# Patient Record
Sex: Male | Born: 1935
Health system: Southern US, Community
[De-identification: ages and names within clinical notes are randomized; demographics above are authoritative.]

## PROBLEM LIST (undated history)

## (undated) DIAGNOSIS — J189 Pneumonia, unspecified organism: Secondary | ICD-10-CM

## (undated) DIAGNOSIS — Z87442 Personal history of urinary calculi: Secondary | ICD-10-CM

## (undated) DIAGNOSIS — E785 Hyperlipidemia, unspecified: Secondary | ICD-10-CM

## (undated) DIAGNOSIS — Z860100 Personal history of colon polyps, unspecified: Secondary | ICD-10-CM

## (undated) DIAGNOSIS — I341 Nonrheumatic mitral (valve) prolapse: Secondary | ICD-10-CM

## (undated) DIAGNOSIS — I340 Nonrheumatic mitral (valve) insufficiency: Secondary | ICD-10-CM

## (undated) DIAGNOSIS — E7800 Pure hypercholesterolemia, unspecified: Secondary | ICD-10-CM

## (undated) DIAGNOSIS — Z8719 Personal history of other diseases of the digestive system: Secondary | ICD-10-CM

## (undated) DIAGNOSIS — C801 Malignant (primary) neoplasm, unspecified: Secondary | ICD-10-CM

## (undated) DIAGNOSIS — Z951 Presence of aortocoronary bypass graft: Secondary | ICD-10-CM

## (undated) DIAGNOSIS — R0602 Shortness of breath: Secondary | ICD-10-CM

## (undated) DIAGNOSIS — Z8701 Personal history of pneumonia (recurrent): Secondary | ICD-10-CM

## (undated) DIAGNOSIS — R3915 Urgency of urination: Secondary | ICD-10-CM

## (undated) DIAGNOSIS — I2510 Atherosclerotic heart disease of native coronary artery without angina pectoris: Secondary | ICD-10-CM

## (undated) DIAGNOSIS — E041 Nontoxic single thyroid nodule: Secondary | ICD-10-CM

## (undated) DIAGNOSIS — Z889 Allergy status to unspecified drugs, medicaments and biological substances status: Secondary | ICD-10-CM

## (undated) DIAGNOSIS — H269 Unspecified cataract: Secondary | ICD-10-CM

## (undated) DIAGNOSIS — R011 Cardiac murmur, unspecified: Secondary | ICD-10-CM

## (undated) DIAGNOSIS — I493 Ventricular premature depolarization: Secondary | ICD-10-CM

## (undated) DIAGNOSIS — M1990 Unspecified osteoarthritis, unspecified site: Secondary | ICD-10-CM

## (undated) DIAGNOSIS — K5790 Diverticulosis of intestine, part unspecified, without perforation or abscess without bleeding: Secondary | ICD-10-CM

## (undated) DIAGNOSIS — S63006A Unspecified dislocation of unspecified wrist and hand, initial encounter: Secondary | ICD-10-CM

## (undated) DIAGNOSIS — K219 Gastro-esophageal reflux disease without esophagitis: Secondary | ICD-10-CM

## (undated) DIAGNOSIS — R350 Frequency of micturition: Secondary | ICD-10-CM

## (undated) DIAGNOSIS — I509 Heart failure, unspecified: Secondary | ICD-10-CM

## (undated) DIAGNOSIS — Z9889 Other specified postprocedural states: Secondary | ICD-10-CM

## (undated) DIAGNOSIS — H02839 Dermatochalasis of unspecified eye, unspecified eyelid: Secondary | ICD-10-CM

## (undated) DIAGNOSIS — Z961 Presence of intraocular lens: Secondary | ICD-10-CM

## (undated) HISTORY — DX: Ventricular premature depolarization: I49.3

## (undated) HISTORY — DX: Unspecified dislocation of unspecified wrist and hand, initial encounter: S63.006A

## (undated) HISTORY — DX: Malignant (primary) neoplasm, unspecified: C80.1

## (undated) HISTORY — DX: Presence of intraocular lens: Z96.1

## (undated) HISTORY — DX: Nonrheumatic mitral (valve) insufficiency: I34.0

## (undated) HISTORY — DX: Heart failure, unspecified: I50.9

## (undated) HISTORY — PX: CATARACT EXTRACTION: SUR2

## (undated) HISTORY — PX: ESOPHAGOGASTRODUODENOSCOPY: SHX1529

## (undated) HISTORY — PX: BUNIONECTOMY: SHX129

## (undated) HISTORY — DX: Hyperlipidemia, unspecified: E78.5

## (undated) HISTORY — DX: Nonrheumatic mitral (valve) prolapse: I34.1

## (undated) HISTORY — DX: Personal history of pneumonia (recurrent): Z87.01

## (undated) HISTORY — PX: OTHER SURGICAL HISTORY: SHX169

## (undated) HISTORY — PX: HERNIA REPAIR: SHX51

## (undated) HISTORY — PX: LITHOTRIPSY: SUR834

## (undated) HISTORY — DX: Atherosclerotic heart disease of native coronary artery without angina pectoris: I25.10

## (undated) HISTORY — DX: Pure hypercholesterolemia, unspecified: E78.00

## (undated) HISTORY — DX: Cardiac murmur, unspecified: R01.1

## (undated) HISTORY — PX: CYSTOSCOPY: SUR368

## (undated) HISTORY — DX: Dermatochalasis of unspecified eye, unspecified eyelid: H02.839

## (undated) HISTORY — PX: COLONOSCOPY: SHX174

## (undated) HISTORY — DX: Unspecified cataract: H26.9

---

## 1982-01-18 HISTORY — PX: PARTIAL COLECTOMY: SHX5273

## 1982-01-18 HISTORY — PX: OTHER SURGICAL HISTORY: SHX169

## 2011-08-10 DIAGNOSIS — I2510 Atherosclerotic heart disease of native coronary artery without angina pectoris: Secondary | ICD-10-CM | POA: Insufficient documentation

## 2011-08-10 HISTORY — PX: CARDIAC CATHETERIZATION: SHX172

## 2011-08-10 HISTORY — DX: Atherosclerotic heart disease of native coronary artery without angina pectoris: I25.10

## 2011-08-12 ENCOUNTER — Encounter: Payer: Medicare Other | Admitting: Thoracic Surgery (Cardiothoracic Vascular Surgery)

## 2011-08-12 ENCOUNTER — Other Ambulatory Visit: Payer: Self-pay

## 2011-08-12 ENCOUNTER — Institutional Professional Consult (permissible substitution) (INDEPENDENT_AMBULATORY_CARE_PROVIDER_SITE_OTHER): Payer: Medicare Other | Admitting: Thoracic Surgery (Cardiothoracic Vascular Surgery)

## 2011-08-12 VITALS — BP 120/70 | HR 79 | Resp 18 | Ht 68.0 in | Wt 166.0 lb

## 2011-08-12 DIAGNOSIS — I059 Rheumatic mitral valve disease, unspecified: Secondary | ICD-10-CM

## 2011-08-12 DIAGNOSIS — E7800 Pure hypercholesterolemia, unspecified: Secondary | ICD-10-CM | POA: Insufficient documentation

## 2011-08-12 DIAGNOSIS — I340 Nonrheumatic mitral (valve) insufficiency: Secondary | ICD-10-CM | POA: Insufficient documentation

## 2011-08-12 DIAGNOSIS — I2510 Atherosclerotic heart disease of native coronary artery without angina pectoris: Secondary | ICD-10-CM

## 2011-08-12 DIAGNOSIS — Z961 Presence of intraocular lens: Secondary | ICD-10-CM | POA: Insufficient documentation

## 2011-08-12 DIAGNOSIS — R011 Cardiac murmur, unspecified: Secondary | ICD-10-CM | POA: Insufficient documentation

## 2011-08-12 DIAGNOSIS — I493 Ventricular premature depolarization: Secondary | ICD-10-CM | POA: Insufficient documentation

## 2011-08-12 DIAGNOSIS — H02839 Dermatochalasis of unspecified eye, unspecified eyelid: Secondary | ICD-10-CM | POA: Insufficient documentation

## 2011-08-12 DIAGNOSIS — N200 Calculus of kidney: Secondary | ICD-10-CM | POA: Insufficient documentation

## 2011-08-12 DIAGNOSIS — H269 Unspecified cataract: Secondary | ICD-10-CM | POA: Insufficient documentation

## 2011-08-12 DIAGNOSIS — I509 Heart failure, unspecified: Secondary | ICD-10-CM | POA: Insufficient documentation

## 2011-08-12 MED ORDER — AMIODARONE HCL 200 MG PO TABS: 200.0000 mg | ORAL_TABLET | Freq: Two times a day (BID) | ORAL | Status: DC

## 2011-08-12 NOTE — Patient Instructions (Signed)
Begin amiodarone 200 mg by mouth twice daily

## 2011-08-12 NOTE — Progress Notes (Signed)
[address]            [place] [address]          [phone]     CARDIOTHORACIC SURGERY CONSULTATION REPORT  Referring Provider is Theodoro Grist, MD PCP is Arletta Bale, MD  Chief Complaint  Patient presents with  . Mitral Regurgitation    Referral from Dr. 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 The patient has been relatively physically active until recently.  Several weeks ago the patient developed relatively sudden onset of exertional shortness of breath and orthopnea associated with dry nonproductive cough. These symptoms became quite severe, prompting him to go for evaluation at [facility]. Chest x-ray was performed demonstrating bilateral pleural effusions and opacity consistent with congestive heart failure versus pneumonia. The patient was treated for presumed pneumonia but seen in followup by his primary care physician where he was noted to have a loud murmur on physical exam. He was referred to Dr. Rhona Leavens who performed transthoracic echocardiogram on 08/05/2011. By report this demonstrated severe prolapse of the posterior leaflet of the mitral valve with severe mitral regurgitation and severe left atrial enlargement. There is normal left ventricular systolic function. The patient was started on medical therapy for congestive heart failure, and his symptoms have improved somewhat. However, the patient  Continues to experience exertional shortness of breath and orthopnea.  The patient subsequently underwent both transesophageal echocardiogram and left and right heart catheterization on 08/10/2011. These confirmed the presence of mitral valve prolapse with flail segment of the posterior leaflet of the mitral valve and severe mitral regurgitation. The patient  was found to have "moderate" two-vessel coronary artery disease. The patient has now been referred for possible surgical intervention.  Past Medical History  Diagnosis Date  . CHF (congestive heart failure)   . MR (mitral regurgitation)   . MVP (mitral valve prolapse)   . Cataract   . Dermatochalasis   . Dyslipidemia   . Hypercholesterolemia   . Systolic murmur   . Nephrolithiasis     Left Kidney  . Hx: recurrent pneumonia   . PVC (premature ventricular contraction)   . Pseudophakia   . Cancer     colon cancer 1984    Past Surgical History  Procedure Date  . Bunionectomy   . Cataract extraction     Left  . Cystoscopy     with manipulation of Ureteral Calculus  . Lithotripsy     Whole body extracorporeal shock wave  . Lithotripsy     Left ESL PUA GPE 01/03/2006,04/03/2006  . Partial colecotomy 1984    Family History  Problem Relation Age of Onset  . Heart disease Father   . Cancer Mother   . Glaucoma Sister   . Prostate cancer Other     Fraternal HX     Social History History  Substance Use Topics  . Smoking status: Former Smoker    Types: Cigarettes  . Smokeless tobacco: Not on file  . Alcohol Use: Not on file    Current Outpatient Prescriptions  Medication Sig Dispense Refill  . carvedilol (COREG) 3.125 MG tablet Take 3.125 mg by mouth 2 (two) times daily with a meal.      .  furosemide (LASIX) 40 MG tablet Take 40 mg by mouth daily.      . Multiple Vitamin (MULTIVITAMIN) capsule Take 1 capsule by mouth daily.      . niacin 500 MG tablet Take 500 mg by mouth 2 (two) times daily with a meal.      . potassium chloride SA (K-DUR,KLOR-CON) 20 MEQ tablet Take 20 mEq by mouth 2 (two) times daily.        Allergies  Allergen Reactions  . Ivp Dye (Iodinated Diagnostic Agents) Rash     Review of Systems:   General:  good appetite, less energy, no recent weight gain/loss  HEENT:  no blurry vision, no recent vision changes, no epistaxis, no hearing loss, no  loose teeth or painful teeth, no dentures, last saw dentist 6 months ago  Respiratory:  + shortness of breath, he had a dry cough initially which has improved, no wheezing, no hemoptysis, no pain with inspiration or cough  Cardiac:  no chest pain with/without exertion, SOB with mild exertion, has very mild resting SOB, no PND, + orthopnea, no LE edema, some palpitations, some arrhythmia, no dizzy spells, no syncope  Vascular:  no pain suggestive of claudication  GI:   no difficulty swallowing, mild heartburn/reflux, no hematochezia, no hematemesis, no melena, no constipation, no diarrhea   GU:   has dysuria, no urgency, no frequency  Musculoskeletal: mild arthritis in his hands, no myalgias, mobility is good, no difficulty walking  Neuro:   no symptoms suggestive of TIA's, no seizures, no headaches, no peripheral neuropathy, no instability of gait, no memory/cognitive dysfunction  Endocrine:  no diabetes, no thyroid dysfunction  Hematologic:  no easy bruising, no anemia, no abnormal bleeding  Psych:   no anxiety, no depression     Physical Exam:   BP 120/70  Pulse 79  Resp 18  Ht 5\' 8"  (1.727 m)  Wt 166 lb (75.297 kg)  BMI 25.24 kg/m2  SpO2 93%  General:  Patient is  well-appearing  HEENT:  Unremarkable   Neck:   no JVD, no bruits, no adenopathy   Chest:   clear to auscultation, symmetrical breath sounds, no wheezes, no rhonchi  CV:   RRR, very loud Systolic murmur  Abdomen:  soft, non-tender, no masses   Extremities:  warm, well-perfused, pulses normal, no LE edema  Rectal/GU  Deferred  Neuro:   Grossly non-focal and symmetrical throughout  Skin:   Clean and dry, no rashes, no breakdown   Diagnostic Tests:  Transesophageal Echocardiogram  Transesophageal echocardiogram performed 08/10/2011 is reviewed. This demonstrates flail segment of the posterior leaflet of the mitral valve with severe (4+) mitral regurgitation. Findings are consistent with fibroelastic deficiency type  degenerative disease.  The jet of regurgitation courses anteriorly around the left atrium. Left atrium is severely dilated. There is normal left ventricular systolic function with mild left ventricular chamber enlargement. There is trace aortic insufficiency. There is mild tricuspid regurgitation. No other significant abnormalities are noted.  Cardiac Catheterization  Left and right heart catheterization performed 08/10/2011 is reviewed. There is two-vessel coronary artery disease with somewhat ulcerated 70% stenosis of the mid right coronary artery and 60-70% stenosis of the proximal right coronary artery. There is right dominant course circulation. There is fairly discrete 70% stenosis of the mid left anterior descending coronary artery arising at the takeoff of a fairly large diagonal branch. There is no significant disease in the left circumflex territory. Pulmonary artery pressures measured 56/24 with mean pulmonary catheter wedge pressure  29 and V waves to 52 mm mercury. Central venous pressure was 8.  Resting cardiac output ranged between 4.6 and 5.3 L per minute corresponding to a cardiac index between 2.5 and 2.8.  Impression:  Patient has severe symptomatic mitral regurgitation with mitral valve prolapse involving flail posterior leaflet of the mitral valve. Left ventricular systolic function is preserved. There is moderate pulmonary hypertention. Based upon review of the patient's transesophageal echocardiogram, I feel there is a very high likelihood that his valve should be repairable.  The patient also has significant two-vessel coronary artery disease involving left anterior descending coronary artery and the right coronary artery. Although the coronary artery disease is not critical, I believe the severity of stenosis is significant and probably would best be treated using concomitant coronary artery bypass grafting at the time of mitral valve repair.   Plan:  The rationale for elective  mitral valve repair surgery has been explained, including a comparison between surgery and continued medical therapy with close follow-up.  The likelihood of successful and durable valve repair has been discussed with particular reference to the findings of their recent echocardiogram.  Based upon these findings and previous experience, I have quoted them a greater than 95 percent likelihood of successful valve repair.  In the unlikely event that their valve cannot be successfully repaired, we discussed the possibility of replacing the mitral valve using a mechanical prosthesis with the attendant need for long-term anticoagulation versus the alternative of replacing it using a bioprosthetic tissue valve with its potential for late structural valve deterioration and failure, depending upon the patient's longevity.  The patient specifically requests that if the mitral valve must be replaced that it be done using a bioprosthetic tissue valve.  Alternative surgical approaches have been discussed, including a comparison between conventional sternotomy and minimally-invasive techniques.  The relative risks and benefits of each have been reviewed as they pertain to the patient's specific circumstances, and all of their questions have been addressed.  We discussed alternative approaches related to the patient's coronary artery disease as well including observation versus percutaneous coronary intervention prior to or following mitral valve repair versus concomitant coronary artery bypass grafting. After considerable discussion the patient specifically requests that he undergo coronary artery bypass grafting at the time of mitral valve repair. I feel this is likely the most conservative and appropriate plan under the circumstances. All of his questions been addressed. We tentatively plan for surgery on Thursday, August 1. I've given the patient prescription for amiodarone to begin today to decrease his risk of preoperative  atrial arrhythmias.     Salvatore Decent. Cornelius Moras, MD 08/12/2011 1:24 PM

## 2011-08-13 ENCOUNTER — Encounter (HOSPITAL_COMMUNITY): Payer: Self-pay | Admitting: Pharmacy Technician

## 2011-08-13 NOTE — H&P (Addendum)
CARDIOTHORACIC SURGERY HISTORY AND PHYSICAL EXAM  Referring Provider is Theodoro Grist, MD PCP is Arletta Bale, MD    Chief Complaint   Patient presents with   .  Mitral Regurgitation       Referral from Dr. 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 The patient has been relatively physically active until recently.  Several weeks ago the patient developed relatively sudden onset of exertional shortness of breath and orthopnea associated with dry nonproductive cough. These symptoms became quite severe, prompting him to go for evaluation at [facility]. Chest x-ray was performed demonstrating bilateral pleural effusions and opacity consistent with congestive heart failure versus pneumonia. The patient was treated for presumed pneumonia but seen in followup by his primary care physician where he was noted to have a loud murmur on physical exam. He was referred to Dr. Rhona Leavens who performed transthoracic echocardiogram on 08/05/2011. By report this demonstrated severe prolapse of the posterior leaflet of the mitral valve with severe mitral regurgitation and severe left atrial enlargement. There is normal left ventricular systolic function. The patient was started on medical therapy for congestive heart failure, and his symptoms have improved somewhat. However, the patient  Continues to experience exertional shortness of breath and orthopnea.  The patient subsequently underwent both transesophageal echocardiogram and left and right heart catheterization on 08/10/2011. These confirmed the presence of mitral valve prolapse with flail segment of the posterior leaflet of the mitral valve and severe mitral regurgitation. The patient was found to have "moderate" two-vessel coronary artery disease. The patient has  now been referred for possible surgical intervention.     Past Medical History   Diagnosis  Date   .  CHF (congestive heart failure)     .  MR (mitral regurgitation)     .  MVP (mitral valve prolapse)     .  Cataract     .  Dermatochalasis     .  Dyslipidemia     .  Hypercholesterolemia     .  Systolic murmur     .  Nephrolithiasis         Left Kidney   .  Hx: recurrent pneumonia     .  PVC (premature ventricular contraction)     .  Pseudophakia     .  Cancer         colon cancer 1984       Past Surgical History   Procedure  Date   .  Bunionectomy     .  Cataract extraction         Left   .  Cystoscopy         with manipulation of Ureteral Calculus   .  Lithotripsy         Whole body extracorporeal shock wave   .  Lithotripsy         Left ESL PUA GPE 01/03/2006,04/03/2006   .  Partial colecotomy  1984       Family History   Problem  Relation  Age of Onset   .  Heart disease  Father     .  Cancer  Mother     .  Glaucoma  Sister     .  Prostate cancer  Other         Fraternal HX      Social History History   Substance Use Topics   .  Smoking status:  Former Smoker       Types:  Cigarettes   .  Smokeless tobacco:  Not on file   .  Alcohol Use:  Not on file       Current Outpatient Prescriptions   Medication  Sig  Dispense  Refill   .  carvedilol (COREG) 3.125 MG tablet  Take 3.125 mg by mouth 2 (two) times daily with a meal.         .  furosemide (LASIX) 40 MG tablet  Take 40 mg by mouth daily.         .  Multiple Vitamin (MULTIVITAMIN) capsule  Take 1 capsule by mouth daily.         .  niacin 500 MG tablet  Take 500 mg by mouth 2 (two) times daily with a meal.         .  potassium chloride SA (K-DUR,KLOR-CON) 20 MEQ tablet  Take 20 mEq by mouth 2 (two) times daily.             Allergies   Allergen  Reactions   .  Ivp Dye (Iodinated Diagnostic Agents)  Rash      Review of Systems:              General:                      good appetite, less  energy, no recent weight gain/loss             HEENT:                       no blurry vision, no recent vision changes, no epistaxis, no hearing loss, no loose teeth or painful teeth, no dentures, last saw dentist 6 months ago             Respiratory:                + shortness of breath, he had a dry cough initially which has improved, no wheezing, no hemoptysis, no pain with inspiration or cough             Cardiac:                      no chest pain with/without exertion, SOB with mild exertion, has very mild resting SOB, no PND, + orthopnea, no LE edema, some palpitations, some arrhythmia, no dizzy spells, no syncope             Vascular:                     no pain suggestive of claudication             GI:                                no difficulty swallowing, mild heartburn/reflux, no hematochezia, no hematemesis, no melena, no constipation, no diarrhea               GU:                              has dysuria, no  urgency, no frequency             Musculoskeletal:         mild arthritis in his hands, no myalgias, mobility is good, no difficulty walking             Neuro:                         no symptoms suggestive of TIA's, no seizures, no headaches, no peripheral neuropathy, no instability of gait, no memory/cognitive dysfunction             Endocrine:                   no diabetes, no thyroid dysfunction             Hematologic:               no easy bruising, no anemia, no abnormal bleeding             Psych:                         no anxiety, no depression                           Physical Exam:              BP 120/70  Pulse 79  Resp 18  Ht 5\' 8"  (1.727 m)  Wt 166 lb (75.297 kg)  BMI 25.24 kg/m2  SpO2 93%             General:                      Patient is  well-appearing             HEENT:                       Unremarkable               Neck:                           no JVD, no bruits, no adenopathy               Chest:                         clear to auscultation,  symmetrical breath sounds, no wheezes, no rhonchi             CV:                              RRR, very loud Systolic murmur             Abdomen:                    soft, non-tender, no masses               Extremities:                 warm, well-perfused, pulses normal, no LE edema             Rectal/GU                   Deferred  Neuro:                         Grossly non-focal and symmetrical throughout             Skin:                            Clean and dry, no rashes, no breakdown   Diagnostic Tests:  Transesophageal Echocardiogram  Transesophageal echocardiogram performed 08/10/2011 is reviewed. This demonstrates flail segment of the posterior leaflet of the mitral valve with severe (4+) mitral regurgitation. Findings are consistent with fibroelastic deficiency type degenerative disease.  The jet of regurgitation courses anteriorly around the left atrium. Left atrium is severely dilated. There is normal left ventricular systolic function with mild left ventricular chamber enlargement. There is trace aortic insufficiency. There is mild tricuspid regurgitation. No other significant abnormalities are noted.  Cardiac Catheterization  Left and right heart catheterization performed 08/10/2011 is reviewed. There is two-vessel coronary artery disease with somewhat ulcerated 70% stenosis of the mid right coronary artery and 60-70% stenosis of the proximal right coronary artery. There is right dominant course circulation. There is fairly discrete 70% stenosis of the mid left anterior descending coronary artery arising at the takeoff of a fairly large diagonal branch. There is no significant disease in the left circumflex territory. Pulmonary artery pressures measured 56/24 with mean pulmonary catheter wedge pressure 29 and V waves to 52 mm mercury. Central venous pressure was 8.  Resting cardiac output ranged between 4.6 and 5.3 L per minute corresponding to a cardiac index between 2.5  and 2.8.  Impression:  Patient has severe symptomatic mitral regurgitation with mitral valve prolapse involving flail posterior leaflet of the mitral valve. Left ventricular systolic function is preserved. There is moderate pulmonary hypertention. Based upon review of the patient's transesophageal echocardiogram, I feel there is a very high likelihood that his valve should be repairable.  The patient also has significant two-vessel coronary artery disease involving left anterior descending coronary artery and the right coronary artery. Although the coronary artery disease is not critical, I believe the severity of stenosis is significant and probably would best be treated using concomitant coronary artery bypass grafting at the time of mitral valve repair.   Plan:  The rationale for elective mitral valve repair surgery has been explained, including a comparison between surgery and continued medical therapy with close follow-up.  The likelihood of successful and durable valve repair has been discussed with particular reference to the findings of their recent echocardiogram.  Based upon these findings and previous experience, I have quoted them a greater than 95 percent likelihood of successful valve repair.  In the unlikely event that their valve cannot be successfully repaired, we discussed the possibility of replacing the mitral valve using a mechanical prosthesis with the attendant need for long-term anticoagulation versus the alternative of replacing it using a bioprosthetic tissue valve with its potential for late structural valve deterioration and failure, depending upon the patient's longevity.  The patient specifically requests that if the mitral valve must be replaced that it be done using a bioprosthetic tissue valve.  Alternative surgical approaches have been discussed, including a comparison between conventional sternotomy and minimally-invasive techniques.  The relative risks and benefits of  each have been reviewed as they pertain to the patient's specific circumstances, and all of their questions have been addressed.  We discussed alternative approaches related to the  patient's coronary artery disease as well including observation versus percutaneous coronary intervention prior to or following mitral valve repair versus concomitant coronary artery bypass grafting. After considerable discussion the patient specifically requests that he undergo coronary artery bypass grafting at the time of mitral valve repair. I feel this is likely the most conservative and appropriate plan under the circumstances. All of his questions been addressed. We tentatively plan for surgery on Thursday, August 1. I've given the patient prescription for amiodarone to decrease his risk of preoperative atrial arrhythmias.     Salvatore Decent. Cornelius Moras, MD 08/12/2011 1:24 PM     Preoperative chest x-ray:  *RADIOLOGY REPORT*   Clinical Data: Preop made for valve replacement.  Mitral valve repair.   CHEST - 2 VIEW   Comparison: None.   Findings: Midline trachea.  Normal heart size.  There is minimal right-sided pleural fluid or thickening.  Mild adjacent volume loss at the right lung base.   No pneumothorax.  Left lung is clear.  Apparent increased density at the right cardiophrenic angle on the frontal may relate to volume loss in the right lung base.   IMPRESSION: Trace right pleural fluid or thickening with adjacent volume loss in the right hemithorax. Increased density at the medial right lung base on the frontal could be secondary.  However, an underlying mass cannot be excluded.  Recommend further characterization with contrast enhanced chest CT.  If there are remote prior radiographs available for comparison, these could alternatively be reviewed.   These results will be called to the ordering clinician or representative by the Radiologist Assistant, and communication documented in the PACS  Dashboard.   Original Report Authenticated By: Consuello Bossier, M.D.    This prompted follow up CT scan:  *RADIOLOGY REPORT*   Clinical Data: Abnormal chest radiograph   CT CHEST WITH CONTRAST   Technique:  Multidetector CT imaging of the chest was performed following the standard protocol during bolus administration of intravenous contrast.   Contrast: 75mL OMNIPAQUE IOHEXOL 300 MG/ML  SOLN   Comparison: Chest radiographs dated 08/17/2011   Findings: Moderate right and trace left pleural effusions. Associated mild right lower lobe atelectasis.  No suspicious pulmonary nodules.  No pneumothorax.   3.1 cm left thyroid nodule.   The heart is normal in size.  No pericardial effusion.  Coronary atherosclerosis.  Atherosclerotic calcifications of the aortic arch.  SVC appears patent.   No suspicious mediastinal, hilar, or axillary lymphadenopathy.   Visualized upper abdomen is notable for geographic increased perfusion in the anterior liver (series 3/image 60), likely reflecting a transient perfusion anomaly.  Small hiatal hernia.   Mild degenerative changes of the visualized thoracolumbar spine.   IMPRESSION: Moderate right pleural effusion with associated mild right lower lobe atelectasis, corresponding to the suspected radiographic abnormality.   Trace left pleural effusion.   3.1 cm left thyroid nodule.  Correlate with nonemergent thyroid ultrasound (if not previously performed).   Original Report Authenticated By: Charline Bills, M.D.

## 2011-08-16 NOTE — Pre-Procedure Instructions (Signed)
20 [name]  08/16/2011   Your procedure is scheduled on:  Thurs, Aug 1 @ 7:30 AM  Report to [facility] at 5:30 AM.  Call this number if you have problems the morning of surgery: [phone]   Remember:   Do not eat food:After Midnight.    Take these medicines the morning of surgery with A SIP OF WATER: Amiodarone(Pacerone) and Coreg(Carvedilol)   Do not wear jewelry  Do not wear lotions, powders, or colognes  Do not shave 48 hours prior to surgery.   Do not bring valuables to the hospital.  Contacts, dentures or bridgework may not be worn into surgery.  Leave suitcase in the car. After surgery it may be brought to your room.  For patients admitted to the hospital, checkout time is 11:00 AM the day of discharge.   Patients discharged the day of surgery will not be allowed to drive home.    Special Instructions: CHG Shower Use Special Wash: 1/2 bottle night before surgery and 1/2 bottle morning of surgery.   Please read over the following fact sheets that you were given: Pain Booklet, Coughing and Deep Breathing, Blood Transfusion Information, Open Heart Packet, MRSA Information and Surgical Site Infection Prevention

## 2011-08-17 ENCOUNTER — Telehealth: Payer: Self-pay

## 2011-08-17 ENCOUNTER — Encounter (HOSPITAL_COMMUNITY)
Admission: RE | Admit: 2011-08-17 | Discharge: 2011-08-17 | Disposition: A | Discharge status: Home / Self Care | Payer: Medicare Other | Source: Ambulatory Visit | Attending: Thoracic Surgery (Cardiothoracic Vascular Surgery) | Admitting: Thoracic Surgery (Cardiothoracic Vascular Surgery)

## 2011-08-17 ENCOUNTER — Ambulatory Visit (HOSPITAL_COMMUNITY)
Admission: RE | Admit: 2011-08-17 | Discharge: 2011-08-17 | Disposition: A | Discharge status: Home / Self Care | Payer: Medicare Other | Source: Ambulatory Visit | Attending: Thoracic Surgery (Cardiothoracic Vascular Surgery) | Admitting: Thoracic Surgery (Cardiothoracic Vascular Surgery)

## 2011-08-17 ENCOUNTER — Inpatient Hospital Stay (HOSPITAL_COMMUNITY)
Admission: RE | Admit: 2011-08-17 | Discharge: 2011-08-17 | Disposition: A | Discharge status: Home / Self Care | Payer: Medicare Other | Source: Ambulatory Visit | Attending: Thoracic Surgery (Cardiothoracic Vascular Surgery) | Admitting: Thoracic Surgery (Cardiothoracic Vascular Surgery)

## 2011-08-17 ENCOUNTER — Other Ambulatory Visit: Payer: Self-pay

## 2011-08-17 ENCOUNTER — Encounter (HOSPITAL_COMMUNITY): Payer: Self-pay

## 2011-08-17 VITALS — BP 123/83 | HR 73 | Temp 98.3°F | Resp 20 | Ht 68.0 in | Wt 160.1 lb

## 2011-08-17 DIAGNOSIS — R0602 Shortness of breath: Secondary | ICD-10-CM | POA: Insufficient documentation

## 2011-08-17 DIAGNOSIS — Z01810 Encounter for preprocedural cardiovascular examination: Secondary | ICD-10-CM

## 2011-08-17 DIAGNOSIS — I059 Rheumatic mitral valve disease, unspecified: Secondary | ICD-10-CM

## 2011-08-17 DIAGNOSIS — I2510 Atherosclerotic heart disease of native coronary artery without angina pectoris: Secondary | ICD-10-CM

## 2011-08-17 DIAGNOSIS — T50995A Adverse effect of other drugs, medicaments and biological substances, initial encounter: Secondary | ICD-10-CM

## 2011-08-17 DIAGNOSIS — R9389 Abnormal findings on diagnostic imaging of other specified body structures: Secondary | ICD-10-CM

## 2011-08-17 DIAGNOSIS — Z01818 Encounter for other preprocedural examination: Secondary | ICD-10-CM | POA: Insufficient documentation

## 2011-08-17 DIAGNOSIS — Z01812 Encounter for preprocedural laboratory examination: Secondary | ICD-10-CM | POA: Insufficient documentation

## 2011-08-17 HISTORY — DX: Allergy status to unspecified drugs, medicaments and biological substances: Z88.9

## 2011-08-17 HISTORY — DX: Frequency of micturition: R35.0

## 2011-08-17 HISTORY — DX: Shortness of breath: R06.02

## 2011-08-17 HISTORY — DX: Personal history of colonic polyps: Z86.010

## 2011-08-17 HISTORY — DX: Diverticulosis of intestine, part unspecified, without perforation or abscess without bleeding: K57.90

## 2011-08-17 HISTORY — DX: Personal history of colon polyps, unspecified: Z86.0100

## 2011-08-17 HISTORY — DX: Urgency of urination: R39.15

## 2011-08-17 HISTORY — DX: Personal history of other diseases of the digestive system: Z87.19

## 2011-08-17 HISTORY — DX: Unspecified osteoarthritis, unspecified site: M19.90

## 2011-08-17 HISTORY — DX: Pneumonia, unspecified organism: J18.9

## 2011-08-17 HISTORY — DX: Personal history of urinary calculi: Z87.442

## 2011-08-17 LAB — COMPREHENSIVE METABOLIC PANEL
Albumin: 3.7 g/dL (ref 3.5–5.2)
Alkaline Phosphatase: 110 U/L (ref 39–117)
BUN: 22 mg/dL (ref 6–23)
CO2: 25 mEq/L (ref 19–32)
Chloride: 100 mEq/L (ref 96–112)
GFR calc non Af Amer: 67 mL/min — ABNORMAL LOW (ref >90)
Potassium: 3.8 mEq/L (ref 3.5–5.1)
Total Bilirubin: 0.6 mg/dL (ref 0.3–1.2)

## 2011-08-17 LAB — BLOOD GAS, ARTERIAL
Acid-Base Excess: 3.1 mmol/L — ABNORMAL HIGH (ref 0.0–2.0)
Bicarbonate: 27.1 mEq/L — ABNORMAL HIGH (ref 20.0–24.0)
TCO2: 28.4 mmol/L (ref 0–100)
pCO2 arterial: 41.3 mmHg (ref 35.0–45.0)
pH, Arterial: 7.433 (ref 7.350–7.450)
pO2, Arterial: 83.3 mmHg (ref 80.0–100.0)

## 2011-08-17 LAB — CBC
HCT: 42.4 % (ref 39.0–52.0)
Hemoglobin: 14 g/dL (ref 13.0–17.0)
MCV: 90.4 fL (ref 78.0–100.0)
RBC: 4.69 MIL/uL (ref 4.22–5.81)
RDW: 13.8 % (ref 11.5–15.5)
WBC: 10.3 10*3/uL (ref 4.0–10.5)

## 2011-08-17 LAB — PULMONARY FUNCTION TEST

## 2011-08-17 LAB — HEMOGLOBIN A1C
Hgb A1c MFr Bld: 6.1 % — ABNORMAL HIGH (ref <5.7)
Mean Plasma Glucose: 128 mg/dL — ABNORMAL HIGH (ref <117)

## 2011-08-17 LAB — PROTIME-INR
INR: 1.03 (ref 0.00–1.49)
Prothrombin Time: 13.7 seconds (ref 11.6–15.2)

## 2011-08-17 LAB — URINALYSIS, ROUTINE W REFLEX MICROSCOPIC
Bilirubin Urine: NEGATIVE
Glucose, UA: NEGATIVE mg/dL
Hgb urine dipstick: NEGATIVE
Ketones, ur: NEGATIVE mg/dL
Protein, ur: NEGATIVE mg/dL

## 2011-08-17 LAB — TYPE AND SCREEN: ABO/RH(D): B NEG

## 2011-08-17 MED ORDER — ALBUTEROL SULFATE (5 MG/ML) 0.5% IN NEBU
2.5000 mg | INHALATION_SOLUTION | Freq: Once | RESPIRATORY_TRACT | Status: AC
  Administered 2011-08-17: 2.5 mg via RESPIRATORY_TRACT

## 2011-08-17 MED ORDER — CHLORHEXIDINE GLUCONATE 4 % EX LIQD: 30.0000 mL | CUTANEOUS | Status: DC

## 2011-08-17 MED ORDER — PREDNISONE 50 MG PO TABS: ORAL_TABLET | ORAL | Status: DC

## 2011-08-17 NOTE — Telephone Encounter (Signed)
Pt aware of Chest CT appt time and RX called to pharm.

## 2011-08-17 NOTE — Progress Notes (Signed)
Confirmed with pt PFT is @ 1000 Dopplers @ 1100

## 2011-08-17 NOTE — Consult Note (Signed)
Anesthesia chart review: Patient is a 78 year old male scheduled for mitral valve repair and coronary artery bypass grafting by Dr. Cornelius Moras on 08/19/2011. He has mitral valve prolapse with well segment of the posterior leaflet of the mitral valve and severe mitral regurgitation. Cardiac cath showed moderate two-vessel CAD.  Additional history includes CHF, former smoker, colon cancer, and dyslipidemia.  PCP is Dr. Arletta Bale.  Cardiologist is Dr. Theodoro Grist in [facility].  According to Dr. 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  CXR on 08/17/11 showed: Trace right pleural fluid or thickening with adjacent volume loss in the right hemithorax. Increased density at the medial right lung base on the frontal could be secondary. However, an underlying mass cannot be excluded. Recommend further characterization with contrast enhanced chest CT. If there are remote prior radiographs available for comparison, these could alternatively be reviewed.  Dr. Cornelius Moras has arranged for patient to undergo a CT of the chest with contrast on 08/18/11 at 1600.  EKG on 08/17/11 showed SR with sinus arrhythmia, first degree AVB, low voltage QRS, incomplete left BBB.    Cardiac cath from [facility]) on 08/10/11 showed severe MR, normal LV systolic function, EF 55%, moderate RCA and LAD stenosis, mild to moderate pulmonary HTN.  By notes, he had a TEE on 08/10/11 that showed "flail segment of the posterior leaflet of the mitral valve with severe (4+) mitral regurgitation. Findings are consistent with fibroelastic deficiency type degenerative disease. The jet of regurgitation courses anteriorly around the left atrium. Left atrium is severely dilated. There is normal left ventricular systolic function with mild left ventricular chamber enlargement. There is trace aortic insufficiency. There is mild tricuspid regurgitation. No other significant abnormalities are noted."  This  report has been requested from [facility].  2D echo on 08/05/11 showed normal LV systolic function, EF 65%, severe LA enlargement, severe prolapse of the posterior MV leaflet, severe anteriorly directed MR, mild AR, mild TR with moderate pulmonary HTN.  Labs noted.    Definitve plan per Dr. Cornelius Moras once he reviews chest CT.  Anticipate he should be able to proceed from an Anesthesia standpoint.    Shonna Chock, PA-C 08/17/11 1715

## 2011-08-17 NOTE — Progress Notes (Signed)
Pre-op Cardiac Surgery  Carotid Findings:  Bilaterally no significant ICA stenosis with antegrade vertebral flow.   Upper Extremity Right Left  Brachial Pressures 129 125  Radial Waveforms Tri  Tri   Ulnar Waveforms Tri  Tri   Palmar Arch (Allen's Test) Normal with radial compression, obliterates with ulnar compression Normal with ulnar compression, obliterates with radial compression      Findings:  Bilateral palpable pedal pulses.      Farrel Demark RDMS 08/17/2011

## 2011-08-17 NOTE — Progress Notes (Signed)
Dr.Chiu is cardiologist with [facility] and last visit was a week ago and report requested  Echo done a week ago and report requested from [facility]  Stress test done >19yrs ago Heart cath report in epic   Medical MD Dr.Tom Coolidge Breeze

## 2011-08-18 ENCOUNTER — Ambulatory Visit
Admission: RE | Admit: 2011-08-18 | Discharge: 2011-08-18 | Disposition: A | Discharge status: Home / Self Care | Payer: Medicare Other | Source: Ambulatory Visit | Attending: Thoracic Surgery (Cardiothoracic Vascular Surgery) | Admitting: Thoracic Surgery (Cardiothoracic Vascular Surgery)

## 2011-08-18 ENCOUNTER — Telehealth: Payer: Self-pay | Admitting: Thoracic Surgery (Cardiothoracic Vascular Surgery)

## 2011-08-18 DIAGNOSIS — R9389 Abnormal findings on diagnostic imaging of other specified body structures: Secondary | ICD-10-CM

## 2011-08-18 DIAGNOSIS — E041 Nontoxic single thyroid nodule: Secondary | ICD-10-CM

## 2011-08-18 HISTORY — DX: Nontoxic single thyroid nodule: E04.1

## 2011-08-18 MED ORDER — MAGNESIUM SULFATE 50 % IJ SOLN
40.0000 meq | INTRAMUSCULAR | Status: DC
  Filled 2011-08-18: qty 10

## 2011-08-18 MED ORDER — DEXTROSE 5 % IV SOLN
1.5000 g | INTRAVENOUS | Status: AC
  Administered 2011-08-19: 750 g via INTRAVENOUS
  Administered 2011-08-19: 1.5 g via INTRAVENOUS
  Filled 2011-08-18 (×2): qty 1.5

## 2011-08-18 MED ORDER — DEXTROSE 5 % IV SOLN
750.0000 mg | INTRAVENOUS | Status: DC
  Filled 2011-08-18: qty 750

## 2011-08-18 MED ORDER — METOPROLOL TARTRATE 12.5 MG HALF TABLET: 12.5000 mg | ORAL_TABLET | Freq: Once | ORAL | Status: DC

## 2011-08-18 MED ORDER — NITROGLYCERIN IN D5W 200-5 MCG/ML-% IV SOLN
2.0000 ug/min | INTRAVENOUS | Status: AC
  Administered 2011-08-19: 16.67 ug/min via INTRAVENOUS
  Filled 2011-08-18: qty 250

## 2011-08-18 MED ORDER — SODIUM CHLORIDE 0.9 % IV SOLN
INTRAVENOUS | Status: AC
  Administered 2011-08-19: 2.3 [IU]/h via INTRAVENOUS
  Filled 2011-08-18: qty 1

## 2011-08-18 MED ORDER — TRANEXAMIC ACID (OHS) PUMP PRIME SOLUTION
2.0000 mg/kg | INTRAVENOUS | Status: DC
  Filled 2011-08-18: qty 1.45

## 2011-08-18 MED ORDER — VANCOMYCIN HCL 1000 MG IV SOLR
1250.0000 mg | INTRAVENOUS | Status: AC
  Administered 2011-08-19: 1250 mg via INTRAVENOUS
  Filled 2011-08-18: qty 1250

## 2011-08-18 MED ORDER — TRANEXAMIC ACID (OHS) BOLUS VIA INFUSION
15.0000 mg/kg | INTRAVENOUS | Status: AC
Start: 2011-08-19 — End: 2011-08-19
  Administered 2011-08-19: 1089 mg via INTRAVENOUS
  Filled 2011-08-18: qty 1089

## 2011-08-18 MED ORDER — DOPAMINE-DEXTROSE 3.2-5 MG/ML-% IV SOLN
2.0000 ug/kg/min | INTRAVENOUS | Status: AC
  Administered 2011-08-19: 3 ug/kg/min via INTRAVENOUS
  Filled 2011-08-18: qty 250

## 2011-08-18 MED ORDER — PHENYLEPHRINE HCL 10 MG/ML IJ SOLN
30.0000 ug/min | INTRAVENOUS | Status: AC
  Administered 2011-08-19: 10 ug/min via INTRAVENOUS
  Filled 2011-08-18: qty 2

## 2011-08-18 MED ORDER — DEXMEDETOMIDINE HCL IN NACL 400 MCG/100ML IV SOLN
0.1000 ug/kg/h | INTRAVENOUS | Status: AC
  Administered 2011-08-19: .2 ug/kg/h via INTRAVENOUS
  Filled 2011-08-18: qty 100

## 2011-08-18 MED ORDER — TRANEXAMIC ACID 100 MG/ML IV SOLN
1.5000 mg/kg/h | INTRAVENOUS | Status: AC
  Administered 2011-08-19: 1.5 mg/kg/h via INTRAVENOUS
  Filled 2011-08-18: qty 25

## 2011-08-18 MED ORDER — SODIUM BICARBONATE 8.4 % IV SOLN
INTRAVENOUS | Status: AC
  Administered 2011-08-19: 10:00:00
  Filled 2011-08-18 (×2): qty 2.5

## 2011-08-18 MED ORDER — POTASSIUM CHLORIDE 2 MEQ/ML IV SOLN
80.0000 meq | INTRAVENOUS | Status: DC
  Filled 2011-08-18: qty 40

## 2011-08-18 MED ORDER — EPINEPHRINE HCL 1 MG/ML IJ SOLN
0.5000 ug/min | INTRAVENOUS | Status: DC
  Filled 2011-08-18: qty 4

## 2011-08-18 MED ORDER — IOHEXOL 300 MG/ML  SOLN
75.0000 mL | Freq: Once | INTRAMUSCULAR | Status: AC | PRN
  Administered 2011-08-18: 75 mL via INTRAVENOUS

## 2011-08-18 NOTE — Telephone Encounter (Signed)
Telephoned patient to give him results of chest CT scan performed earlier today.  Left message on answering machine

## 2011-08-19 ENCOUNTER — Encounter (HOSPITAL_COMMUNITY): Payer: Self-pay | Admitting: Vascular Surgery

## 2011-08-19 ENCOUNTER — Inpatient Hospital Stay (HOSPITAL_COMMUNITY): Payer: Medicare Other

## 2011-08-19 ENCOUNTER — Inpatient Hospital Stay (HOSPITAL_COMMUNITY)
Admission: RE | Admit: 2011-08-19 | Discharge: 2011-08-30 | DRG: 220 | Disposition: A | Discharge status: Home / Self Care | Payer: Medicare Other | Source: Ambulatory Visit | Attending: Thoracic Surgery (Cardiothoracic Vascular Surgery) | Admitting: Thoracic Surgery (Cardiothoracic Vascular Surgery)

## 2011-08-19 ENCOUNTER — Encounter (HOSPITAL_COMMUNITY): Payer: Self-pay | Admitting: *Deleted

## 2011-08-19 ENCOUNTER — Ambulatory Visit (HOSPITAL_COMMUNITY): Payer: Medicare Other | Admitting: Vascular Surgery

## 2011-08-19 ENCOUNTER — Encounter (HOSPITAL_COMMUNITY): Payer: Self-pay | Admitting: Thoracic Surgery (Cardiothoracic Vascular Surgery)

## 2011-08-19 ENCOUNTER — Encounter (HOSPITAL_COMMUNITY)
Admission: RE | Disposition: A | Discharge status: Home / Self Care | Payer: Self-pay | Source: Ambulatory Visit | Attending: Thoracic Surgery (Cardiothoracic Vascular Surgery)

## 2011-08-19 DIAGNOSIS — Z87891 Personal history of nicotine dependence: Secondary | ICD-10-CM

## 2011-08-19 DIAGNOSIS — E119 Type 2 diabetes mellitus without complications: Secondary | ICD-10-CM | POA: Diagnosis present

## 2011-08-19 DIAGNOSIS — L270 Generalized skin eruption due to drugs and medicaments taken internally: Secondary | ICD-10-CM | POA: Diagnosis not present

## 2011-08-19 DIAGNOSIS — I472 Ventricular tachycardia, unspecified: Secondary | ICD-10-CM | POA: Diagnosis not present

## 2011-08-19 DIAGNOSIS — Z85038 Personal history of other malignant neoplasm of large intestine: Secondary | ICD-10-CM

## 2011-08-19 DIAGNOSIS — I340 Nonrheumatic mitral (valve) insufficiency: Secondary | ICD-10-CM | POA: Diagnosis present

## 2011-08-19 DIAGNOSIS — Z951 Presence of aortocoronary bypass graft: Secondary | ICD-10-CM

## 2011-08-19 DIAGNOSIS — D62 Acute posthemorrhagic anemia: Secondary | ICD-10-CM | POA: Diagnosis not present

## 2011-08-19 DIAGNOSIS — I2510 Atherosclerotic heart disease of native coronary artery without angina pectoris: Secondary | ICD-10-CM | POA: Diagnosis present

## 2011-08-19 DIAGNOSIS — Z7982 Long term (current) use of aspirin: Secondary | ICD-10-CM

## 2011-08-19 DIAGNOSIS — I4891 Unspecified atrial fibrillation: Secondary | ICD-10-CM | POA: Diagnosis not present

## 2011-08-19 DIAGNOSIS — I509 Heart failure, unspecified: Secondary | ICD-10-CM | POA: Diagnosis present

## 2011-08-19 DIAGNOSIS — I059 Rheumatic mitral valve disease, unspecified: Principal | ICD-10-CM | POA: Diagnosis present

## 2011-08-19 DIAGNOSIS — Z9889 Other specified postprocedural states: Secondary | ICD-10-CM

## 2011-08-19 DIAGNOSIS — E785 Hyperlipidemia, unspecified: Secondary | ICD-10-CM | POA: Diagnosis present

## 2011-08-19 DIAGNOSIS — I4729 Other ventricular tachycardia: Secondary | ICD-10-CM | POA: Diagnosis not present

## 2011-08-19 DIAGNOSIS — T466X5A Adverse effect of antihyperlipidemic and antiarteriosclerotic drugs, initial encounter: Secondary | ICD-10-CM | POA: Diagnosis not present

## 2011-08-19 DIAGNOSIS — Z79899 Other long term (current) drug therapy: Secondary | ICD-10-CM

## 2011-08-19 DIAGNOSIS — I519 Heart disease, unspecified: Secondary | ICD-10-CM | POA: Diagnosis not present

## 2011-08-19 DIAGNOSIS — Z7901 Long term (current) use of anticoagulants: Secondary | ICD-10-CM

## 2011-08-19 DIAGNOSIS — Y832 Surgical operation with anastomosis, bypass or graft as the cause of abnormal reaction of the patient, or of later complication, without mention of misadventure at the time of the procedure: Secondary | ICD-10-CM | POA: Diagnosis not present

## 2011-08-19 DIAGNOSIS — Y9210 Unspecified residential institution as the place of occurrence of the external cause: Secondary | ICD-10-CM | POA: Diagnosis not present

## 2011-08-19 DIAGNOSIS — Z8249 Family history of ischemic heart disease and other diseases of the circulatory system: Secondary | ICD-10-CM

## 2011-08-19 DIAGNOSIS — E7800 Pure hypercholesterolemia, unspecified: Secondary | ICD-10-CM | POA: Diagnosis present

## 2011-08-19 HISTORY — DX: Nontoxic single thyroid nodule: E04.1

## 2011-08-19 HISTORY — DX: Other specified postprocedural states: Z98.890

## 2011-08-19 HISTORY — PX: CORONARY ARTERY BYPASS GRAFT: SHX141

## 2011-08-19 HISTORY — DX: Presence of aortocoronary bypass graft: Z95.1

## 2011-08-19 HISTORY — PX: MITRAL VALVE REPAIR: SHX2039

## 2011-08-19 LAB — POCT I-STAT 3, ART BLOOD GAS (G3+)
Acid-base deficit: 1 mmol/L (ref 0.0–2.0)
Patient temperature: 36.7
Patient temperature: 37.1
TCO2: 24 mmol/L (ref 0–100)
pCO2 arterial: 40.1 mmHg (ref 35.0–45.0)
pCO2 arterial: 32.2 mmHg — ABNORMAL LOW (ref 35.0–45.0)
pCO2 arterial: 34.9 mmHg — ABNORMAL LOW (ref 35.0–45.0)
pCO2 arterial: 45.2 mmHg — ABNORMAL HIGH (ref 35.0–45.0)
pH, Arterial: 7.409 (ref 7.350–7.450)
pH, Arterial: 7.369 (ref 7.350–7.450)
pH, Arterial: 7.312 — ABNORMAL LOW (ref 7.350–7.450)
pO2, Arterial: 247 mmHg — ABNORMAL HIGH (ref 80.0–100.0)

## 2011-08-19 LAB — POCT I-STAT 4, (NA,K, GLUC, HGB,HCT)
Glucose, Bld: 135 mg/dL — ABNORMAL HIGH (ref 70–99)
Glucose, Bld: 111 mg/dL — ABNORMAL HIGH (ref 70–99)
Glucose, Bld: 110 mg/dL — ABNORMAL HIGH (ref 70–99)
HCT: 35 % — ABNORMAL LOW (ref 39.0–52.0)
HCT: 25 % — ABNORMAL LOW (ref 39.0–52.0)
HCT: 18 % — ABNORMAL LOW (ref 39.0–52.0)
Hemoglobin: 11.9 g/dL — ABNORMAL LOW (ref 13.0–17.0)
Hemoglobin: 8.5 g/dL — ABNORMAL LOW (ref 13.0–17.0)
Hemoglobin: 6.1 g/dL — CL (ref 13.0–17.0)
Hemoglobin: 8.2 g/dL — ABNORMAL LOW (ref 13.0–17.0)
Potassium: 4.1 mEq/L (ref 3.5–5.1)
Potassium: 5.1 mEq/L (ref 3.5–5.1)
Potassium: 4.2 mEq/L (ref 3.5–5.1)
Sodium: 140 mEq/L (ref 135–145)
Sodium: 139 mEq/L (ref 135–145)
Sodium: 139 mEq/L (ref 135–145)
Sodium: 141 mEq/L (ref 135–145)
Sodium: 142 mEq/L (ref 135–145)

## 2011-08-19 LAB — HEMOGLOBIN AND HEMATOCRIT, BLOOD
HCT: 19 % — ABNORMAL LOW (ref 39.0–52.0)
Hemoglobin: 6.4 g/dL — CL (ref 13.0–17.0)

## 2011-08-19 LAB — CBC
HCT: 31.2 % — ABNORMAL LOW (ref 39.0–52.0)
Hemoglobin: 10.6 g/dL — ABNORMAL LOW (ref 13.0–17.0)
RDW: 14 % (ref 11.5–15.5)
WBC: 25.5 10*3/uL — ABNORMAL HIGH (ref 4.0–10.5)

## 2011-08-19 LAB — GLUCOSE, CAPILLARY
Glucose-Capillary: 114 mg/dL — ABNORMAL HIGH (ref 70–99)
Glucose-Capillary: 113 mg/dL — ABNORMAL HIGH (ref 70–99)
Glucose-Capillary: 95 mg/dL (ref 70–99)

## 2011-08-19 LAB — PROTIME-INR
INR: 1.65 — ABNORMAL HIGH (ref 0.00–1.49)
Prothrombin Time: 19.8 seconds — ABNORMAL HIGH (ref 11.6–15.2)

## 2011-08-19 LAB — APTT: aPTT: 46 seconds — ABNORMAL HIGH (ref 24–37)

## 2011-08-19 LAB — POCT I-STAT GLUCOSE
Glucose, Bld: 146 mg/dL — ABNORMAL HIGH (ref 70–99)
Operator id: 3406

## 2011-08-19 SURGERY — REPAIR, MITRAL VALVE
Anesthesia: General | Site: Chest | Wound class: Clean

## 2011-08-19 MED ORDER — PHENYLEPHRINE HCL 10 MG/ML IJ SOLN
0.0000 ug/min | INTRAVENOUS | Status: DC
  Filled 2011-08-19 (×2): qty 2

## 2011-08-19 MED ORDER — HEMOSTATIC AGENTS (NO CHARGE) OPTIME
TOPICAL | Status: DC | PRN
  Administered 2011-08-19: 3 via TOPICAL

## 2011-08-19 MED ORDER — ALBUMIN HUMAN 5 % IV SOLN
INTRAVENOUS | Status: DC | PRN
  Administered 2011-08-19 (×2): via INTRAVENOUS

## 2011-08-19 MED ORDER — ASPIRIN 81 MG PO CHEW: 324.0000 mg | CHEWABLE_TABLET | Freq: Every day | ORAL | Status: DC

## 2011-08-19 MED ORDER — LACTATED RINGERS IV SOLN
INTRAVENOUS | Status: DC | PRN
  Administered 2011-08-19 (×2): via INTRAVENOUS

## 2011-08-19 MED ORDER — ACETAMINOPHEN 160 MG/5ML PO SOLN: 975.0000 mg | Freq: Four times a day (QID) | ORAL | Status: DC

## 2011-08-19 MED ORDER — SODIUM CHLORIDE 0.45 % IV SOLN
INTRAVENOUS | Status: DC
  Administered 2011-08-19: 20 mL via INTRAVENOUS

## 2011-08-19 MED ORDER — SODIUM CHLORIDE 0.9 % IV SOLN: 250.0000 mL | INTRAVENOUS | Status: DC

## 2011-08-19 MED ORDER — FENTANYL CITRATE 0.05 MG/ML IJ SOLN
INTRAMUSCULAR | Status: DC | PRN
  Administered 2011-08-19: 250 ug via INTRAVENOUS
  Administered 2011-08-19: 150 ug via INTRAVENOUS
  Administered 2011-08-19: 50 ug via INTRAVENOUS
  Administered 2011-08-19 (×2): 250 ug via INTRAVENOUS
  Administered 2011-08-19: 100 ug via INTRAVENOUS
  Administered 2011-08-19: 200 ug via INTRAVENOUS

## 2011-08-19 MED ORDER — HEPARIN SODIUM (PORCINE) 1000 UNIT/ML IJ SOLN
INTRAMUSCULAR | Status: AC
  Filled 2011-08-19: qty 1

## 2011-08-19 MED ORDER — MAGNESIUM SULFATE 40 MG/ML IJ SOLN
4.0000 g | Freq: Once | INTRAMUSCULAR | Status: AC
  Administered 2011-08-19: 4 g via INTRAVENOUS
  Filled 2011-08-19: qty 100

## 2011-08-19 MED ORDER — LACTATED RINGERS IV SOLN: INTRAVENOUS | Status: DC

## 2011-08-19 MED ORDER — SODIUM CHLORIDE 0.9 % IV SOLN: INTRAVENOUS | Status: DC

## 2011-08-19 MED ORDER — PROTAMINE SULFATE 10 MG/ML IV SOLN
INTRAVENOUS | Status: DC | PRN
  Administered 2011-08-19: 50 mg via INTRAVENOUS
  Administered 2011-08-19: 40 mg via INTRAVENOUS
  Administered 2011-08-19: 100 mg via INTRAVENOUS
  Administered 2011-08-19: 50 mg via INTRAVENOUS
  Administered 2011-08-19: 10 mg via INTRAVENOUS

## 2011-08-19 MED ORDER — OXYCODONE HCL 5 MG PO TABS
5.0000 mg | ORAL_TABLET | ORAL | Status: DC | PRN
  Administered 2011-08-20: 5 mg via ORAL
  Administered 2011-08-20: 10 mg via ORAL
  Filled 2011-08-19: qty 2
  Filled 2011-08-19: qty 1

## 2011-08-19 MED ORDER — DEXTROSE 5 % IV SOLN
1.5000 g | Freq: Two times a day (BID) | INTRAVENOUS | Status: DC
  Administered 2011-08-19 – 2011-08-20 (×2): 1.5 g via INTRAVENOUS
  Filled 2011-08-19 (×5): qty 1.5

## 2011-08-19 MED ORDER — DEXMEDETOMIDINE HCL IN NACL 200 MCG/50ML IV SOLN
0.1000 ug/kg/h | INTRAVENOUS | Status: DC
  Filled 2011-08-19 (×2): qty 50

## 2011-08-19 MED ORDER — MORPHINE SULFATE 2 MG/ML IJ SOLN
2.0000 mg | INTRAMUSCULAR | Status: DC | PRN
  Administered 2011-08-20: 2 mg via INTRAVENOUS
  Filled 2011-08-19 (×2): qty 1

## 2011-08-19 MED ORDER — ONDANSETRON HCL 4 MG/2ML IJ SOLN: 4.0000 mg | Freq: Four times a day (QID) | INTRAMUSCULAR | Status: DC | PRN

## 2011-08-19 MED ORDER — ALBUMIN HUMAN 5 % IV SOLN
250.0000 mL | INTRAVENOUS | Status: AC | PRN
Start: 2011-08-19 — End: 2011-08-20
  Administered 2011-08-19 (×3): 250 mL via INTRAVENOUS
  Filled 2011-08-19: qty 250

## 2011-08-19 MED ORDER — ACETAMINOPHEN 160 MG/5ML PO SOLN: 650.0000 mg | ORAL | Status: AC

## 2011-08-19 MED ORDER — ACETAMINOPHEN 500 MG PO TABS
1000.0000 mg | ORAL_TABLET | Freq: Four times a day (QID) | ORAL | Status: AC
  Administered 2011-08-20 – 2011-08-24 (×16): 1000 mg via ORAL
  Filled 2011-08-19 (×19): qty 2

## 2011-08-19 MED ORDER — DOCUSATE SODIUM 100 MG PO CAPS
200.0000 mg | ORAL_CAPSULE | Freq: Every day | ORAL | Status: DC
  Administered 2011-08-20 – 2011-08-29 (×7): 200 mg via ORAL
  Filled 2011-08-19 (×11): qty 2

## 2011-08-19 MED ORDER — SODIUM CHLORIDE 0.9 % IR SOLN
Status: DC | PRN
  Administered 2011-08-19: 6000 mL

## 2011-08-19 MED ORDER — ASPIRIN EC 325 MG PO TBEC
325.0000 mg | DELAYED_RELEASE_TABLET | Freq: Every day | ORAL | Status: DC
  Administered 2011-08-20: 325 mg via ORAL
  Filled 2011-08-19: qty 1

## 2011-08-19 MED ORDER — SODIUM CHLORIDE 0.9 % IJ SOLN: 3.0000 mL | INTRAMUSCULAR | Status: DC | PRN

## 2011-08-19 MED ORDER — ACETAMINOPHEN 650 MG RE SUPP
650.0000 mg | RECTAL | Status: AC
  Administered 2011-08-19: 650 mg via RECTAL

## 2011-08-19 MED ORDER — HEPARIN SODIUM (PORCINE) 1000 UNIT/ML IJ SOLN
INTRAMUSCULAR | Status: AC
Start: 2011-08-19 — End: 2011-08-19
  Filled 2011-08-19: qty 2

## 2011-08-19 MED ORDER — PROPOFOL 10 MG/ML IV EMUL
INTRAVENOUS | Status: DC | PRN
  Administered 2011-08-19: 70 mg via INTRAVENOUS

## 2011-08-19 MED ORDER — SODIUM CHLORIDE 0.9 % IV SOLN
INTRAVENOUS | Status: DC
  Administered 2011-08-19: 2.2 [IU]/h via INTRAVENOUS
  Filled 2011-08-19: qty 1

## 2011-08-19 MED ORDER — SODIUM CHLORIDE 0.9 % IJ SOLN
3.0000 mL | Freq: Two times a day (BID) | INTRAMUSCULAR | Status: DC
  Administered 2011-08-20: 3 mL via INTRAVENOUS

## 2011-08-19 MED ORDER — METOPROLOL TARTRATE 25 MG/10 ML ORAL SUSPENSION
12.5000 mg | Freq: Two times a day (BID) | ORAL | Status: DC
  Filled 2011-08-19 (×3): qty 5

## 2011-08-19 MED ORDER — BISACODYL 5 MG PO TBEC
10.0000 mg | DELAYED_RELEASE_TABLET | Freq: Every day | ORAL | Status: DC
  Administered 2011-08-20 – 2011-08-29 (×6): 10 mg via ORAL
  Filled 2011-08-19 (×7): qty 2

## 2011-08-19 MED ORDER — VANCOMYCIN HCL IN DEXTROSE 1-5 GM/200ML-% IV SOLN
1000.0000 mg | Freq: Once | INTRAVENOUS | Status: AC
  Administered 2011-08-19: 1000 mg via INTRAVENOUS
  Filled 2011-08-19: qty 200

## 2011-08-19 MED ORDER — MIDAZOLAM HCL 5 MG/5ML IJ SOLN
INTRAMUSCULAR | Status: DC | PRN
  Administered 2011-08-19 (×3): 2 mg via INTRAVENOUS
  Administered 2011-08-19: 4 mg via INTRAVENOUS

## 2011-08-19 MED ORDER — MIDAZOLAM HCL 2 MG/2ML IJ SOLN: 2.0000 mg | INTRAMUSCULAR | Status: DC | PRN

## 2011-08-19 MED ORDER — NITROGLYCERIN IN D5W 200-5 MCG/ML-% IV SOLN: 0.0000 ug/min | INTRAVENOUS | Status: DC

## 2011-08-19 MED ORDER — PANTOPRAZOLE SODIUM 40 MG PO TBEC
40.0000 mg | DELAYED_RELEASE_TABLET | Freq: Every day | ORAL | Status: DC
  Administered 2011-08-21 – 2011-08-30 (×10): 40 mg via ORAL
  Filled 2011-08-19 (×9): qty 1

## 2011-08-19 MED ORDER — HEPARIN SODIUM (PORCINE) 1000 UNIT/ML IJ SOLN
INTRAMUSCULAR | Status: DC | PRN
  Administered 2011-08-19: 31000 [IU] via INTRAVENOUS
  Administered 2011-08-19: 2000 [IU] via INTRAVENOUS

## 2011-08-19 MED ORDER — CALCIUM CHLORIDE 10 % IV SOLN
1.0000 g | Freq: Once | INTRAVENOUS | Status: AC | PRN
  Filled 2011-08-19: qty 10

## 2011-08-19 MED ORDER — BISACODYL 10 MG RE SUPP: 10.0000 mg | Freq: Every day | RECTAL | Status: DC

## 2011-08-19 MED ORDER — ROCURONIUM BROMIDE 100 MG/10ML IV SOLN
INTRAVENOUS | Status: DC | PRN
  Administered 2011-08-19: 100 mg via INTRAVENOUS

## 2011-08-19 MED ORDER — METOPROLOL TARTRATE 12.5 MG HALF TABLET
12.5000 mg | ORAL_TABLET | Freq: Two times a day (BID) | ORAL | Status: DC
  Filled 2011-08-19 (×3): qty 1

## 2011-08-19 MED ORDER — INSULIN REGULAR BOLUS VIA INFUSION
0.0000 [IU] | Freq: Three times a day (TID) | INTRAVENOUS | Status: DC
  Filled 2011-08-19: qty 10

## 2011-08-19 MED ORDER — DOPAMINE-DEXTROSE 3.2-5 MG/ML-% IV SOLN
0.0000 ug/kg/min | INTRAVENOUS | Status: DC
  Filled 2011-08-19: qty 250

## 2011-08-19 MED ORDER — METOPROLOL TARTRATE 1 MG/ML IV SOLN
2.5000 mg | INTRAVENOUS | Status: DC | PRN
Start: 2011-08-19 — End: 2011-08-20

## 2011-08-19 MED ORDER — VECURONIUM BROMIDE 10 MG IV SOLR
INTRAVENOUS | Status: DC | PRN
  Administered 2011-08-19 (×3): 5 mg via INTRAVENOUS

## 2011-08-19 MED ORDER — POTASSIUM CHLORIDE 10 MEQ/50ML IV SOLN: 10.0000 meq | INTRAVENOUS | Status: AC

## 2011-08-19 MED ORDER — DEXTROSE 5 % IV SOLN
1.5000 g | Freq: Two times a day (BID) | INTRAVENOUS | Status: DC
  Filled 2011-08-19: qty 1.5

## 2011-08-19 MED ORDER — FAMOTIDINE IN NACL 20-0.9 MG/50ML-% IV SOLN
20.0000 mg | Freq: Two times a day (BID) | INTRAVENOUS | Status: DC
  Administered 2011-08-19: 20 mg via INTRAVENOUS

## 2011-08-19 MED ORDER — MORPHINE SULFATE 2 MG/ML IJ SOLN
1.0000 mg | INTRAMUSCULAR | Status: AC | PRN
  Administered 2011-08-19 (×2): 1 mg via INTRAVENOUS
  Filled 2011-08-19: qty 1

## 2011-08-19 SURGICAL SUPPLY — 152 items
ADAPTER CARDIO PERF ANTE/RETRO (ADAPTER) ×2 IMPLANT
APPLICATOR COTTON TIP 6IN STRL (MISCELLANEOUS) IMPLANT
APPLIER CLIP 9.375 MED OPEN (MISCELLANEOUS)
APPLIER CLIP 9.375 SM OPEN (CLIP) ×2
ATTRACTOMAT 16X20 MAGNETIC DRP (DRAPES) ×2 IMPLANT
BAG DECANTER FOR FLEXI CONT (MISCELLANEOUS) ×2 IMPLANT
BANDAGE ELASTIC 4 VELCRO ST LF (GAUZE/BANDAGES/DRESSINGS) ×2 IMPLANT
BANDAGE ELASTIC 6 VELCRO ST LF (GAUZE/BANDAGES/DRESSINGS) ×2 IMPLANT
BANDAGE GAUZE ELAST BULKY 4 IN (GAUZE/BANDAGES/DRESSINGS) ×2 IMPLANT
BASKET HEART (ORDER IN 25'S) (MISCELLANEOUS) ×1
BASKET HEART (ORDER IN 25S) (MISCELLANEOUS) ×1 IMPLANT
BENZOIN TINCTURE PRP APPL 2/3 (GAUZE/BANDAGES/DRESSINGS) ×2 IMPLANT
BLADE STERNUM SYSTEM 6 (BLADE) ×4 IMPLANT
BLADE SURG 11 STRL SS (BLADE) ×4 IMPLANT
BLADE SURG ROTATE 9660 (MISCELLANEOUS) IMPLANT
CANISTER SUCTION 2500CC (MISCELLANEOUS) ×4 IMPLANT
CANN PRFSN 3/8X14X24FR PCFC (MISCELLANEOUS)
CANN PRFSN 3/8XCNCT ST RT ANG (MISCELLANEOUS)
CANNULA FEM VENOUS REMOTE 22FR (CANNULA) ×4 IMPLANT
CANNULA GUNDRY RCSP 15FR (MISCELLANEOUS) ×4 IMPLANT
CANNULA PRFSN 3/8X14X24FR PCFC (MISCELLANEOUS) IMPLANT
CANNULA PRFSN 3/8XCNCT RT ANG (MISCELLANEOUS) IMPLANT
CANNULA SOFTFLOW AORTIC 7M21FR (CANNULA) ×2 IMPLANT
CANNULA VEN MTL TIP RT (MISCELLANEOUS)
CANNULA VENNOUS METAL TIP 20FR (CANNULA) ×2 IMPLANT
CATH CPB KIT OWEN (MISCELLANEOUS) ×2 IMPLANT
CATH FOLEY 2WAY SLVR  5CC 14FR (CATHETERS)
CATH FOLEY 2WAY SLVR 5CC 14FR (CATHETERS) IMPLANT
CATH THORACIC 28FR (CATHETERS) ×2 IMPLANT
CATH THORACIC 28FR RT ANG (CATHETERS) IMPLANT
CATH THORACIC 36FR (CATHETERS) ×2 IMPLANT
CATH THORACIC 36FR RT ANG (CATHETERS) IMPLANT
CLIP APPLIE 9.375 MED OPEN (MISCELLANEOUS) IMPLANT
CLIP APPLIE 9.375 SM OPEN (CLIP) ×1 IMPLANT
CLIP FOGARTY SPRING 6M (CLIP) IMPLANT
CLIP TI MEDIUM 24 (CLIP) IMPLANT
CLIP TI WIDE RED SMALL 24 (CLIP) IMPLANT
CLOTH BEACON ORANGE TIMEOUT ST (SAFETY) ×2 IMPLANT
CONN 1/2X1/2X1/2  BEN (MISCELLANEOUS) ×1
CONN 1/2X1/2X1/2 BEN (MISCELLANEOUS) ×1 IMPLANT
CONN 3/8X1/2 ST GISH (MISCELLANEOUS) ×4 IMPLANT
CONN Y 3/8X3/8X3/8  BEN (MISCELLANEOUS)
CONN Y 3/8X3/8X3/8 BEN (MISCELLANEOUS) IMPLANT
COVER SURGICAL LIGHT HANDLE (MISCELLANEOUS) ×2 IMPLANT
CRADLE DONUT ADULT HEAD (MISCELLANEOUS) ×2 IMPLANT
DRAIN CHANNEL 32F RND 10.7 FF (WOUND CARE) ×4 IMPLANT
DRAPE CARDIOVASCULAR INCISE (DRAPES) ×1
DRAPE SLUSH/WARMER DISC (DRAPES) ×2 IMPLANT
DRAPE SRG 135X102X78XABS (DRAPES) ×1 IMPLANT
DRSG COVADERM 4X14 (GAUZE/BANDAGES/DRESSINGS) IMPLANT
DRSG MEPILEX BORDER 4X4 (GAUZE/BANDAGES/DRESSINGS) ×2 IMPLANT
ELECT REM PT RETURN 9FT ADLT (ELECTROSURGICAL) ×6
ELECTRODE REM PT RTRN 9FT ADLT (ELECTROSURGICAL) ×3 IMPLANT
FLUID NSS /IRRIG 3000 ML XXX (IV SOLUTION) ×4 IMPLANT
GLOVE BIO SURGEON STRL SZ 6 (GLOVE) ×4 IMPLANT
GLOVE BIO SURGEON STRL SZ 6.5 (GLOVE) ×16 IMPLANT
GLOVE BIO SURGEON STRL SZ7 (GLOVE) IMPLANT
GLOVE BIO SURGEON STRL SZ7.5 (GLOVE) ×8 IMPLANT
GLOVE BIOGEL PI IND STRL 6 (GLOVE) IMPLANT
GLOVE BIOGEL PI IND STRL 6.5 (GLOVE) ×3 IMPLANT
GLOVE BIOGEL PI IND STRL 7.0 (GLOVE) ×6 IMPLANT
GLOVE BIOGEL PI INDICATOR 6 (GLOVE)
GLOVE BIOGEL PI INDICATOR 6.5 (GLOVE) ×3
GLOVE BIOGEL PI INDICATOR 7.0 (GLOVE) ×6
GLOVE EUDERMIC 7 POWDERFREE (GLOVE) IMPLANT
GLOVE ORTHO TXT STRL SZ7.5 (GLOVE) ×6 IMPLANT
GOWN STRL NON-REIN LRG LVL3 (GOWN DISPOSABLE) ×18 IMPLANT
HEMOSTAT POWDER SURGIFOAM 1G (HEMOSTASIS) ×6 IMPLANT
INSERT FOGARTY 61MM (MISCELLANEOUS) IMPLANT
INSERT FOGARTY XLG (MISCELLANEOUS) ×2 IMPLANT
KIT BASIN OR (CUSTOM PROCEDURE TRAY) ×2 IMPLANT
KIT DILATOR VASC 18G NDL (KITS) ×2 IMPLANT
KIT DRAINAGE VACCUM ASSIST (KITS) ×2 IMPLANT
KIT PAIN CUSTOM (MISCELLANEOUS) IMPLANT
KIT ROOM TURNOVER OR (KITS) ×2 IMPLANT
KIT SUCTION CATH 14FR (SUCTIONS) ×12 IMPLANT
KIT VASOVIEW W/TROCAR VH 2000 (KITS) ×2 IMPLANT
LEAD PACING MYOCARDI (MISCELLANEOUS) ×2 IMPLANT
LINE VENT (MISCELLANEOUS) ×2 IMPLANT
MARKER GRAFT CORONARY BYPASS (MISCELLANEOUS) ×6 IMPLANT
NDL SUT 1 .5 CRC FRENCH EYE (NEEDLE) ×1 IMPLANT
NEEDLE FRENCH EYE (NEEDLE) ×1
NS IRRIG 1000ML POUR BTL (IV SOLUTION) ×24 IMPLANT
PACK OPEN HEART (CUSTOM PROCEDURE TRAY) ×2 IMPLANT
PAD ARMBOARD 7.5X6 YLW CONV (MISCELLANEOUS) ×4 IMPLANT
PAD SHARPS MAGNETIC DISPOSAL (MISCELLANEOUS) ×2 IMPLANT
PENCIL BUTTON HOLSTER BLD 10FT (ELECTRODE) ×2 IMPLANT
PUNCH AORTIC ROTATE 4.0MM (MISCELLANEOUS) ×2 IMPLANT
PUNCH AORTIC ROTATE 4.5MM 8IN (MISCELLANEOUS) IMPLANT
PUNCH AORTIC ROTATE 5MM 8IN (MISCELLANEOUS) IMPLANT
RING MITRAL MEMO 3D 38MM SMD38 (Prosthesis & Implant Heart) ×2 IMPLANT
SET CARDIOPLEGIA MPS 5001102 (MISCELLANEOUS) ×2 IMPLANT
SET IRRIG TUBING LAPAROSCOPIC (IRRIGATION / IRRIGATOR) ×2 IMPLANT
SOLUTION ANTI FOG 6CC (MISCELLANEOUS) IMPLANT
SPONGE GAUZE 4X4 12PLY (GAUZE/BANDAGES/DRESSINGS) ×4 IMPLANT
SPONGE LAP 18X18 X RAY DECT (DISPOSABLE) IMPLANT
SPONGE LAP 4X18 X RAY DECT (DISPOSABLE) IMPLANT
SUCKER INTRACARDIAC WEIGHTED (SUCKER) ×2 IMPLANT
SUT BONE WAX W31G (SUTURE) ×2 IMPLANT
SUT ETHIBOND (SUTURE) ×2 IMPLANT
SUT ETHIBOND 2 0 SH (SUTURE) ×4 IMPLANT
SUT ETHIBOND 2 0 SH 36X2 (SUTURE) ×2 IMPLANT
SUT ETHIBOND 2 0 V4 (SUTURE) IMPLANT
SUT ETHIBOND 2 0V4 GREEN (SUTURE) IMPLANT
SUT ETHIBOND 2-0 RB-1 WHT (SUTURE) ×2 IMPLANT
SUT ETHIBOND 4 0 TF (SUTURE) IMPLANT
SUT ETHIBOND 5 0 C 1 30 (SUTURE) ×2 IMPLANT
SUT ETHIBOND NAB MH 2-0 36IN (SUTURE) ×4 IMPLANT
SUT ETHIBOND X763 2 0 SH 1 (SUTURE) ×2 IMPLANT
SUT GORETEX CV-5THC-13 36IN (SUTURE) ×20 IMPLANT
SUT GORETEX CV4 TH-18 (SUTURE) ×6 IMPLANT
SUT MNCRL AB 3-0 PS2 18 (SUTURE) ×4 IMPLANT
SUT MNCRL AB 4-0 PS2 18 (SUTURE) ×6 IMPLANT
SUT PDS AB 1 CTX 36 (SUTURE) ×4 IMPLANT
SUT PROLENE 2 0 SH DA (SUTURE) IMPLANT
SUT PROLENE 3 0 SH 1 (SUTURE) ×2 IMPLANT
SUT PROLENE 3 0 SH DA (SUTURE) ×6 IMPLANT
SUT PROLENE 3 0 SH1 36 (SUTURE) IMPLANT
SUT PROLENE 4 0 RB 1 (SUTURE) ×6
SUT PROLENE 4 0 SH DA (SUTURE) ×4 IMPLANT
SUT PROLENE 4-0 RB1 .5 CRCL 36 (SUTURE) ×6 IMPLANT
SUT PROLENE 5 0 C 1 36 (SUTURE) IMPLANT
SUT PROLENE 6 0 C 1 30 (SUTURE) ×12 IMPLANT
SUT PROLENE 7.0 RB 3 (SUTURE) ×6 IMPLANT
SUT PROLENE 8 0 BV175 6 (SUTURE) IMPLANT
SUT PROLENE BLUE 7 0 (SUTURE) ×2 IMPLANT
SUT PROLENE POLY MONO (SUTURE) IMPLANT
SUT SILK  1 MH (SUTURE) ×6
SUT SILK 1 MH (SUTURE) ×6 IMPLANT
SUT STEEL 6MS V (SUTURE) IMPLANT
SUT STEEL STERNAL CCS#1 18IN (SUTURE) IMPLANT
SUT STEEL SZ 6 DBL 3X14 BALL (SUTURE) ×6 IMPLANT
SUT VIC AB 1 CTX 36 (SUTURE)
SUT VIC AB 1 CTX36XBRD ANBCTR (SUTURE) IMPLANT
SUT VIC AB 2-0 CT1 27 (SUTURE) ×1
SUT VIC AB 2-0 CT1 TAPERPNT 27 (SUTURE) ×1 IMPLANT
SUT VIC AB 2-0 CTX 27 (SUTURE) IMPLANT
SUT VIC AB 2-0 UR6 27 (SUTURE) ×2 IMPLANT
SUT VIC AB 3-0 SH 27 (SUTURE)
SUT VIC AB 3-0 SH 27X BRD (SUTURE) IMPLANT
SUT VIC AB 3-0 X1 27 (SUTURE) IMPLANT
SUT VICRYL 4-0 PS2 18IN ABS (SUTURE) IMPLANT
SUTURE E-PAK OPEN HEART (SUTURE) ×2 IMPLANT
SYSTEM SAHARA CHEST DRAIN ATS (WOUND CARE) ×6 IMPLANT
TAPE CLOTH SURG 4X10 WHT LF (GAUZE/BANDAGES/DRESSINGS) ×2 IMPLANT
TOWEL OR 17X24 6PK STRL BLUE (TOWEL DISPOSABLE) ×4 IMPLANT
TOWEL OR 17X26 10 PK STRL BLUE (TOWEL DISPOSABLE) ×6 IMPLANT
TRAY FOLEY IC TEMP SENS 14FR (CATHETERS) ×2 IMPLANT
TUBE SUCT INTRACARD DLP 20F (MISCELLANEOUS) ×2 IMPLANT
TUBING INSUFFLATION 10FT LAP (TUBING) ×4 IMPLANT
UNDERPAD 30X30 INCONTINENT (UNDERPADS AND DIAPERS) ×2 IMPLANT
WATER STERILE IRR 1000ML POUR (IV SOLUTION) ×4 IMPLANT

## 2011-08-19 NOTE — Addendum Note (Signed)
Addendum  created 08/19/11 1923 by Kipp Brood, MD   Modules edited:Notes Section

## 2011-08-19 NOTE — Anesthesia Preprocedure Evaluation (Addendum)
Anesthesia Evaluation  Patient identified by MRN, date of birth, ID band Patient awake    Reviewed: Allergy & Precautions, H&P , NPO status , Patient's Chart, lab work & pertinent test results, reviewed documented beta blocker date and time   Airway Mallampati: II      Dental  (+) Teeth Intact   Pulmonary shortness of breath,  breath sounds clear to auscultation        Cardiovascular + CAD and +CHF + Valvular Problems/Murmurs MR and MVP Rhythm:Regular Rate:Normal + Systolic murmurs    Neuro/Psych    GI/Hepatic hiatal hernia,   Endo/Other    Renal/GU      Musculoskeletal   Abdominal   Peds  Hematology   Anesthesia Other Findings   Reproductive/Obstetrics                         Anesthesia Physical Anesthesia Plan  ASA: III  Anesthesia Plan: General   Post-op Pain Management:    Induction: Intravenous  Airway Management Planned: Oral ETT  Additional Equipment: Arterial line, CVP, PA Cath, Ultrasound Guidance Line Placement, 3D TEE and TEE  Intra-op Plan:   Post-operative Plan: Post-operative intubation/ventilation  Informed Consent: I have reviewed the patients History and Physical, chart, labs and discussed the procedure including the risks, benefits and alternatives for the proposed anesthesia with the patient or authorized representative who has indicated his/her understanding and acceptance.   Dental advisory given  Plan Discussed with: Anesthesiologist and Surgeon  Anesthesia Plan Comments:        Anesthesia Quick Evaluation

## 2011-08-19 NOTE — Op Note (Addendum)
CARDIOTHORACIC SURGERY OPERATIVE NOTE  Date of Procedure:  08/19/2011  Preoperative Diagnosis:   Severe Mitral Regurgitation  2-vessel Coronary Artery Disease  Postoperative Diagnosis: Same  Procedure:    Mitral Valve Repair  Complex valvuloplasty including triangular resection of flail posterior leaflet  Gore-tex neocord placement x6  Sorin Memo 3D ring annuloplasty (size 38mm, catalog #[id number], serial [id number])   Coronary Artery Bypass Grafting x 2   Left Internal Mammary Artery to Distal Left Anterior Descending Coronary Artery  Saphenous Vein Graft to Distal Right Coronary Artery  Endoscopic Vein Harvest from Left Thigh    Surgeon: Salvatore Decent. 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 The patient has been relatively physically active until recently.  Several weeks ago the patient developed relatively sudden onset of exertional shortness of breath and orthopnea associated with dry nonproductive cough. These symptoms became quite severe, prompting him to go for evaluation at [facility]. Chest x-ray was performed demonstrating bilateral pleural effusions and opacity consistent with congestive heart failure versus pneumonia. The patient was treated for presumed pneumonia but seen in followup by his  primary care physician where he was noted to have a loud murmur on physical exam. He was referred to Dr. Rhona Leavens who performed transthoracic echocardiogram on 08/05/2011. By report this demonstrated severe prolapse of the posterior leaflet of the mitral valve with severe mitral regurgitation and severe left atrial enlargement. There is normal left ventricular systolic function. The patient was started on medical therapy for congestive heart failure, and his symptoms have improved somewhat. However, the patient  Continues to experience exertional shortness of breath and orthopnea.  The patient subsequently underwent both transesophageal echocardiogram and left and right heart catheterization on 08/10/2011. These confirmed the presence of mitral valve prolapse with flail segment of the posterior leaflet of the mitral valve and severe mitral regurgitation. The patient was found to have moderate two-vessel coronary artery disease. The patient was referred for possible surgical intervention.  The patient has been seen in consultation and counseled at length regarding the indications, risks and potential benefits of surgery.  All questions have been answered, and the patient provides full informed consent for the operation as described.     DETAILS OF THE OPERATIVE PROCEDURE  The patient is brought to the operating room on the above mentioned date and central monitoring was established by the anesthesia team including placement of Swan-Ganz catheter and radial arterial line. The patient is placed in the supine position on the operating table.  Intravenous antibiotics are administered. General endotracheal anesthesia is induced uneventfully. A Foley catheter is placed.  Baseline transesophageal echocardiogram was performed.   There was severe (4+) mitral regurgitation with obvious flail segment of the middle scallop of the posterior leaflet of the mitral valve. There was flow reversal in the pulmonary veins. The  jet  of regurgitation courses anteriorly around the left atrium. There was mild left ventricular chamber enlargement with normal left ventricular systolic function. There was mild aortic insufficiency. There was trace tricuspid regurgitation. No other abnormalities were noted.  The patient's chest, abdomen, both groins, and both lower extremities are prepared and draped in a sterile manner. A time out procedure is performed.  A median sternotomy incision was performed and the left internal mammary artery is dissected from the chest wall and prepared for bypass grafting. The left internal mammary artery is notably good quality conduit. Simultaneously, saphenous vein is obtained from the patient's left thigh using endoscopic vein harvest technique. Initially an incision was made in the patient's right thigh but the saphenous vein on the right side was felt to be too small and was not removed.The saphenous vein is notably fair quality conduit. After removal of the saphenous vein, the small surgical incisions in the lower extremity are closed with absorbable suture. Following systemic heparinization, the left internal mammary artery was transected distally noted to have excellent flow.  The pericardium is opened. The ascending aorta is normal in appearance. The right common femoral vein is cannulated using the Seldinger technique and a guidewire advanced into the right atrium using TEE guidance.  The patient is heparinized systemically and the right common femoral vein cannulated using a 22 Fr long femoral venous cannula.  The ascending aorta is cannulated for cardiopulmonary bypass.  Adequate heparinization is verified.   Attempts at placing a retrograde cardioplegia cannula through the right atrium into the coronary sinus were unsuccessful.   The entire pre-bypass portion of the operation was notable for stable hemodynamics.  Cardiopulmonary bypass was begun and the surface of the heart is inspected.  A second  venous cannula is placed directly into the superior vena cava.  Distal target vessels are selected for coronary artery bypass grafting.  A cardioplegia cannula is placed in the ascending aorta.  A temperature probe was placed in the interventricular septum.  The patient is cooled to 28C systemic temperature.  The aortic cross clamp is applied and cold blood cardioplegia is delivered initially in an antegrade fashion through the aortic root.   Iced saline slush is applied for topical hypothermia.  The initial cardioplegic arrest is rapid with early diastolic arrest.  Repeat doses of cardioplegia are administered intermittently throughout the entire cross clamp portion of the operation through the aortic root and through subsequently placed vein graft in order to maintain completely flat electrocardiogram and septal myocardial temperature below 15C.  Myocardial protection was felt to be excellent.  The following distal coronary artery bypass grafts were performed:   The distal right coronary artery was grafted using a reversed saphenous vein graft in an end-to-side fashion.  At the site of distal anastomosis the target vessel was good quality and measured approximately 2.0 mm in diameter.  The distal left anterior coronary artery was grafted with the left internal mammary artery in an end-to-side fashion.  At the site of distal anastomosis the target vessel was good quality and measured approximately 2.0 mm in diameter.  A left atriotomy incision was performed through the interatrial groove and extended partially across the back wall of the left atrium after opening the oblique sinus inferiorly.  The mitral valve is exposed using a self-retaining retractor.  The mitral valve was inspected and notable for notable for multisegment prolapse of the posterior leaflet of the mitral valve with fibroelastic deficiency and forme fruste variant of Barlow's disease.  There was a flail segment of the middle scallop  (P2) of the posterior leaflet. There was severe prolapse without rupture or flail it P1. There is modest prolapse at P3.  Interrupted 2-0 Ethibond horizontal mattress sutures were placed circumferentially around the entire mitral annulus.  These sutures will ultimately be utilized for ring annuloplasty, and at this juncture they are utilized to suspend the valve symmetrically.    The flail segment of the posterior leaflet was repaired using a triangular resection. The total surface area of the resected portion consisted of less than 25% of the surface area of P2. The intervening vertical defect in the posterior leaflet was closed using everting simple CV 5 Gore-Tex sutures.  A total of 3 pairs of artificial Gore-Tex neocords were placed including one pair through the head of the anterior papillary muscle and 2 pairs through the head of the posterior papillary muscle. Pledgeted CV 4 Gore-Tex suture was placed in horizontal mattress fashion.  These cords were all gathered temp early and not placed through the posterior leaflet until after ring annuloplasty was completed.  The valve was sized to accept a 38 mm annuloplasty ring based upon the overall surface area of the anterior leaflet as well as the distance between the anterior and posterior commissures and the vertical height of the anterior leaflet.    A Sorin Memo 3D annuloplasty ring (size 38mm, Catalog [id number], serial [id number]) was implanted uneventfully.    After ring annuloplasty was completed the ventricle was filled with saline and inspected carefully. Each of the pairs of Gore-Tex cords were then woven into the posterior leaflet beginning at the free margin where each pair was placed from the ventricular to the atrial surface and then woven in a diamond-shaped fashion towards the posterior annulus. The pair of neocords from the anterior papillary muscle were placed in the P1 portion of the posterior leaflet.  One of the pairs from the posterior  papillary muscle were placed through P2 just to the right of midline towards the posterior commissure, and the third pair were placed into P3.  The ventricle was filled with saline while each pair of neoords was tied to adjust them to appropriate length.  A commissuroplasty was performed at the anterior commissure using 2 everting 4-0 Prolene sutures. Similarly, the cleft between P2 and P3 was closed using a pair of everting 4-0 Prolene sutures.  After completion of the valve repair the valve was tested with saline and appeared to be perfectly competant.  There was a broad, symmetrical line of coaptation between the anterior and posterior leaflets and no residual mitral regurgitation.  Rewarming was begun.  The atriotomy was closed using a 2-layer closure of running 3-0 Prolene suture after placing a sump drain across the mitral valve to serve as a left ventricular vent.  The proximal vein graft anastomosis was placed directly to the ascending aorta prior to removal of the aortic cross clamp.  The septal myocardial temperature rose rapidly after reperfusion of the left internal mammary artery graft.  The aortic cross clamp was removed after a total cross clamp time of 173 minutes.  All proximal and distal coronary anastomoses were inspected for hemostasis and appropriate graft orientation.  Epicardial pacing wires are fixed to the right ventricular outflow tract and to the right atrial appendage. The patient is rewarmed to 37C temperature. The aortic and left ventricular vents are removed.  The patient is weaned and disconnected from cardiopulmonary bypass.  The patient's rhythm at separation from bypass  was AV paced.  The patient was weaned from cardioplegic bypass without any inotropic support. Total cardiopulmonary bypass time for the operation was 202 minutes.  Followup transesophageal echocardiogram performed after separation from bypass revealed a well-seated mitral annuloplasty ring and a mitral  valve that was functioning normally and without any residual mitral regurgitation.  Left ventricular function was unchanged from preoperatively.  The aortic and superior vena cava cannula were removed uneventfully. Protamine was administered to reverse the anticoagulation. The femoral venous cannula was removed and manual pressure held on the groin for 30 minutes.  The mediastinum and pleural space were inspected for hemostasis and irrigated with saline solution. The mediastinum and both pleural spaces were drained using 4 chest tubes placed through separate stab incisions inferiorly.  The soft tissues anterior to the aorta were reapproximated loosely. The sternum is closed with double strength sternal wire. The soft tissues anterior to the sternum were closed in multiple layers and the skin is closed with a running subcuticular skin closure.  The patient tolerated the procedure well and is transported to the surgical intensive care in stable condition. There are no intraoperative complications. All sponge instrument and needle counts are verified correct at completion of the operation.   The post-bypass portion of the operation was notable for stable rhythm and hemodynamics.   No blood products were administered during the operation.    Salvatore Decent. Cornelius Moras MD 08/19/2011 3:15 PM

## 2011-08-19 NOTE — Transfer of Care (Signed)
Immediate Anesthesia Transfer of Care Note  Patient: [name] [name]  Procedure(s) Performed: Procedure(s) (LRB): MITRAL VALVE REPAIR (MVR) (N/A) CORONARY ARTERY BYPASS GRAFTING (CABG) (N/A)  Patient Location: SICU  Anesthesia Type: General  Level of Consciousness: Patient remains intubated per anesthesia plan  Airway & Oxygen Therapy: Patient remains intubated per anesthesia plan and Patient placed on Ventilator (see vital sign flow sheet for setting)  Post-op Assessment: Report given to PACU RN  Post vital signs: Reviewed and stable  Complications: No apparent anesthesia complications

## 2011-08-19 NOTE — Brief Op Note (Signed)
08/19/2011  12:51 PM  PATIENT:  Chauncey Cruel  78 y.o. 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

## 2011-08-19 NOTE — Progress Notes (Signed)
  Echocardiogram Echocardiogram Transesophageal (3D OR) has been performed.  Jorje Guild 08/19/2011, 9:13 AM

## 2011-08-19 NOTE — Procedures (Signed)
Extubation Procedure Note  Patient Details:   Name: [name] [name] DOB: 1929/09/05 MRN: [id number]   Airway Documentation:  Airway 8 mm (Active)  Secured at (cm) 22 cm 08/19/2011  6:20 PM  Measured From Lips 08/19/2011  6:20 PM  Secured By Pink Tape 08/19/2011  6:20 PM  Site Condition Dry 08/19/2011  2:58 PM    Evaluation  O2 sats: 96% on 4 lpm nasal cannula Complications: none Patient did tolerate procedure well. Bilateral Breath Sounds: Clear;Diminished   Yes PT can speak. Pt was extubated to 4 lpm nasal cannula with no apparent complications. Pt has o2 sats 96%, no stridor noted, and in no respiratory distress. Pt nif -23 and VC 700.  ABG    Component Value Date/Time   PHART 7.312* 08/19/2011 1923   PCO2ART 45.2* 08/19/2011 1923   PO2ART 124.0* 08/19/2011 1923   HCO3 22.8 08/19/2011 1923   TCO2 24 08/19/2011 1923   ACIDBASEDEF 3.0* 08/19/2011 1923   O2SAT 98.0 08/19/2011 1923    HUDSON, Cassadee Vanzandt N 08/19/2011, 7:50 PM

## 2011-08-19 NOTE — Preoperative (Signed)
Beta Blockers   Reason not to administer Beta Blockers:Not Applicable 

## 2011-08-19 NOTE — Interval H&P Note (Signed)
History and Physical Interval Note:  08/19/2011 7:37 AM  [name] [name]  has presented today for surgery, with the diagnosis of mitral regurg.  The various methods of treatment have been discussed with the patient and family. After consideration of risks, benefits and other options for treatment, the patient has consented to  Procedure(s) (LRB): MITRAL VALVE REPAIR (MVR) (N/A) CORONARY ARTERY BYPASS GRAFTING (CABG) (N/A) as a surgical intervention .  The patient's history has been reviewed, patient examined, no change in status, stable for surgery.  I have reviewed the patient's chart and labs.  Questions were answered to the patient's satisfaction.     Filomeno Cromley H

## 2011-08-19 NOTE — CV Procedure (Signed)
Intra-operative Transesophageal Echo Report:  [name] is a 78 year old male with recently diagnosed severe mitral regurgitation and moderate two-vessel coronary artery disease who is scheduled to undergo mitral valve repair and coronary artery bypass grafting by Dr. Cornelius Moras. Operative transesophageal echocardiography was requested to evaluate the mitral valve and assist with the repair of the valve and to serve as a monitor for intraoperative volume status.  The patient was brought to the operating room at [facility] and general anesthesia was induced without difficulty. Following endotracheal intubation and gastric suctioning, the transesophageal echocardiography probe was inserted into the esophagus without difficulty.  Impression: Pre-bypass findings:  1. Mitral valve: There was severe mitral regurgitation. This was due to a flail segment and ruptured chordae involving the P2 and P1 segments of the posterior leaflet of the  mitral valve. This resulted in a  jet of mitral regurgitation which was anteriorly directed along the superior surface of the anterior leaflet. The vena contracta measured 0.73 centimeters and there was systolic flow reversal in the left upper pulmonary vein. The location of origin of mitral regurgitation appeared confined to the P1 and P2 are area.  2. Aortic valve: Aortic valve was trileaflet and the leaflets were not thickened or calcified. The leaflets opened normally and there was 1+ aortic insufficiency noted.  3. Left Ventricle: The left ventricle was markedly dilated and there was vigorous contractility in all segments interrogated. Left ventricular end-diastolic diameter was 6.78 cm and left ventricular end-systolic diameter was 4.12 cm in the mid papillary level in the transgastric short axis view. The ventricular wall thickness measured 1.05-1.08 cm of the anterior and posterior walls. The left ventricular ejection fraction was estimated at 65%. There  were no regional wall motion abnormalities noted.  4. Right ventricle: The right ventricle was of normal size and there appeared to be normal contractility of the right ventricular free wall.   5. Tricuspid valve: The tricuspid valve appeared structurally normal and there was trace tricuspid insufficiency.  6. Inter-atrial septum: The inter-atrial septum was intact without evidence of patent foramen ovale or atrial septal defect by color Doppler or bubble study.  7. Left atrium: The left atrial cavity was enlarged and there was no thrombus noted in the left atrium or left atrial appendage.  8. Descending aorta: The ascending aorta showed some mild thickening of the walls of the aorta but no obvious atheromatous plaque noted. There was a well-defined aortic root and sinotubular ridge with no aneurysmal dilatation or effacement of the sinuses of Valsalva.  9. Descending aorta: There was 1-2+ atheromatous disease in the descending aorta and the descending aorta measured 2.78 cm in diameter.  Post-bypass findings:  1. Mitral valve:  There was an annuloplasty ring in the mitral position. The posterior leaflet was fixed and the anterior leaflet opened freely. There was no residual mitral insufficiency noted. Continuous-wave Doppler interrogation of the mitral inflow revealed a mean transmitral gradient of 2 mm of mercury. Mitral valve area by the pressure half-time method was 2.78 cm.  2. Aortic valve: The aortic valve open normally and there was 1+ aortic insufficiency which appeared unchanged from the pre-bypass study  3 Left ventricle: The left ventricular cavity appeared enlarged there was some dyssynchrony of left ventricular contraction due to ventricular pacing. Ejection fraction was estimated at 50-55%.  4. Right ventricle: The right ventricular size with was normal and there was normal contractility of the right ventricular free wall.  5. Tricuspid valve: The tricuspid apparatus was  intact and there was trace tricuspid insufficiency.  Kipp Brood, M.D.

## 2011-08-19 NOTE — Progress Notes (Signed)
[facility] BRIEF SICU PROGRESS NOTE  Day of Surgery  S/P Procedure(s) (LRB): MITRAL VALVE REPAIR (MVR) (N/A) CORONARY ARTERY BYPASS GRAFTING (CABG) (N/A)   Extubated uneventfully AV paced BP stable CI > 2 PA 43/20 O2 sats 94-96% Chest tube output low UOP > 150 mL/hr Labs okay  Plan: Continue routine early postop  OWEN,CLARENCE H 08/19/2011 8:40 PM

## 2011-08-19 NOTE — Anesthesia Postprocedure Evaluation (Signed)
  Anesthesia Post-op Note  Patient: [name] [name]  Procedure(s) Performed: Procedure(s) (LRB): MITRAL VALVE REPAIR (MVR) (N/A) CORONARY ARTERY BYPASS GRAFTING (CABG) (N/A)  Patient Location: SICU  Anesthesia Type: General  Level of Consciousness: sedated, unresponsive and Patient remains intubated per anesthesia plan  Airway and Oxygen Therapy: Patient remains intubated per anesthesia plan and Patient placed on Ventilator (see vital sign flow sheet for setting)  Post-op Pain: none  Post-op Assessment: Post-op Vital signs reviewed and Patient's Cardiovascular Status Stable  Post-op Vital Signs: stable  Complications: No apparent anesthesia complications

## 2011-08-20 ENCOUNTER — Encounter (HOSPITAL_COMMUNITY): Payer: Self-pay | Admitting: Thoracic Surgery (Cardiothoracic Vascular Surgery)

## 2011-08-20 ENCOUNTER — Inpatient Hospital Stay (HOSPITAL_COMMUNITY): Payer: Medicare Other

## 2011-08-20 LAB — CBC
HCT: 29 % — ABNORMAL LOW (ref 39.0–52.0)
Hemoglobin: 10 g/dL — ABNORMAL LOW (ref 13.0–17.0)
Hemoglobin: 9.4 g/dL — ABNORMAL LOW (ref 13.0–17.0)
MCH: 29.7 pg (ref 26.0–34.0)
MCH: 29.8 pg (ref 26.0–34.0)
MCHC: 32.7 g/dL (ref 30.0–36.0)
MCV: 89.8 fL (ref 78.0–100.0)
Platelets: 139 10*3/uL — ABNORMAL LOW (ref 150–400)
Platelets: 134 10*3/uL — ABNORMAL LOW (ref 150–400)
RBC: 3.23 MIL/uL — ABNORMAL LOW (ref 4.22–5.81)
RBC: 3.39 MIL/uL — ABNORMAL LOW (ref 4.22–5.81)
RBC: 3.15 MIL/uL — ABNORMAL LOW (ref 4.22–5.81)
WBC: 14.6 10*3/uL — ABNORMAL HIGH (ref 4.0–10.5)

## 2011-08-20 LAB — GLUCOSE, CAPILLARY
Glucose-Capillary: 102 mg/dL — ABNORMAL HIGH (ref 70–99)
Glucose-Capillary: 113 mg/dL — ABNORMAL HIGH (ref 70–99)
Glucose-Capillary: 120 mg/dL — ABNORMAL HIGH (ref 70–99)
Glucose-Capillary: 151 mg/dL — ABNORMAL HIGH (ref 70–99)
Glucose-Capillary: 155 mg/dL — ABNORMAL HIGH (ref 70–99)

## 2011-08-20 LAB — POCT I-STAT, CHEM 8
BUN: 20 mg/dL (ref 6–23)
Calcium, Ion: 1.16 mmol/L (ref 1.13–1.30)
HCT: 28 % — ABNORMAL LOW (ref 39.0–52.0)
Hemoglobin: 9.5 g/dL — ABNORMAL LOW (ref 13.0–17.0)
Hemoglobin: 9.9 g/dL — ABNORMAL LOW (ref 13.0–17.0)
Potassium: 4.6 mEq/L (ref 3.5–5.1)
Sodium: 143 mEq/L (ref 135–145)
TCO2: 21 mmol/L (ref 0–100)
TCO2: 24 mmol/L (ref 0–100)

## 2011-08-20 LAB — BASIC METABOLIC PANEL
GFR calc non Af Amer: 77 mL/min — ABNORMAL LOW (ref >90)
Glucose, Bld: 134 mg/dL — ABNORMAL HIGH (ref 70–99)
Potassium: 4.7 mEq/L (ref 3.5–5.1)
Sodium: 141 mEq/L (ref 135–145)

## 2011-08-20 LAB — MAGNESIUM
Magnesium: 3.4 mg/dL — ABNORMAL HIGH (ref 1.5–2.5)
Magnesium: 3 mg/dL — ABNORMAL HIGH (ref 1.5–2.5)

## 2011-08-20 LAB — CREATININE, SERUM
Creatinine, Ser: 1.2 mg/dL (ref 0.50–1.35)
GFR calc Af Amer: 65 mL/min — ABNORMAL LOW (ref >90)
GFR calc non Af Amer: 56 mL/min — ABNORMAL LOW (ref >90)

## 2011-08-20 MED ORDER — NIACIN 500 MG PO TABS
1000.0000 mg | ORAL_TABLET | Freq: Every day | ORAL | Status: DC
  Administered 2011-08-20 – 2011-08-27 (×8): 1000 mg via ORAL
  Filled 2011-08-20 (×9): qty 2

## 2011-08-20 MED ORDER — MORPHINE SULFATE 2 MG/ML IJ SOLN: 2.0000 mg | INTRAMUSCULAR | Status: DC | PRN

## 2011-08-20 MED ORDER — FUROSEMIDE 40 MG PO TABS
40.0000 mg | ORAL_TABLET | Freq: Every day | ORAL | Status: DC
  Administered 2011-08-21 – 2011-08-30 (×10): 40 mg via ORAL
  Filled 2011-08-20 (×10): qty 1

## 2011-08-20 MED ORDER — INSULIN ASPART 100 UNIT/ML ~~LOC~~ SOLN: 0.0000 [IU] | SUBCUTANEOUS | Status: DC

## 2011-08-20 MED ORDER — CARVEDILOL 3.125 MG PO TABS
3.1250 mg | ORAL_TABLET | Freq: Two times a day (BID) | ORAL | Status: DC
  Administered 2011-08-20 – 2011-08-30 (×20): 3.125 mg via ORAL
  Filled 2011-08-20 (×23): qty 1

## 2011-08-20 MED ORDER — AMIODARONE HCL 200 MG PO TABS
200.0000 mg | ORAL_TABLET | Freq: Two times a day (BID) | ORAL | Status: DC
  Administered 2011-08-20 – 2011-08-30 (×21): 200 mg via ORAL
  Filled 2011-08-20 (×24): qty 1

## 2011-08-20 MED ORDER — MIDAZOLAM HCL 2 MG/2ML IJ SOLN
2.0000 mg | Freq: Once | INTRAMUSCULAR | Status: AC
  Administered 2011-08-20: 2 mg via INTRAVENOUS
  Filled 2011-08-20: qty 2

## 2011-08-20 MED ORDER — WARFARIN SODIUM 2.5 MG PO TABS
2.5000 mg | ORAL_TABLET | Freq: Every day | ORAL | Status: DC
  Administered 2011-08-20 – 2011-08-22 (×3): 2.5 mg via ORAL
  Filled 2011-08-20 (×4): qty 1

## 2011-08-20 MED ORDER — ALBUMIN HUMAN 5 % IV SOLN
12.5000 g | Freq: Once | INTRAVENOUS | Status: AC
  Administered 2011-08-20: 12.5 g via INTRAVENOUS
  Filled 2011-08-20: qty 250

## 2011-08-20 MED ORDER — ASPIRIN EC 81 MG PO TBEC
81.0000 mg | DELAYED_RELEASE_TABLET | Freq: Every day | ORAL | Status: DC
  Administered 2011-08-21 – 2011-08-30 (×10): 81 mg via ORAL
  Filled 2011-08-20 (×11): qty 1

## 2011-08-20 MED ORDER — TRAMADOL HCL 50 MG PO TABS: 50.0000 mg | ORAL_TABLET | ORAL | Status: DC | PRN

## 2011-08-20 MED ORDER — INSULIN ASPART 100 UNIT/ML ~~LOC~~ SOLN
0.0000 [IU] | SUBCUTANEOUS | Status: AC
  Administered 2011-08-20: 2 [IU] via SUBCUTANEOUS

## 2011-08-20 MED ORDER — SODIUM CHLORIDE 0.9 % IJ SOLN
3.0000 mL | Freq: Two times a day (BID) | INTRAMUSCULAR | Status: DC
  Administered 2011-08-20 – 2011-08-21 (×2): 3 mL via INTRAVENOUS

## 2011-08-20 MED ORDER — INSULIN ASPART 100 UNIT/ML ~~LOC~~ SOLN
0.0000 [IU] | SUBCUTANEOUS | Status: DC
  Administered 2011-08-20: 2 [IU] via SUBCUTANEOUS
  Administered 2011-08-20: 4 [IU] via SUBCUTANEOUS

## 2011-08-20 MED ORDER — INSULIN ASPART 100 UNIT/ML ~~LOC~~ SOLN
0.0000 [IU] | Freq: Three times a day (TID) | SUBCUTANEOUS | Status: DC
  Administered 2011-08-20 – 2011-08-21 (×3): 2 [IU] via SUBCUTANEOUS

## 2011-08-20 MED ORDER — WARFARIN - PHYSICIAN DOSING INPATIENT
Freq: Every day | Status: DC
  Administered 2011-08-21 – 2011-08-23 (×2)

## 2011-08-20 MED ORDER — POTASSIUM CHLORIDE CRYS ER 20 MEQ PO TBCR
20.0000 meq | EXTENDED_RELEASE_TABLET | Freq: Every day | ORAL | Status: DC
  Administered 2011-08-21 – 2011-08-30 (×10): 20 meq via ORAL
  Filled 2011-08-20 (×10): qty 1

## 2011-08-20 MED ORDER — FUROSEMIDE 10 MG/ML IJ SOLN
20.0000 mg | Freq: Four times a day (QID) | INTRAMUSCULAR | Status: DC
  Administered 2011-08-20 (×2): 20 mg via INTRAVENOUS
  Filled 2011-08-20 (×3): qty 2

## 2011-08-20 MED ORDER — MOVING RIGHT ALONG BOOK
Freq: Once | Status: AC
  Administered 2011-08-20: 20:00:00
  Filled 2011-08-20 (×2): qty 1

## 2011-08-20 MED ORDER — SODIUM CHLORIDE 0.9 % IJ SOLN: 3.0000 mL | INTRAMUSCULAR | Status: DC | PRN

## 2011-08-20 MED ORDER — SODIUM CHLORIDE 0.9 % IV SOLN: 250.0000 mL | INTRAVENOUS | Status: DC | PRN

## 2011-08-20 MED FILL — Sodium Bicarbonate IV Soln 8.4%: INTRAVENOUS | Qty: 50 | Status: AC

## 2011-08-20 MED FILL — Magnesium Sulfate Inj 50%: INTRAMUSCULAR | Qty: 10 | Status: AC

## 2011-08-20 MED FILL — Electrolyte-R (PH 7.4) Solution: INTRAVENOUS | Qty: 5000 | Status: AC

## 2011-08-20 MED FILL — Sodium Chloride IV Soln 0.9%: INTRAVENOUS | Qty: 1000 | Status: AC

## 2011-08-20 MED FILL — Heparin Sodium (Porcine) Inj 1000 Unit/ML: INTRAMUSCULAR | Qty: 30 | Status: AC

## 2011-08-20 MED FILL — Heparin Sodium (Porcine) Inj 1000 Unit/ML: INTRAMUSCULAR | Qty: 10 | Status: AC

## 2011-08-20 MED FILL — Potassium Chloride Inj 2 mEq/ML: INTRAVENOUS | Qty: 40 | Status: AC

## 2011-08-20 MED FILL — Lidocaine HCl IV Inj 20 MG/ML: INTRAVENOUS | Qty: 5 | Status: AC

## 2011-08-20 MED FILL — Mannitol IV Soln 20%: INTRAVENOUS | Qty: 500 | Status: AC

## 2011-08-20 MED FILL — Sodium Chloride Irrigation Soln 0.9%: Qty: 3000 | Status: AC

## 2011-08-20 NOTE — Addendum Note (Signed)
Addendum  created 08/20/11 1345 by Kipp Brood, MD   Modules edited:Notes Section

## 2011-08-20 NOTE — Progress Notes (Signed)
Anesthesiology Follow-up:  [name] is awake and alert, sitting in a chair, eating lunch. C/O mild chest soreness.  VS: T-36.5 BP-111/58 HR- 80 (atrially paced) RR 13 O2 Sat 97% on 2L  Glucose 170 H/H 10/30.6 Plts- 134,000 Na-141 K-4.7 BUN/Cr. 16/0.96  Extubated 5 hours post-op  Doing well, one day S/P MV repair with CABG X2.  No apparent complications.  [name] Brood, MD

## 2011-08-20 NOTE — Care Management Note (Unsigned)
    Page 1 of 1   08/20/2011     4:05:02 PM   CARE MANAGEMENT NOTE 08/20/2011  Patient:  [name] [name], [name] [name]   Account Number:  [id number]  Date Initiated:  08/20/2011  Documentation initiated by:  Euretha Najarro  Subjective/Objective Assessment:   PT S/P CABG X 2 AND MVR ON 08/19/11.  PTA, PT INDEPENDENT, LIVES WITH SPOUSE.     Action/Plan:   MET WITH PT TO DISCUSS DC PLANS.  WIFE TO PROVIDE CARE AT DISCHARGE.  WILL FOLLOW FOR HOME NEEDS AS PT PROGRESSES.   Anticipated DC Date:  08/24/2011   Anticipated DC Plan:  HOME W HOME HEALTH SERVICES      DC Planning Services  CM consult      Choice offered to / List presented to:             Status of service:  In process, will continue to follow Medicare Important Message given?   (If response is "NO", the following Medicare IM given date fields will be blank) Date Medicare IM given:   Date Additional Medicare IM given:    Discharge Disposition:    Per UR Regulation:    If discussed at Long Length of Stay Meetings, dates discussed:    Comments:

## 2011-08-20 NOTE — Progress Notes (Signed)
   CARDIOTHORACIC SURGERY PROGRESS NOTE   R1 Day Post-Op Procedure(s) (LRB): MITRAL VALVE REPAIR (MVR) (N/A) CORONARY ARTERY BYPASS GRAFTING (CABG) (N/A)  Subjective: Doing very well.  Mild soreness in chest and shoulders.  No SOB  Objective: Vital signs: BP Readings from Last 1 Encounters:  08/20/11 97/64   Pulse Readings from Last 1 Encounters:  08/20/11 80   Resp Readings from Last 1 Encounters:  08/20/11 11   Temp Readings from Last 1 Encounters:  08/20/11 99 F (37.2 C)     Hemodynamics: PAP: (28-54)/(13-27) 37/15 mmHg CO:  [3.5 L/min-5.3 L/min] 5.3 L/min CI:  [1.9 L/min/m2-2.8 L/min/m2] 2.8 L/min/m2  Physical Exam:  Rhythm:   sinus  Breath sounds: Clear w/ few rhonchi  Heart sounds:  RRR w/out murmur  Incisions:  Dressings dry  Abdomen:  soft  Extremities:  warm   Intake/Output from previous day: 08/01 0701 - 08/02 0700 In: 4499.2 [I.V.:2899.2; IV Piggyback:1600] Out: 6285 [Urine:3795; Blood:1770; Chest Tube:720] Intake/Output this shift: Total I/O In: 40 [I.V.:40] Out: 200 [Chest Tube:200]  Lab Results:  Basename 08/20/11 0400 08/19/11 2032 08/19/11 2019  WBC 18.0* -- 18.4*  HGB 10.0* 9.5* --  HCT 30.6* 28.0* --  PLT 134* -- 139*   BMET:  Basename 08/20/11 0400 08/19/11 2032 08/17/11 1005  NA 141 143 --  K 4.7 4.6 --  CL 110 109 --  CO2 24 -- 25  GLUCOSE 134* 133* --  BUN 16 16 --  CREATININE 0.96 1.00 --  CALCIUM 7.5* -- 9.2    CBG (last 3)   Basename 08/20/11 0746 08/20/11 0604 08/20/11 0352  GLUCAP 118* 120* 111*   ABG    Component Value Date/Time   PHART 7.312* 08/19/2011 1923   HCO3 22.8 08/19/2011 1923   TCO2 21 08/19/2011 2032   ACIDBASEDEF 3.0* 08/19/2011 1923   O2SAT 98.0 08/19/2011 1923   CXR: Looks good w/ mild bibasilar atelectasis  Assessment/Plan: S/P Procedure(s) (LRB): MITRAL VALVE REPAIR (MVR) (N/A) CORONARY ARTERY BYPASS GRAFTING (CABG) (N/A)  Doing very well POD1 Expected post op acute blood loss anemia, mild,  stable Expected post op volume excess, mild   Mobilize  D/C tubes and lines  Diuresis  Possible transfer step down later today if bed available  Start low dose coumadin  Restart prophylactic amiodarone   OWEN,CLARENCE H 08/20/2011 8:04 AM

## 2011-08-20 NOTE — Progress Notes (Signed)
UR Completed.  [name] [name] [phone] 08/20/2011

## 2011-08-21 ENCOUNTER — Inpatient Hospital Stay (HOSPITAL_COMMUNITY): Payer: Medicare Other

## 2011-08-21 LAB — PROTIME-INR: Prothrombin Time: 19 seconds — ABNORMAL HIGH (ref 11.6–15.2)

## 2011-08-21 LAB — CBC
Hemoglobin: 8.7 g/dL — ABNORMAL LOW (ref 13.0–17.0)
MCH: 29.5 pg (ref 26.0–34.0)
MCV: 90.2 fL (ref 78.0–100.0)
RBC: 2.95 MIL/uL — ABNORMAL LOW (ref 4.22–5.81)
WBC: 14.3 10*3/uL — ABNORMAL HIGH (ref 4.0–10.5)

## 2011-08-21 LAB — BASIC METABOLIC PANEL
CO2: 25 mEq/L (ref 19–32)
GFR calc non Af Amer: 52 mL/min — ABNORMAL LOW (ref >90)
Glucose, Bld: 120 mg/dL — ABNORMAL HIGH (ref 70–99)
Potassium: 4.2 mEq/L (ref 3.5–5.1)
Sodium: 139 mEq/L (ref 135–145)

## 2011-08-21 LAB — GLUCOSE, CAPILLARY: Glucose-Capillary: 126 mg/dL — ABNORMAL HIGH (ref 70–99)

## 2011-08-21 MED ORDER — INSULIN ASPART 100 UNIT/ML ~~LOC~~ SOLN
0.0000 [IU] | Freq: Three times a day (TID) | SUBCUTANEOUS | Status: DC
  Administered 2011-08-21 – 2011-08-29 (×17): 2 [IU] via SUBCUTANEOUS

## 2011-08-21 MED ORDER — MOVING RIGHT ALONG BOOK
Freq: Once | Status: AC
  Administered 2011-08-21: 14:00:00
  Filled 2011-08-21: qty 1

## 2011-08-21 MED ORDER — SODIUM CHLORIDE 0.9 % IV SOLN: 250.0000 mL | INTRAVENOUS | Status: DC | PRN

## 2011-08-21 MED ORDER — SIMVASTATIN 20 MG PO TABS
20.0000 mg | ORAL_TABLET | Freq: Every day | ORAL | Status: DC
  Administered 2011-08-21 – 2011-08-29 (×9): 20 mg via ORAL
  Filled 2011-08-21 (×11): qty 1

## 2011-08-21 MED ORDER — SODIUM CHLORIDE 0.9 % IJ SOLN: 3.0000 mL | INTRAMUSCULAR | Status: DC | PRN

## 2011-08-21 MED ORDER — SODIUM CHLORIDE 0.9 % IJ SOLN
3.0000 mL | Freq: Two times a day (BID) | INTRAMUSCULAR | Status: DC
  Administered 2011-08-21 – 2011-08-30 (×19): 3 mL via INTRAVENOUS

## 2011-08-21 NOTE — Progress Notes (Signed)
2 Days Post-Op Procedure(s) (LRB): MITRAL VALVE REPAIR (MVR) (N/A) CORONARY ARTERY BYPASS GRAFTING (CABG) (N/A) Subjective:CABG MV repair Slow sinus A-pacing CXR clear      tx to 2000  Objective: Vital signs in last 24 hours: Temp:  [97.9 F (36.6 C)-98.3 F (36.8 C)] 97.9 F (36.6 C) (08/03 0718) Pulse Rate:  [69-90] 90  (08/03 0740) Cardiac Rhythm:  [-] Atrial paced (08/03 0800) Resp:  [8-30] 23  (08/03 0700) BP: (84-131)/(47-76) 127/61 mmHg (08/03 0740) SpO2:  [95 %-99 %] 95 % (08/03 0700) Weight:  [166 lb 10.7 oz (75.6 kg)] 166 lb 10.7 oz (75.6 kg) (08/03 0500)  Hemodynamic parameters for last 24 hours:  stable  Intake/Output from previous day: 08/02 0701 - 08/03 0700 In: 744 [P.O.:240; I.V.:400; IV Piggyback:104] Out: 1680 [Urine:1340; Chest Tube:340] Intake/Output this shift: Total I/O In: 120 [P.O.:120] Out: -     Lab Results:  Basename 08/21/11 0435 08/20/11 1615  WBC 14.3* 14.6*  HGB 8.7* 9.4*  HCT 26.6* 28.6*  PLT 67* 88*   BMET:  Basename 08/21/11 0435 08/20/11 1615 08/20/11 1612 08/20/11 0400  NA 139 -- 139 --  K 4.2 -- 4.5 --  CL 106 -- 104 --  CO2 25 -- -- 24  GLUCOSE 120* -- 139* --  BUN 26* -- 20 --  CREATININE 1.26 1.20 -- --  CALCIUM 8.0* -- -- 7.5*    PT/INR:  Basename 08/21/11 0435  LABPROT 19.0*  INR 1.56*   ABG    Component Value Date/Time   PHART 7.312* 08/19/2011 1923   HCO3 22.8 08/19/2011 1923   TCO2 24 08/20/2011 1612   ACIDBASEDEF 3.0* 08/19/2011 1923   O2SAT 98.0 08/19/2011 1923   CBG (last 3)   Basename 08/21/11 0716 08/20/11 2204 08/20/11 1930  GLUCAP 128* 154* 155*    Assessment/Plan: S/P Procedure(s) (LRB): MITRAL VALVE REPAIR (MVR) (N/A) CORONARY ARTERY BYPASS GRAFTING (CABG) (N/A) Plan for transfer to step-down: see transfer orders   LOS: 2 days    [name] [name],[name] [name] 08/21/2011

## 2011-08-21 NOTE — Progress Notes (Signed)
2 Days Post-Op Procedure(s) (LRB): MITRAL VALVE REPAIR (MVR) (N/A) CORONARY ARTERY BYPASS GRAFTING (CABG) (N/A) Subjective status post CABG and mitral valve repair Stable night maintaining slow sinus rhythm atrially paced at 90 with good blood pressure Chest x-ray minimal atelectasis ambulating in the hallway Ready for transfer to step down  Objective: Vital signs in last 24 hours: Temp:  [97.9 F (36.6 C)-98.3 F (36.8 C)] 97.9 F (36.6 C) (08/03 0718) Pulse Rate:  [69-90] 90  (08/03 0740) Cardiac Rhythm:  [-] Atrial paced (08/03 0800) Resp:  [8-27] 23  (08/03 0700) BP: (84-127)/(47-76) 127/61 mmHg (08/03 0740) SpO2:  [95 %-99 %] 95 % (08/03 0700) Weight:  [166 lb 10.7 oz (75.6 kg)] 166 lb 10.7 oz (75.6 kg) (08/03 0500)  Hemodynamic parameters for last 24 hours:   stable  Intake/Output from previous day: 08/02 0701 - 08/03 0700 In: 744 [P.O.:240; I.V.:400; IV Piggyback:104] Out: 1680 [Urine:1340; Chest Tube:340] Intake/Output this shift: Total I/O In: 120 [P.O.:120] Out: -   Lungs clear Extremities warm Abdomen soft  Lab Results:  Basename 08/21/11 0435 08/20/11 1615  WBC 14.3* 14.6*  HGB 8.7* 9.4*  HCT 26.6* 28.6*  PLT 67* 88*   BMET:  Basename 08/21/11 0435 08/20/11 1615 08/20/11 1612 08/20/11 0400  NA 139 -- 139 --  K 4.2 -- 4.5 --  CL 106 -- 104 --  CO2 25 -- -- 24  GLUCOSE 120* -- 139* --  BUN 26* -- 20 --  CREATININE 1.26 1.20 -- --  CALCIUM 8.0* -- -- 7.5*    PT/INR:  Basename 08/21/11 0435  LABPROT 19.0*  INR 1.56*   ABG    Component Value Date/Time   PHART 7.312* 08/19/2011 1923   HCO3 22.8 08/19/2011 1923   TCO2 24 08/20/2011 1612   ACIDBASEDEF 3.0* 08/19/2011 1923   O2SAT 98.0 08/19/2011 1923   CBG (last 3)   Basename 08/21/11 0716 08/20/11 2204 08/20/11 1930  GLUCAP 128* 154* 155*    Assessment/Plan: S/P Procedure(s) (LRB): MITRAL VALVE REPAIR (MVR) (N/A) CORONARY ARTERY BYPASS GRAFTING (CABG) (N/A) Plan for transfer to step-down: see  transfer orders Continue oral amiodarone, Coumadin  LOS: 2 days    VAN TRIGT III,PETER 08/21/2011

## 2011-08-22 ENCOUNTER — Inpatient Hospital Stay (HOSPITAL_COMMUNITY): Payer: Medicare Other

## 2011-08-22 LAB — BASIC METABOLIC PANEL
BUN: 21 mg/dL (ref 6–23)
CO2: 28 mEq/L (ref 19–32)
Calcium: 8 mg/dL — ABNORMAL LOW (ref 8.4–10.5)
Chloride: 103 mEq/L (ref 96–112)
Creatinine, Ser: 0.98 mg/dL (ref 0.50–1.35)
GFR calc Af Amer: 88 mL/min — ABNORMAL LOW (ref >90)
GFR calc non Af Amer: 76 mL/min — ABNORMAL LOW (ref >90)
Glucose, Bld: 122 mg/dL — ABNORMAL HIGH (ref 70–99)
Potassium: 3.9 mEq/L (ref 3.5–5.1)
Sodium: 137 mEq/L (ref 135–145)

## 2011-08-22 LAB — GLUCOSE, CAPILLARY
Glucose-Capillary: 135 mg/dL — ABNORMAL HIGH (ref 70–99)
Glucose-Capillary: 120 mg/dL — ABNORMAL HIGH (ref 70–99)
Glucose-Capillary: 116 mg/dL — ABNORMAL HIGH (ref 70–99)

## 2011-08-22 LAB — CBC
HCT: 25 % — ABNORMAL LOW (ref 39.0–52.0)
Hemoglobin: 8.2 g/dL — ABNORMAL LOW (ref 13.0–17.0)
MCH: 29.7 pg (ref 26.0–34.0)
MCHC: 32.8 g/dL (ref 30.0–36.0)
MCV: 90.6 fL (ref 78.0–100.0)
Platelets: 64 10*3/uL — ABNORMAL LOW (ref 150–400)
RBC: 2.76 MIL/uL — ABNORMAL LOW (ref 4.22–5.81)
RDW: 14.4 % (ref 11.5–15.5)
WBC: 12.9 10*3/uL — ABNORMAL HIGH (ref 4.0–10.5)

## 2011-08-22 NOTE — Progress Notes (Addendum)
                    [address]            [place] [address]          [phone]     3 Days Post-Op Procedure(s) (LRB): MITRAL VALVE REPAIR (MVR) (N/A) CORONARY ARTERY BYPASS GRAFTING (CABG) (N/A)  Subjective: Comfortable, no complaints.  Objective: Vital signs in last 24 hours: Patient Vitals for the past 24 hrs:  BP Temp Temp src Pulse Resp SpO2 Height Weight  08/22/11 0500 - - - - - - - 164 lb 7.4 oz (74.6 kg)  08/22/11 0444 120/54 mmHg 98.4 F (36.9 C) Oral 80  18  91 % - -  08/21/11 2111 107/50 mmHg 98 F (36.7 C) Oral 90  18  93 % - -  08/21/11 1900 - - - - - - 5\' 8"  (1.727 m) -  08/21/11 1408 - - - - - - - 174 lb 9.6 oz (79.198 kg)  08/21/11 1359 126/57 mmHg 97.8 F (36.6 C) - 90  18  99 % - -  08/21/11 1300 110/52 mmHg - - 90  21  95 % - -  08/21/11 1214 - 98.3 F (36.8 C) Oral - - - - -  08/21/11 1100 119/57 mmHg - - 89  24  98 % - -  08/21/11 1000 103/53 mmHg - - 90  19  97 % - -  08/21/11 0900 111/46 mmHg - - 90  20  98 % - -   Current Weight  08/22/11 164 lb 7.4 oz (74.6 kg)     Intake/Output from previous day: 08/03 0701 - 08/04 0700 In: 700 [P.O.:700] Out: 600 [Urine:600]  CBGs 120-151-122  PHYSICAL EXAM:  Heart: RRR, paced at 80 Lungs: clear Wound: clean and dry Extremities: mild LE edema    Lab Results: CBC: Basename 08/22/11 0500 08/21/11 0435  WBC 12.9* 14.3*  HGB 8.2* 8.7*  HCT 25.0* 26.6*  PLT 64* 67*   BMET:  Basename 08/22/11 0500 08/21/11 0435  NA 137 139  K 3.9 4.2  CL 103 106  CO2 28 25  GLUCOSE 122* 120*  BUN 21 26*  CREATININE 0.98 1.26  CALCIUM 8.0* 8.0*    PT/INR:  Basename 08/21/11 0435  LABPROT 19.0*  INR 1.56*   CXR:  Findings: Previous CABG. Stable mild cardiomegaly. Improved  aeration with residual patchy interstitial airspace opacities in  the lung bases, left greater than right. Question small pleural  effusions.  IMPRESSION:  1. Partial improvement in bibasilar  infiltrate/atelectasis, left  greater than right   Assessment/Plan: S/P Procedure(s) (LRB): MITRAL VALVE REPAIR (MVR) (N/A) CORONARY ARTERY BYPASS GRAFTING (CABG) (N/A) CV- A-paced at 80 for bradycardia. BPs stable.  Continue Amio, Coumadin. Start low dose beta blocker when able. Vol overload- diurese. CRPI, pulm toilet.   LOS: 3 days    COLLINS,GINA H 08/22/2011 patient examined and medical record reviewed,agree with above note. VAN TRIGT III,PETER 08/22/2011

## 2011-08-23 LAB — GLUCOSE, CAPILLARY

## 2011-08-23 LAB — PROTIME-INR: INR: 2.15 — ABNORMAL HIGH (ref 0.00–1.49)

## 2011-08-23 MED ORDER — WARFARIN SODIUM 1 MG PO TABS
1.0000 mg | ORAL_TABLET | Freq: Every day | ORAL | Status: DC
  Administered 2011-08-23: 1 mg via ORAL
  Filled 2011-08-23 (×2): qty 1

## 2011-08-23 NOTE — Progress Notes (Addendum)
4 Days Post-Op  Procedure(s) (LRB): MITRAL VALVE REPAIR (MVR) (N/A) CORONARY ARTERY BYPASS GRAFTING (CABG) (N/A) Subjective: Feels ok,minimal pain, minimal SOB  Objective  Telemetry a paced  Temp:  [97.7 F (36.5 C)-98.2 F (36.8 C)] 97.7 F (36.5 C) (08/05 0504) Pulse Rate:  [77-80] 77  (08/05 0504) Resp:  [18] 18  (08/05 0504) BP: (110-126)/(59-72) 126/72 mmHg (08/05 0504) SpO2:  [92 %] 92 % (08/05 0504) Weight:  [164 lb 3.9 oz (74.5 kg)] 164 lb 3.9 oz (74.5 kg) (08/05 0504)   Intake/Output Summary (Last 24 hours) at 08/23/11 0731 Last data filed at 08/23/11 0100  Gross per 24 hour  Intake    480 ml  Output    950 ml  Net   -470 ml       General appearance: alert, cooperative and no distress Heart: regular rate and rhythm and S1, S2 normal Lungs: mildly diminished in bases Abdomen: soft, nontender, + BS Extremities: minor edema Wound: incisions healing well  Lab Results:  Basename 08/22/11 0500 08/21/11 0435 08/20/11 1615  NA 137 139 --  K 3.9 4.2 --  CL 103 106 --  CO2 28 25 --  GLUCOSE 122* 120* --  BUN 21 26* --  CREATININE 0.98 1.26 --  CALCIUM 8.0* 8.0* --  MG -- -- 2.6*  PHOS -- -- --   No results found for this basename: AST:2,ALT:2,ALKPHOS:2,BILITOT:2,PROT:2,ALBUMIN:2 in the last 72 hours No results found for this basename: LIPASE:2,AMYLASE:2 in the last 72 hours  Basename 08/22/11 0500 08/21/11 0435  WBC 12.9* 14.3*  NEUTROABS -- --  HGB 8.2* 8.7*  HCT 25.0* 26.6*  MCV 90.6 90.2  PLT 64* 67*   No results found for this basename: CKTOTAL:4,CKMB:4,TROPONINI:4 in the last 72 hours No components found with this basename: POCBNP:3 No results found for this basename: DDIMER in the last 72 hours No results found for this basename: HGBA1C in the last 72 hours No results found for this basename: CHOL,HDL,LDLCALC,TRIG,CHOLHDL in the last 72 hours No results found for this basename: TSH,T4TOTAL,FREET3,T3FREE,THYROIDAB in the last 72 hours No  results found for this basename: VITAMINB12,FOLATE,FERRITIN,TIBC,IRON,RETICCTPCT in the last 72 hours  Medications: Scheduled    . acetaminophen  1,000 mg Oral Q6H  . amiodarone  200 mg Oral BID PC  . aspirin EC  81 mg Oral Daily  . bisacodyl  10 mg Oral Daily   Or  . bisacodyl  10 mg Rectal Daily  . carvedilol  3.125 mg Oral BID WC  . docusate sodium  200 mg Oral Daily  . furosemide  40 mg Oral Daily  . insulin aspart  0-24 Units Subcutaneous TID AC & HS  . niacin  1,000 mg Oral QHS  . pantoprazole  40 mg Oral Q1200  . potassium chloride  20 mEq Oral Daily  . simvastatin  20 mg Oral q1800  . sodium chloride  3 mL Intravenous Q12H  . warfarin  2.5 mg Oral q1800  . Warfarin - Physician Dosing Inpatient   Does not apply q1800     Radiology/Studies:  Dg Chest 2 View  08/22/2011  *RADIOLOGY REPORT*  Clinical Data: Shortness of breath  CHEST - 2 VIEW  Comparison:   the previous day's study  Findings: Previous CABG.  Stable mild cardiomegaly.  Improved aeration with residual patchy interstitial airspace opacities in the lung bases, left greater than right.  Question small pleural effusions.  IMPRESSION:  1.  Partial improvement in bibasilar  infiltrate/atelectasis, left greater than right  Original Report Authenticated By: Osa Craver, M.D.    INR:2.15 Will add last result for INR, ABG once components are confirmed Will add last 4 CBG results once components are confirmed  Assessment/Plan: S/P Procedure(s) (LRB): MITRAL VALVE REPAIR (MVR) (N/A) CORONARY ARTERY BYPASS GRAFTING (CABG) (N/A)  1. Doing well overall 2 conts apacing, will have to see what's underneath, may need beta blocker/amiodarone adjustment 3 cont diuresis 4 cont pulm toilet/rehab 5 cont ac rx, but give 1mg  coumadin today 6 cbg's controlled  LOS: 4 days    GOLD,WAYNE E 8/5/20137:31 AM    Chart reviewed, patient examined, agree with above. Rhythm looks sinus 72 under pacer. Will turn off now and  leave attached to patient and monitor rhythm today.

## 2011-08-23 NOTE — Progress Notes (Signed)
CARDIAC REHAB PHASE I   PRE:  Rate/Rhythm: 80 paced    BP: sitting 132/59    SaO2: 9196 RA  MODE:  Ambulation: 700 ft   POST:  Rate/Rhythm: 80 paced    BP: sitting 136/64     SaO2: 95 RA  Tolerated very well. Steady, quick pace. Not exerted at all. Pt very quiet. Remained 80 paced. Gave new IS (first one was lost). Can try without RW next walk. [id number]  [name] [name] CES, ACSM

## 2011-08-24 LAB — GLUCOSE, CAPILLARY
Glucose-Capillary: 97 mg/dL (ref 70–99)
Glucose-Capillary: 114 mg/dL — ABNORMAL HIGH (ref 70–99)

## 2011-08-24 LAB — PROTIME-INR
INR: 3.08 — ABNORMAL HIGH (ref 0.00–1.49)
Prothrombin Time: 32.3 seconds — ABNORMAL HIGH (ref 11.6–15.2)

## 2011-08-24 LAB — BASIC METABOLIC PANEL
Calcium: 8.2 mg/dL — ABNORMAL LOW (ref 8.4–10.5)
GFR calc non Af Amer: 79 mL/min — ABNORMAL LOW (ref >90)
Sodium: 134 mEq/L — ABNORMAL LOW (ref 135–145)

## 2011-08-24 LAB — MAGNESIUM: Magnesium: 2 mg/dL (ref 1.5–2.5)

## 2011-08-24 MED ORDER — SIMVASTATIN 20 MG PO TABS: 20.0000 mg | ORAL_TABLET | Freq: Every day | ORAL | Status: DC

## 2011-08-24 MED ORDER — AMIODARONE HCL 200 MG PO TABS: 200.0000 mg | ORAL_TABLET | Freq: Two times a day (BID) | ORAL | Status: DC

## 2011-08-24 MED ORDER — WARFARIN SODIUM 2.5 MG PO TABS: 2.5000 mg | ORAL_TABLET | Freq: Every day | ORAL | Status: DC

## 2011-08-24 MED ORDER — TRAMADOL HCL 50 MG PO TABS: 50.0000 mg | ORAL_TABLET | ORAL | Status: AC | PRN

## 2011-08-24 NOTE — Progress Notes (Signed)
Pt had 42 beat sustained v-tach, asymptomatic, VSS. PA on floor made aware. New orders received, will continue to monitor closely.  [name] [name]

## 2011-08-24 NOTE — Progress Notes (Signed)
[facility] CARDIAC REHAB PHASE I   PRE:  Rate/Rhythm: 66 SR  BP:  Supine:   Sitting: 120/50  Standing:    SaO2: 94  MODE:  Ambulation: 890 ft   POST:  Rate/Rhythem: 76 JR vs SR  BP:  Supine:   Sitting: 130/70  Standing:  0940-1015  SaO2: 93 RA Assisted X 1 to ambulate with hand held assist. Gait a little wobbly, but pt able to correct getting off balance. VS stable. Pt without c/o when walking. Discussed Outpt CRP with pt he agrees to letter of interest to be sent to [place].  [name] [name]

## 2011-08-24 NOTE — Discharge Summary (Signed)
[address]            [place] [address]          [phone]         Discharge Summary  Name: [name] [name] DOB: 1930/01/06 78 y.o. MRN: [id number]   Admission Date: 08/19/2011 Discharge Date: 08/30/2011    Admitting Diagnosis: Severe mitral regurgitation Mitral valve prolapse 2 vessel coronary artery disease   Discharge Diagnosis:  Severe mitral regurgitation Mitral valve prolapse 2 vessel coronary artery disease Postoperative atrial fibrillation Postoperative nonsustained ventricular tachycardia  Past Medical History  Diagnosis Date  . MR (mitral regurgitation)   . MVP (mitral valve prolapse)   . Cataract   . Dermatochalasis   . Dyslipidemia     takes Niacin daily  . Hypercholesterolemia   . Systolic murmur   . Hx: recurrent pneumonia   . PVC (premature ventricular contraction)   . Pseudophakia   . Cancer     colon cancer 1984  . Coronary artery disease 08/10/2011  . CHF (congestive heart failure)     takes Lasix daily  . Pneumonia     early July 2013  . Shortness of breath     with exertion/lying/sitting  . History of seasonal allergies     takes OTC allergy meds prn  . Arthritis     hands  . H/O hiatal hernia   . Diverticulosis   . History of colon polyps   . Urinary frequency   . Urinary urgency   . History of kidney stones   . Thyroid nodule 08/18/2011    3 cm nodule discovered on chest CT scan  . S/P mitral valve repair 08/19/2011    Complex valvuloplasty including triangular resection of posterior leaflet, artificial Goretex neocord placement x6 and 38mm Sorin Memo 3D ring annuloplasty  . S/P CABG x 2 08/19/2011    LIMA to LAD, SVG to RCA, EVH via left thigh       Procedures:   MITRAL VALVE REPAIR   Complex valvuloplasty including triangular resection of flail posterior leaflet   Gore-tex neocord placement x6   Sorin Memo 3D ring annuloplasty (size 38mm, catalog #[id number], serial  [id number])  CORONARY ARTERY BYPASS GRAFTING x 2    Left internal mammary artery to distal left anterior descending  Saphenous vein graft to distal right coronary artery  Endoscopic vein harvest left thigh  -08/19/2011    HPI:  The patient is a 78 y.o. male referred to Dr. Cornelius Moras by Dr. Rhona Leavens for evaluation of recently diagnosed mitral valve prolapse and mitral regurgitation. The patient has been relatively physically active until recently. Several weeks ago, the patient developed relatively sudden onset of exertional shortness of breath and orthopnea associated with dry nonproductive cough. These symptoms became quite severe, prompting him to go for evaluation at [facility]. Chest x-ray was performed demonstrating bilateral pleural effusions and opacity consistent with congestive heart failure versus pneumonia. The patient was treated for presumed pneumonia, but seen in followup by his primary care physician, where he was noted to have a loud murmur on physical exam. He was referred to Dr. Rhona Leavens, who performed transthoracic echocardiogram on 08/05/2011. By report, this demonstrated severe prolapse of the posterior leaflet of the mitral valve with severe mitral regurgitation and severe left atrial enlargement. There is normal left ventricular systolic function. The patient was started on medical therapy for congestive  heart failure, and his symptoms have improved somewhat. However, the patient continues to experience exertional shortness of breath and orthopnea. He subsequently underwent both transesophageal echocardiogram and left and right heart catheterization on 08/10/2011. These confirmed the presence of mitral valve prolapse with flail segment of the posterior leaflet of the mitral valve and severe mitral regurgitation. The patient was found to have moderate two-vessel coronary artery disease. He was seen in surgical consultation by Dr. Cornelius Moras, who felt he would benefit from elective mitral  valve repair and coronary artery bypass grafting.  All risks, benefits and alternatives of surgery were explained in detail, and the patient agreed to proceed.      Hospital Course:  The patient was admitted to [facility] on 8/1/2013The patient was taken to the operating room and underwent the above procedure.    The postoperative course has been notable for bradycardia, both junctional rhythm and slow sinus rhythm, which initially required atrial pacing. His pacer was ultimately discontinued, and he was able to be started on a low-dose beta blocker as well as continued on amiodarone.  He was noted on 08/25/2011 to have several runs of nonsustained ventricular tachycardia, as well as atrial fibrillation with controlled rates.  An electrophysiology consult was obtained to assist with rhythm management.  Dr. Sharrell Ku saw the patient, and he recommended continued conservative care.  He was started on Coumadin, and his INR became supratherapeutic on very low doses. He required a single dose of vitamin K and several doses of Coumadin were held to correct his INR.  Currently, he is stable on 1 mg daily.    He has otherwise done fairly well. He has been mildly volume overloaded postoperatively and was started on Lasix, to which he responded well. He is ambulating in the halls without difficulty. He is tolerating a regular diet. He has remained afebrile and vital signs and stable. He did develop a pruritic rash, which he attributed to Niacin administration.  This was subsequently discontinued.  He was treated symptomatically with Benadryl for itching, and the rash has resolved.  He has been seen on today's date, and has been deemed ready for discharge home.    Recent vital signs:  Wt Readings from Last 3 Encounters:  08/30/11 154 lb 4.8 oz (69.99 kg)  08/30/11 154 lb 4.8 oz (69.99 kg)  08/17/11 160 lb 1.6 oz (72.621 kg)   Temp Readings from Last 3 Encounters:  08/30/11 98.5 F (36.9 C) Oral  08/30/11  98.5 F (36.9 C) Oral  08/17/11 98.3 F (36.8 C)    BP Readings from Last 3 Encounters:  08/30/11 131/71  08/30/11 131/71  08/17/11 123/83   Pulse Readings from Last 3 Encounters:  08/30/11 71  08/30/11 71  08/17/11 73     Recent laboratory studies:  CBC: Lab Results  Component Value Date   WBC 11.7* 08/25/2011   HGB 9.1* 08/25/2011   HCT 27.4* 08/25/2011   MCV 90.7 08/25/2011   PLT 138* 08/25/2011     BMET:  Lab Results  Component Value Date   CREATININE 1.07 08/25/2011   BUN 18 08/25/2011   NA 139 08/25/2011   K 4.6 08/25/2011   CL 102 08/25/2011   CO2 25 08/25/2011     PT/INR:  Lab Results  Component Value Date   INR 2.70* 08/30/2011   INR 2.47* 08/29/2011   INR 2.83* 08/28/2011      Discharge Medications:   Medication List  As of 08/30/2011  9:10 AM  STOP taking these medications         niacin 500 MG tablet      predniSONE 50 MG tablet         TAKE these medications         amiodarone 200 MG tablet   Commonly known as: PACERONE   Take 1 tablet (200 mg total) by mouth 2 (two) times daily.      aspirin EC 81 MG tablet   Take 81 mg by mouth daily.      carvedilol 3.125 MG tablet   Commonly known as: COREG   Take 3.125 mg by mouth 2 (two) times daily with a meal.      furosemide 40 MG tablet   Commonly known as: LASIX   Take 40 mg by mouth every morning.      potassium chloride SA 20 MEQ tablet   Commonly known as: K-DUR,KLOR-CON   Take 20 mEq by mouth 2 (two) times daily.      simvastatin 20 MG tablet   Commonly known as: ZOCOR   Take 1 tablet (20 mg total) by mouth at bedtime.      traMADol 50 MG tablet   Commonly known as: ULTRAM   Take 1-2 tablets (50-100 mg total) by mouth every 4 (four) hours as needed for pain.      warfarin 1 MG tablet   Commonly known as: COUMADIN   Take 1 tablet (1 mg total) by mouth daily at 6 PM.              Discharge Instructions:  The patient is to refrain from driving, heavy lifting or strenuous activity.   May shower daily and clean incisions with soap and water.  May resume regular diet.     Follow Up:  Discharge Orders    Future Appointments: Provider: Department: Dept Phone: Center:   09/13/2011 2:00 PM [facility] [phone] [facility]      Follow-up Information    Follow up with Purcell Nails, MD on 09/13/2011. (Have a chest x-ray at 1:00, then see Dr. Orvan July PA at 2:00)    Contact information:   [address] [place] [address] [phone]       Follow up with Holley Raring, MD on 09/07/2011. (Appointment is at 2:00)    Contact information:   [address] [place] [address] [phone]       Please follow up. (Have a PT/INR checked at Dr. Johnna Acosta office on  8/14 -call for appointment time)          Kenyonna Micek H 08/24/2011, 10:12 AM

## 2011-08-24 NOTE — Progress Notes (Addendum)
[address]            [place] [address]          [phone]     5 Days Post-Op  Procedure(s) (LRB): MITRAL VALVE REPAIR (MVR) (N/A) CORONARY ARTERY BYPASS GRAFTING (CABG) (N/A) Subjective: Feels well  Objective  Telemetry junctional in 70's with pvc's , couplets  Temp:  [97.6 F (36.4 C)-99.1 F (37.3 C)] 98.7 F (37.1 C) (08/06 0430) Pulse Rate:  [68-74] 72  (08/06 0430) Resp:  [18-20] 18  (08/06 0430) BP: (102-123)/(62-65) 102/62 mmHg (08/06 0430) SpO2:  [94 %-95 %] 95 % (08/06 0430) Weight:  [159 lb 9.6 oz (72.394 kg)] 159 lb 9.6 oz (72.394 kg) (08/06 0430)   Intake/Output Summary (Last 24 hours) at 08/24/11 0801 Last data filed at 08/24/11 0600  Gross per 24 hour  Intake    360 ml  Output    350 ml  Net     10 ml       General appearance: alert, cooperative and no distress Heart: regular rate and rhythm and S1, S2 normal Lungs: mildly diminished in bases Abdomen: benign Extremities: no edema Wound: incisions healing well  Lab Results:  Basename 08/22/11 0500  NA 137  K 3.9  CL 103  CO2 28  GLUCOSE 122*  BUN 21  CREATININE 0.98  CALCIUM 8.0*  MG --  PHOS --   No results found for this basename: AST:2,ALT:2,ALKPHOS:2,BILITOT:2,PROT:2,ALBUMIN:2 in the last 72 hours No results found for this basename: LIPASE:2,AMYLASE:2 in the last 72 hours  Basename 08/22/11 0500  WBC 12.9*  NEUTROABS --  HGB 8.2*  HCT 25.0*  MCV 90.6  PLT 64*   No results found for this basename: CKTOTAL:4,CKMB:4,TROPONINI:4 in the last 72 hours No components found with this basename: POCBNP:3 No results found for this basename: DDIMER in the last 72 hours No results found for this basename: HGBA1C in the last 72 hours No results found for this basename: CHOL,HDL,LDLCALC,TRIG,CHOLHDL in the last 72 hours No results found for this basename: TSH,T4TOTAL,FREET3,T3FREE,THYROIDAB in the last 72 hours No results found for this basename:  VITAMINB12,FOLATE,FERRITIN,TIBC,IRON,RETICCTPCT in the last 72 hours  Medications: Scheduled    . acetaminophen  1,000 mg Oral Q6H  . amiodarone  200 mg Oral BID PC  . aspirin EC  81 mg Oral Daily  . bisacodyl  10 mg Oral Daily   Or  . bisacodyl  10 mg Rectal Daily  . carvedilol  3.125 mg Oral BID WC  . docusate sodium  200 mg Oral Daily  . furosemide  40 mg Oral Daily  . insulin aspart  0-24 Units Subcutaneous TID AC & HS  . niacin  1,000 mg Oral QHS  . pantoprazole  40 mg Oral Q1200  . potassium chloride  20 mEq Oral Daily  . simvastatin  20 mg Oral q1800  . sodium chloride  3 mL Intravenous Q12H  . warfarin  1 mg Oral q1800  . Warfarin - Physician Dosing Inpatient   Does not apply q1800     Radiology/Studies:  No results found.  INR:3.08 Will add last result for INR, ABG once components are confirmed Will add last 4 CBG results once components are confirmed  Assessment/Plan: S/P Procedure(s) (LRB): MITRAL VALVE REPAIR (MVR) (N/A) CORONARY ARTERY BYPASS GRAFTING (CABG) (N/A)  1 doing well 2 no coumadin today 3 may have to consider  d/c amio as may be pro-arrythmic and in junct rhythm, check QTc 4 recheck labs in am, poss home 1-2 days  LOS: 5 days    GOLD,WAYNE E 8/6/20138:01 AM   INR 3.08 I have seen and examined Chauncey Cruel and agree with the above assessment  and plan.  Delight Ovens MD Beeper [phone] Office [phone] 08/24/2011 12:23 PM

## 2011-08-25 DIAGNOSIS — I472 Ventricular tachycardia: Secondary | ICD-10-CM

## 2011-08-25 LAB — BASIC METABOLIC PANEL
CO2: 25 mEq/L (ref 19–32)
Calcium: 8.6 mg/dL (ref 8.4–10.5)
Chloride: 102 mEq/L (ref 96–112)
Creatinine, Ser: 1.07 mg/dL (ref 0.50–1.35)
Glucose, Bld: 108 mg/dL — ABNORMAL HIGH (ref 70–99)
Sodium: 139 mEq/L (ref 135–145)

## 2011-08-25 LAB — GLUCOSE, CAPILLARY
Glucose-Capillary: 93 mg/dL (ref 70–99)
Glucose-Capillary: 126 mg/dL — ABNORMAL HIGH (ref 70–99)
Glucose-Capillary: 117 mg/dL — ABNORMAL HIGH (ref 70–99)
Glucose-Capillary: 140 mg/dL — ABNORMAL HIGH (ref 70–99)

## 2011-08-25 LAB — CBC
Hemoglobin: 9.1 g/dL — ABNORMAL LOW (ref 13.0–17.0)
MCH: 30.1 pg (ref 26.0–34.0)
MCV: 90.7 fL (ref 78.0–100.0)
RBC: 3.02 MIL/uL — ABNORMAL LOW (ref 4.22–5.81)
WBC: 11.7 10*3/uL — ABNORMAL HIGH (ref 4.0–10.5)

## 2011-08-25 LAB — HEPATIC FUNCTION PANEL
AST: 49 U/L — ABNORMAL HIGH (ref 0–37)
Bilirubin, Direct: 0.2 mg/dL (ref 0.0–0.3)

## 2011-08-25 NOTE — Progress Notes (Signed)
CARDIAC REHAB PHASE I   PRE:  Rate/Rhythm: 80 JR    BP: sitting 120/59    SaO2:   MODE:  Ambulation: 1100 ft   POST:  Rate/Rhythm: 87 JR (no VT)    BP: sitting 135/69     SaO2: 98 RA  Tolerated well. No VT. Felt good. Slight unsteadiness without RW at times. Return to chair.  [id number]  Harriet Masson CES, ACSM

## 2011-08-25 NOTE — Consult Note (Signed)
ELECTROPHYSIOLOGY CONSULT NOTE  Patient ID: [name] [name] MRN: [id number], DOB/AGE: 1929/10/02   Admit date: 08/19/2011 Date of Consult: 08/25/2011  Primary Physician: Arletta Bale, MD Primary Cardiologist: Holley Raring, MD, [facility] Cardiology Reason for Consultation: Ventricular tachycardia  History of Present Illness [name] [name] is a pleasant 78 year old gentleman with recently diagnosed MVP with severe MR and CAD who was referred to Dr. Tressie Stalker for surgical recommendations. He subsequently underwent complex mitral vavluloplasty with 33mm Sorin ring and CABG x2 with LIMA to LAD, SVG to dRCA on 08/19/2011. Overnight, he had one episode of NSVT; therefore, we have been asked to see him in consultation for EP recommendations. Of note, [name] [name] has preserved LV function by pre-op cardiac catheterization. [name] [name] denies any complaints currently and has been ambulating without difficulty. Yesterday's episode occurred while he was lying in bed for a 12-lead ECG. He was unaware of his arrhythmia. He denies CP, SOB, palpitations, dizziness or syncope. He has no history of syncope.   Past Medical History  Diagnosis Date  . MR (mitral regurgitation)   . MVP (mitral valve prolapse)   . Cataract   . Dermatochalasis   . Dyslipidemia     takes Niacin daily  . Hypercholesterolemia   . Systolic murmur   . Hx of recurrent pneumonia   . PVC (premature ventricular contraction)   . Pseudophakia   . Cancer     colon cancer 1984  . Coronary artery disease 08/10/2011  . CHF (congestive heart failure)     takes Lasix daily  . Pneumonia     early July 2013  . History of seasonal allergies   . Arthritis   . Hiatal hernia   . Diverticulosis   . History of colon polyps   . History of kidney stones   . Thyroid nodule 08/18/2011    3 cm nodule discovered on chest CT scan  . S/P mitral valve repair 08/19/2011    Complex valvuloplasty including triangular resection of posterior leaflet,  artificial Goretex neocord placement x6 and 38mm Sorin Memo 3D ring annuloplasty  . S/P CABG x 2 08/19/2011    LIMA to LAD, SVG to RCA, EVH via left thigh    Past Surgical History  Procedure Date  . Bunionectomy   . Cataract extraction     bilateral  . Cystoscopy     with manipulation of Ureteral Calculus  . Lithotripsy     Whole body extracorporeal shock wave  . Lithotripsy     Left ESL PUA GPE 01/03/2006,04/03/2006  . Partial colecotomy 1984  . Hernia repair   . Cardiac catheterization 08/10/11  . Colonoscopy   . Esophagogastroduodenoscopy     with dilitation  . Mitral valve repair 08/19/2011    Procedure: MITRAL VALVE REPAIR (MVR);  Surgeon: Purcell Nails, MD;  Location: [facility] OR;  Service: Open Heart Surgery;  Laterality: N/A;  . Coronary artery bypass graft 08/19/2011    Procedure: CORONARY ARTERY BYPASS GRAFTING (CABG);  Surgeon: Purcell Nails, MD;  Location: [facility] OR;  Service: Open Heart Surgery;  Laterality: N/A;  Coronary Artery Bypass Grafting times two using left internal mammary artery and left greater saphenous vein endoscopiclly harvested    Allergies/Intolerances Allergen Reactions  . Ivp Dye (Iodinated Diagnostic Agents) Rash   Inpatient Medications . acetaminophen  1,000 mg Oral Q6H  . amiodarone  200 mg Oral BID PC  . aspirin EC  81 mg Oral Daily  . bisacodyl  10 mg  Oral Daily   Or  . bisacodyl  10 mg Rectal Daily  . carvedilol  3.125 mg Oral BID WC  . docusate sodium  200 mg Oral Daily  . furosemide  40 mg Oral Daily  . insulin aspart  0-24 Units Subcutaneous TID AC & HS  . niacin  1,000 mg Oral QHS  . pantoprazole  40 mg Oral Q1200  . potassium chloride  20 mEq Oral Daily  . simvastatin  20 mg Oral q1800  . sodium chloride  3 mL Intravenous Q12H   Family History Problem Relation Age of Onset  . Heart disease Father   . Cancer Mother   . Glaucoma Sister   . Prostate cancer Paternal side of family     Social History Social History  . Marital Status:  Married    Spouse Name: N/A    Number of Children: 2  . Years of Education: N/A   Occupational History  . 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 Smoking status: Former Smoker    Types: Cigarettes  . Smokeless tobacco: Not on file   Comment: quit 16yrs ago  . Alcohol Use: No  . Drug Use: No  . Sexually Active: Yes   Review of Systems General: No chills, fever, night sweats or weight changes  Cardiovascular: No chest pain, dyspnea on exertion, edema, orthopnea, palpitations, paroxysmal nocturnal dyspnea Dermatological: No rash, lesions or masses Respiratory: No cough, dyspnea Urologic: No hematuria, dysuria Abdominal: No nausea, vomiting, diarrhea, bright red blood per rectum, melena, or hematemesis Neurologic: No visual changes, weakness, changes in mental status All other systems reviewed and are otherwise negative except as noted above.  Physical Exam Blood pressure 135/60, pulse 75, temperature 98.8 F (37.1 C), temperature source Oral, resp. rate 18, height 5\' 8"  (1.727 m), weight 157 lb 6.5 oz (71.4 kg), SpO2 94.00%.  General: Well developed, well appearing 78 year old male in no acute distress. He is sitting comfortably in bedside chair. HEENT: Normocephalic, atraumatic. EOMs intact. Sclera nonicteric. Oropharynx clear.  Neck: Supple without bruits. No JVD. Lungs: Respirations regular and unlabored, CTA bilaterally. No wheezes, rales or rhonchi. Heart: RRR. S1, S2 present. No murmurs, rub, S3 or S4. Abdomen: Soft, nondistended.  Extremities: No clubbing, cyanosis or edema. DP/PT/Radials 2+ and equal bilaterally. Psych: Normal affect. Neuro: Alert and oriented X 3. Moves all extremities spontaneously.   Labs Lab Results  Component Value Date   WBC 11.7* 08/25/2011   HGB 9.1* 08/25/2011   HCT 27.4* 08/25/2011   MCV 90.7 08/25/2011   PLT 138* 08/25/2011    Lab 08/25/11 0515  NA 139  K 4.6  CL 102  CO2 25  BUN 18  CREATININE 1.07    CALCIUM 8.6  PROT --  BILITOT --  ALKPHOS --  ALT --  AST --  GLUCOSE 108*   No components found with this basename: MAGNESIUM No components found with this basename: POCBNP:3 No results found for this basename: TSH,T4TOTAL,FREET3,T3FREE,THYROIDAB in the last 72 hours   Basename 08/25/11 0515  INR 3.26*    Radiology/Studies Dg Chest 2 View 08/22/2011  *RADIOLOGY REPORT*  Clinical Data: Shortness of breath  CHEST - 2 VIEW  Comparison:   the previous day's study  Findings: Previous CABG.  Stable mild cardiomegaly.  Improved aeration with residual patchy interstitial airspace opacities in the lung bases, left greater than right.  Question small pleural effusions.  IMPRESSION:  1.  Partial improvement  in bibasilar  infiltrate/atelectasis, left greater than right  Original Report Authenticated By: Osa Craver, M.D.   Echocardiogram Per Dr. Orvan July operative report -  Followup transesophageal echocardiogram performed after separation from bypass revealed a well-seated mitral annuloplasty ring and a mitral valve that was functioning normally and without any residual mitral regurgitation. Left ventricular function was unchanged from preoperative state.  12-lead ECG done yesterday shows normal sinus rhythm, normal intervals with PVCs; PR 208 ms, QRS 114 ms, QT/QTc 424/467 ms  Telemetry shows sinus rhythm currently; occasional PVCs; one episode of WCT yesterday, 12 beats, most consistent with monomorphic NSVT which was asymptomatic; patient has also had junctional rhythm while here with intermittent A pacing post-operatively  Assessment and Plan 1. NSVT, asymptomatic [name] [name] is post-op day #6 s/p MV repair and CABG x2. He had a brief episode of NSVT yesterday which was asymptomatic. He has no history of syncope or sustained VT. He has normal LV function. Given preserved LV function, the likelihood of SCD due to VT/VF is low; therefore, we do not feel there is any further EP work-up  necessary at this time.   Thank you for allowing [place] to participate in the care of your patient. Signed, Rick Duff, PA-C 08/25/2011, 10:08 AM  EP Attending  Patient seen and examined. I have reviewed the findings as documented above and agree with the recommendations of [employer], PA-C. He has NSVT in the setting of preserved LV function. I would continue his current medical regimen and plan to stop amiodarone in one month. Followup with Dr. Rhona Leavens in [facility] as you would normally.  Lewayne Bunting, M.D.

## 2011-08-25 NOTE — Progress Notes (Addendum)
[address]            [place] [address]          [phone]     6 Days Post-Op  Procedure(s) (LRB): MITRAL VALVE REPAIR (MVR) (N/A) CORONARY ARTERY BYPASS GRAFTING (CABG) (N/A) Subjective: Feels well  Objective  Telemetry junct, pvc's, couplets, runs of vtach  Temp:  [98.2 F (36.8 C)-98.8 F (37.1 C)] 98.8 F (37.1 C) (08/07 0426) Pulse Rate:  [67-75] 75  (08/07 0426) Resp:  [18] 18  (08/07 0426) BP: (116-135)/(60-69) 135/60 mmHg (08/07 0426) SpO2:  [93 %-95 %] 94 % (08/07 0426) Weight:  [157 lb 6.5 oz (71.4 kg)] 157 lb 6.5 oz (71.4 kg) (08/07 0426)   Intake/Output Summary (Last 24 hours) at 08/25/11 0739 Last data filed at 08/24/11 2137  Gross per 24 hour  Intake    723 ml  Output    550 ml  Net    173 ml       General appearance: alert, cooperative and no distress Heart: regular rate and rhythm, S1, S2 normal and occas extrasystole Lungs: mildly dim in bases Abdomen: benign Extremities: no edema Wound: incis healing well  Lab Results:  Basename 08/25/11 0515 08/24/11 1035  NA 139 134*  K 4.6 3.7  CL 102 100  CO2 25 26  GLUCOSE 108* 119*  BUN 18 15  CREATININE 1.07 0.89  CALCIUM 8.6 8.2*  MG -- 2.0  PHOS -- --   No results found for this basename: AST:2,ALT:2,ALKPHOS:2,BILITOT:2,PROT:2,ALBUMIN:2 in the last 72 hours No results found for this basename: LIPASE:2,AMYLASE:2 in the last 72 hours  Basename 08/25/11 0515  WBC 11.7*  NEUTROABS --  HGB 9.1*  HCT 27.4*  MCV 90.7  PLT 138*   No results found for this basename: CKTOTAL:4,CKMB:4,TROPONINI:4 in the last 72 hours No components found with this basename: POCBNP:3 No results found for this basename: DDIMER in the last 72 hours No results found for this basename: HGBA1C in the last 72 hours No results found for this basename: CHOL,HDL,LDLCALC,TRIG,CHOLHDL in the last 72 hours No results found for this basename: TSH,T4TOTAL,FREET3,T3FREE,THYROIDAB in the  last 72 hours No results found for this basename: VITAMINB12,FOLATE,FERRITIN,TIBC,IRON,RETICCTPCT in the last 72 hours  Medications: Scheduled    . acetaminophen  1,000 mg Oral Q6H  . amiodarone  200 mg Oral BID PC  . aspirin EC  81 mg Oral Daily  . bisacodyl  10 mg Oral Daily   Or  . bisacodyl  10 mg Rectal Daily  . carvedilol  3.125 mg Oral BID WC  . docusate sodium  200 mg Oral Daily  . furosemide  40 mg Oral Daily  . insulin aspart  0-24 Units Subcutaneous TID AC & HS  . niacin  1,000 mg Oral QHS  . pantoprazole  40 mg Oral Q1200  . potassium chloride  20 mEq Oral Daily  . simvastatin  20 mg Oral q1800  . sodium chloride  3 mL Intravenous Q12H  . Warfarin - Physician Dosing Inpatient   Does not apply q1800  . DISCONTD: warfarin  1 mg Oral q1800     Radiology/Studies:  No results found.  INR:3.26 Will add last result for INR, ABG once components are confirmed Will add last 4 CBG results once components are confirmed  Assessment/Plan: S/P Procedure(s) (LRB): MITRAL VALVE REPAIR (MVR) (N/A) CORONARY ARTERY BYPASS GRAFTING (CABG) (N/A)  1.INR still increased despite no coumadin yest, cont to hold, check LFT's on amio 2 rhythm not stable, QTc ok , prob cant increase beta blocker at this point with juctional and low relative rate. May need cardiol opinion.   LOS: 6 days    Rayfield Beem E 8/7/20137:39 AM    Addendum- have consulted cardiology

## 2011-08-26 LAB — GLUCOSE, CAPILLARY
Glucose-Capillary: 134 mg/dL — ABNORMAL HIGH (ref 70–99)
Glucose-Capillary: 144 mg/dL — ABNORMAL HIGH (ref 70–99)

## 2011-08-26 MED ORDER — COUMADIN BOOK
Freq: Once | Status: AC
  Administered 2011-08-26: 20:00:00
  Filled 2011-08-26: qty 1

## 2011-08-26 NOTE — Progress Notes (Signed)
CARDIAC REHAB PHASE I  PRE:  Rate/Rhythm: 80 SR    BP: sitting 100/10    SaO2: 95 RA  MODE:  Ambulation: 850 ft   POST:  Rate/Rhythm: 91 SR    BP: sitting 90/40     SaO2: 97 RA  Pt c/o lightheadedness today. Sts it did not change with walking. BP lower than his usual. O/w did well, long distance. Will f/u. [id number]  Harriet Masson CES, ACSM

## 2011-08-26 NOTE — Progress Notes (Signed)
Pt had a rhythmn change. Pt is in afib/aflutter rate controlled 70-80's, asymptomatic. Called md on call(Hendrickson). Made him aware, no new orders given. Monitoring will continue.

## 2011-08-26 NOTE — Progress Notes (Addendum)
[address]            [place] [address]          [phone]     7 Days Post-Op  Procedure(s) (LRB): MITRAL VALVE REPAIR (MVR) (N/A) CORONARY ARTERY BYPASS GRAFTING (CABG) (N/A) Subjective: Feels well , now in afib with cvr  Objective  Telemetry as above, with pvc's  Temp:  [98.5 F (36.9 C)-98.9 F (37.2 C)] 98.5 F (36.9 C) (08/08 0540) Pulse Rate:  [73-86] 86  (08/08 0540) Resp:  [18-20] 20  (08/08 0540) BP: (102-112)/(63-67) 103/66 mmHg (08/08 0540) SpO2:  [93 %-95 %] 93 % (08/08 0540) Weight:  [154 lb 15.7 oz (70.3 kg)] 154 lb 15.7 oz (70.3 kg) (08/08 0540)   Intake/Output Summary (Last 24 hours) at 08/26/11 0739 Last data filed at 08/25/11 1300  Gross per 24 hour  Intake    480 ml  Output      0 ml  Net    480 ml       General appearance: alert, cooperative and no distress Heart: irregularly irregular rhythm Lungs: clear to auscultation bilaterally Abdomen: benign Extremities: no edema Wound: incisions healing well  Lab Results:  Basename 08/25/11 0515 08/24/11 1035  NA 139 134*  K 4.6 3.7  CL 102 100  CO2 25 26  GLUCOSE 108* 119*  BUN 18 15  CREATININE 1.07 0.89  CALCIUM 8.6 8.2*  MG -- 2.0  PHOS -- --    Basename 08/25/11 1043  AST 49*  ALT 56*  ALKPHOS 80  BILITOT 0.7  PROT 6.6  ALBUMIN 3.0*   No results found for this basename: LIPASE:2,AMYLASE:2 in the last 72 hours  Basename 08/25/11 0515  WBC 11.7*  NEUTROABS --  HGB 9.1*  HCT 27.4*  MCV 90.7  PLT 138*   No results found for this basename: CKTOTAL:4,CKMB:4,TROPONINI:4 in the last 72 hours No components found with this basename: POCBNP:3 No results found for this basename: DDIMER in the last 72 hours No results found for this basename: HGBA1C in the last 72 hours No results found for this basename: CHOL,HDL,LDLCALC,TRIG,CHOLHDL in the last 72 hours No results found for this basename: TSH,T4TOTAL,FREET3,T3FREE,THYROIDAB in the last 72  hours No results found for this basename: VITAMINB12,FOLATE,FERRITIN,TIBC,IRON,RETICCTPCT in the last 72 hours  Medications: Scheduled    . amiodarone  200 mg Oral BID PC  . aspirin EC  81 mg Oral Daily  . bisacodyl  10 mg Oral Daily   Or  . bisacodyl  10 mg Rectal Daily  . carvedilol  3.125 mg Oral BID WC  . docusate sodium  200 mg Oral Daily  . furosemide  40 mg Oral Daily  . insulin aspart  0-24 Units Subcutaneous TID AC & HS  . niacin  1,000 mg Oral QHS  . pantoprazole  40 mg Oral Q1200  . potassium chloride  20 mEq Oral Daily  . simvastatin  20 mg Oral q1800  . sodium chloride  3 mL Intravenous Q12H  . Warfarin - Physician Dosing Inpatient   Does not apply q1800     Radiology/Studies:  No results found.  INR:3.84 Will add last result for INR, ABG once components are confirmed Will add last 4 CBG results once components are confirmed  Assessment/Plan: S/P Procedure(s) (LRB): MITRAL VALVE REPAIR (MVR) (N/A) CORONARY ARTERY BYPASS GRAFTING (CABG) (N/A)  1. Clinically doing well, now  in afib. 2 INR is rising despite no coumadin in 2 days, and only 1mg  the day before. LFT's mildly elevated, may have to adjust amiodarone or stop 3 vtach- per EP not a clinical concern at this time with nl LV fxn/nonsustained and asymptomatic low risk for sudden cardiac death 4 epw's will need to be d/c'd when inr lower   LOS: 7 days    GOLD,WAYNE E 8/8/20137:39 AM     Chart reviewed, patient examined, agree with above. INR is 3.8 with minimal coumadin and it has risen with no coumadin in 2 days. If it rises further will give him a small dose of vit K since it may indicated vitamin deficiency.  His LFT's are minimally elevated so I would continue amio for now.

## 2011-08-27 LAB — GLUCOSE, CAPILLARY
Glucose-Capillary: 130 mg/dL — ABNORMAL HIGH (ref 70–99)
Glucose-Capillary: 124 mg/dL — ABNORMAL HIGH (ref 70–99)
Glucose-Capillary: 138 mg/dL — ABNORMAL HIGH (ref 70–99)

## 2011-08-27 NOTE — Progress Notes (Addendum)
8 Days Post-Op Procedure(s) (LRB): MITRAL VALVE REPAIR (MVR) (N/A) CORONARY ARTERY BYPASS GRAFTING (CABG) (N/A) Subjective:  [name] [name] has no complaints this morning. He remains in rate controlled afib  Objective: Vital signs in last 24 hours: Temp:  [98.1 F (36.7 C)-98.4 F (36.9 C)] 98.1 F (36.7 C) (08/09 0503) Pulse Rate:  [71-87] 86  (08/09 0503) Cardiac Rhythm:  [-] Atrial fibrillation (08/09 0821) Resp:  [16-18] 18  (08/09 0503) BP: (100-117)/(65-68) 117/65 mmHg (08/09 0503) SpO2:  [93 %-95 %] 95 % (08/09 0503)  Intake/Output from previous day: 08/08 0701 - 08/09 0700 In: 480 [P.O.:480] Out: 550 [Urine:550] Intake/Output this shift: Total I/O In: 240 [P.O.:240] Out: 500 [Urine:500]  General appearance: alert, cooperative and no distress Heart: irregularly irregular rhythm Lungs: clear to auscultation bilaterally Abdomen: soft, non-tender; bowel sounds normal; no masses,  no organomegaly Extremities: edema trace Wound: clean and dry  Lab Results:  [facility] 08/25/11 0515  WBC 11.7*  HGB 9.1*  HCT 27.4*  PLT 138*   BMET:  Basename 08/25/11 0515 08/24/11 1035  NA 139 134*  K 4.6 3.7  CL 102 100  CO2 25 26  GLUCOSE 108* 119*  BUN 18 15  CREATININE 1.07 0.89  CALCIUM 8.6 8.2*    PT/INR:  Basename 08/27/11 0545  LABPROT 34.1*  INR 3.31*   ABG    Component Value Date/Time   PHART 7.312* 08/19/2011 1923   HCO3 22.8 08/19/2011 1923   TCO2 24 08/20/2011 1612   ACIDBASEDEF 3.0* 08/19/2011 1923   O2SAT 98.0 08/19/2011 1923   CBG (last 3)   Basename 08/27/11 0653 08/26/11 2147 08/26/11 1635  GLUCAP 130* 114* 144*    Assessment/Plan: S/P Procedure(s) (LRB): MITRAL VALVE REPAIR (MVR) (N/A) CORONARY ARTERY BYPASS GRAFTING (CABG) (N/A)  1. CV- Afib, rate controlled on Coreg and Amiodarone 2. INR remains elevated at 3.31 which has decreased from yesterday, will continue to hold coumadin, if remains elevated will order Vit K in the morning 3. DM- Pre op A1c  mildly elevated at 6.1, will continue SSIP and recommend follow up as outpatient 4. Dispo- patient remains in A. Fib on Amiodarone, INR remains elevated at 1 dose of coumadin will follow and give vitamin K if necessary   LOS: 8 days    [name], [name] 08/27/2011   INR slowly decreasing. Doubt he'll need vitamin K

## 2011-08-28 LAB — GLUCOSE, CAPILLARY: Glucose-Capillary: 132 mg/dL — ABNORMAL HIGH (ref 70–99)

## 2011-08-28 MED ORDER — DIPHENHYDRAMINE HCL 50 MG/ML IJ SOLN
12.5000 mg | Freq: Four times a day (QID) | INTRAMUSCULAR | Status: DC | PRN
  Administered 2011-08-28 – 2011-08-29 (×3): 12.5 mg via INTRAVENOUS
  Administered 2011-08-30: 50 mg via INTRAVENOUS
  Filled 2011-08-28 (×4): qty 1

## 2011-08-28 NOTE — Progress Notes (Signed)
Pt reports itching has improved; will cont. To monitor.

## 2011-08-28 NOTE — Progress Notes (Signed)
Pt c/o itching; pt given 12.5mg  IV Benadryl; will cont. To monitor.

## 2011-08-28 NOTE — Progress Notes (Addendum)
9 Days Post-Op Procedure(s) (LRB): MITRAL VALVE REPAIR (MVR) (N/A) CORONARY ARTERY BYPASS GRAFTING (CABG) (N/A)  Subjective:  Patient complains of rash this morning.  He states it has been present for 2 days, but got much worse after taking Niacin last night.  He states he takes niacin at home and requests using his home medication instead of the hospitals  Objective: Vital signs in last 24 hours: Temp:  [97.7 F (36.5 C)-98.5 F (36.9 C)] 98.5 F (36.9 C) (08/10 0407) Pulse Rate:  [68-79] 79  (08/10 0407) Cardiac Rhythm:  [-] Other (Comment) (08/10 0806) Resp:  [18-19] 19  (08/10 0407) BP: (92-117)/(52-70) 117/70 mmHg (08/10 0407) SpO2:  [93 %-97 %] 93 % (08/10 0407) Weight:  [155 lb 12.8 oz (70.67 kg)] 155 lb 12.8 oz (70.67 kg) (08/10 0407)  Intake/Output from previous day: 08/09 0701 - 08/10 0700 In: 1560 [P.O.:1560] Out: 1401 [Urine:1400; Stool:1]  General appearance: alert, cooperative and no distress Heart: irregularly irregular rate and rhythm Lungs: clear to auscultation bilaterally Abdomen: soft, non-tender; bowel sounds normal; no masses,  no organomegaly Extremities: edema trace Wound: clean and dry Skin: rash along torso and bilateral LE, appears pruritic  Lab Results: No results found for this basename: WBC:2,HGB:2,HCT:2,PLT:2 in the last 72 hours BMET: No results found for this basename: NA:2,K:2,CL:2,CO2:2,GLUCOSE:2,BUN:2,CREATININE:2,CALCIUM:2 in the last 72 hours  PT/INR:  Basename 08/28/11 0455  LABPROT 30.2*  INR 2.83*   ABG    Component Value Date/Time   PHART 7.312* 08/19/2011 1923   HCO3 22.8 08/19/2011 1923   TCO2 24 08/20/2011 1612   ACIDBASEDEF 3.0* 08/19/2011 1923   O2SAT 98.0 08/19/2011 1923   CBG (last 3)   Basename 08/28/11 0616 08/27/11 2043 08/27/11 1645  GLUCAP 132* 138* 138*    Assessment/Plan: S/P Procedure(s) (LRB): MITRAL VALVE REPAIR (MVR) (N/A) CORONARY ARTERY BYPASS GRAFTING (CABG) (N/A)  1. CV- rate controlled A. Fib on Coreg  and Amiodarone 2. INR slowly trending down- 2.83 this morning will continue to hold coumadin 3. Rash- appears to be drug reaction, per patient started bothering him after Niacin last night, will give IV Benadryl for itching  4.DM- preop A1c 6.1, continue SSIP and diet modification 5. Dispo- patient with apparent drug reaction, patient feels niacin is source, but wishes to take home niacin stating has never caused him a problem.  All medications except coumadin were home medications prior to surgery.  Patient also received Vancomycin post operative.  Timing seems to be delayed for Vancomycin last dose on 8/1, coumadin last dose was 8/5, will discuss further possibilities with Dr. Dorris Fetch.  INR trending down will not require Vit K   LOS: 9 days    BARRETT, ERIN 08/28/2011  Patient seen and examined. He thinks it is niacin causing his rash/ itching- will d/c

## 2011-08-28 NOTE — Progress Notes (Signed)
Pt not wanting to ambulate at this time; will try at a later time.

## 2011-08-28 NOTE — Progress Notes (Signed)
Pt reports feeling relief from IV Benadryl; will cont. To monitor.

## 2011-08-28 NOTE — Progress Notes (Signed)
Pt ambulated 350 feet in hallway with RN; pt slightly dizzy when first standing up from chair; pt assisted back to chair for a moment, then proceeded with walk; pt to BR once back in room; will cont. To monitor.

## 2011-08-28 NOTE — Progress Notes (Signed)
CARDIAC REHAB PHASE I   PRE:  Rate/Rhythm: 80 A fib  BP:  Supine:   Sitting: 100/50  Standing:    SaO2: 97 RA  MODE:  Ambulation: 1480 ft   POST:  Rate/Rhythem: 100  BP:  Supine:   Sitting: 128/60  Standing:    SaO2: 100 RA 1045-1120 Assisted X 1 to ambulate. Gait a little wobbly, he had received IV Benadryl earlier. Pt able to walk without c/o other than feeling wobbly. VS stable. Pt back to recliner after walk with call light in reach.  Beatrix Fetters

## 2011-08-28 NOTE — Progress Notes (Signed)
Pt with rash over entire torso and down thighs; PA made aware at this time; will cont. To monitor.

## 2011-08-28 NOTE — Progress Notes (Signed)
Pt given 12.5mg  IV Benadryl at this time for itching; will cont. To monitor.

## 2011-08-29 LAB — GLUCOSE, CAPILLARY
Glucose-Capillary: 108 mg/dL — ABNORMAL HIGH (ref 70–99)
Glucose-Capillary: 114 mg/dL — ABNORMAL HIGH (ref 70–99)
Glucose-Capillary: 113 mg/dL — ABNORMAL HIGH (ref 70–99)

## 2011-08-29 LAB — PROTIME-INR: INR: 2.47 — ABNORMAL HIGH (ref 0.00–1.49)

## 2011-08-29 NOTE — Progress Notes (Signed)
Pt encouraged to get up and ambulate after eating supper; pt up in chair eating at this time; wife in room; pt agrees to ambulate soon; will cont. To monitor.

## 2011-08-29 NOTE — Progress Notes (Signed)
Pt c/o itching; pt given 12.5mg  IV Benadryl at this time; will cont. To monitor.

## 2011-08-29 NOTE — Progress Notes (Signed)
Pt up ambulating independently in hallway; will cont. To monitor.

## 2011-08-29 NOTE — Progress Notes (Signed)
Pt reports benadryl has helped itching; will cont. To monitor.

## 2011-08-29 NOTE — Progress Notes (Addendum)
10 Days Post-Op Procedure(s) (LRB): MITRAL VALVE REPAIR (MVR) (N/A) CORONARY ARTERY BYPASS GRAFTING (CABG) (N/A)  Subjective: [name] [name] is doing better this morning.  He states his rash and itching have improved with use of Benadryl.  Objective: Vital signs in last 24 hours: Temp:  [97.6 F (36.4 C)-98.5 F (36.9 C)] 98.5 F (36.9 C) (08/11 0423) Pulse Rate:  [68-83] 74  (08/11 0423) Cardiac Rhythm:  [-] Atrial fibrillation (08/11 0806) Resp:  [17-20] 20  (08/11 0423) BP: (97-110)/(63-66) 107/65 mmHg (08/11 0423) SpO2:  [96 %-97 %] 96 % (08/11 0423) Weight:  [155 lb 3.2 oz (70.398 kg)] 155 lb 3.2 oz (70.398 kg) (08/11 0423)  Intake/Output from previous day: 08/10 0701 - 08/11 0700 In: 60 [P.O.:60] Out: 1450 [Urine:1450] Intake/Output this shift: Total I/O In: 120 [P.O.:120] Out: 200 [Urine:200]  General appearance: alert, cooperative and no distress Heart: irregularly irregular rhythm Lungs: clear to auscultation bilaterally Abdomen: soft, non-tender; bowel sounds normal; no masses,  no organomegaly Extremities: edema trace Wound: clean and dry Skin: improvement of rash  Lab Results: No results found for this basename: WBC:2,HGB:2,HCT:2,PLT:2 in the last 72 hours BMET: No results found for this basename: NA:2,K:2,CL:2,CO2:2,GLUCOSE:2,BUN:2,CREATININE:2,CALCIUM:2 in the last 72 hours  PT/INR:  Basename 08/29/11 0620  LABPROT 27.2*  INR 2.47*   ABG    Component Value Date/Time   PHART 7.312* 08/19/2011 1923   HCO3 22.8 08/19/2011 1923   TCO2 24 08/20/2011 1612   ACIDBASEDEF 3.0* 08/19/2011 1923   O2SAT 98.0 08/19/2011 1923   CBG (last 3)   Basename 08/29/11 0614 08/28/11 2053 08/28/11 1554  GLUCAP 108* 125* 143*    Assessment/Plan: S/P Procedure(s) (LRB): MITRAL VALVE REPAIR (MVR) (N/A) CORONARY ARTERY BYPASS GRAFTING (CABG) (N/A)  1. CV- rate controlled A.Fib on Coreg and Amiodarone 2. INR continues to trend down 2.47 this morning, continue to hold coumadin 3.  Rash- continue Benadryl prn, Niacin discontinued 4. DM- will need outpatient follow up, A1c 6.1 continue diet modification, meal coverage 5. Dispo- patient's INR trending down, can hopefully d/c pacing wires tomorrow.  Will need to address need for anticoagulation regimen for discharge, patient extremely sensitive after 1mg  coumadin.  Once issues resolved patient stable for discharge hopefully early this week.   LOS: 10 days    [name] [name] 08/29/2011   Patient seen and examined. Reviewed notes. It appears he got 2.5 mg coumadin on POD 1, 2 and 3, then 1 mg on POD 4(8/5) before INR overshot. Coumadin has been held since the dose on 8/5 Rash improving- ? Source, may be the hospital's niacin formulation, but far from certain that is the cause.

## 2011-08-30 LAB — PROTIME-INR
INR: 2.7 — ABNORMAL HIGH (ref 0.00–1.49)
Prothrombin Time: 29.1 seconds — ABNORMAL HIGH (ref 11.6–15.2)

## 2011-08-30 MED ORDER — WARFARIN SODIUM 1 MG PO TABS: 1.0000 mg | ORAL_TABLET | Freq: Every day | ORAL | Status: DC

## 2011-08-30 MED ORDER — WARFARIN SODIUM 1 MG PO TABS
1.0000 mg | ORAL_TABLET | Freq: Every day | ORAL | Status: DC
  Filled 2011-08-30: qty 1

## 2011-08-30 NOTE — Progress Notes (Signed)
[id number] Education completed with pt who voiced understanding. Pt states he has watched d/c video. Notifying [facility] of pt's interest in CRP 2. Jeanni Allshouse DunlapRN

## 2011-08-30 NOTE — Progress Notes (Signed)
Pt up ambulating in hallway independently at this time awaiting wife to arrive for d/c home; will cont. To monitor.

## 2011-08-30 NOTE — Progress Notes (Signed)
   CARDIOTHORACIC SURGERY PROGRESS NOTE  11 Days Post-Op  S/P Procedure(s) (LRB): MITRAL VALVE REPAIR (MVR) (N/A) CORONARY ARTERY BYPASS GRAFTING (CABG) (N/A)  Subjective: Feels well.  Breathing much better than prior to surgery.  No pain.  Eating well.  Objective: Vital signs in last 24 hours: Temp:  [97.1 F (36.2 C)-98.5 F (36.9 C)] 98.5 F (36.9 C) (08/12 0434) Pulse Rate:  [70-81] 81  (08/12 0434) Cardiac Rhythm:  [-] Atrial fibrillation (08/12 0811) Resp:  [18-19] 19  (08/12 0434) BP: (97-114)/(56-64) 114/64 mmHg (08/12 0434) SpO2:  [94 %-97 %] 94 % (08/12 0434) Weight:  [69.99 kg (154 lb 4.8 oz)] 69.99 kg (154 lb 4.8 oz) (08/12 0434)  Physical Exam:  Rhythm:   afib  Breath sounds: clear  Heart sounds:  irreg - no murmur  Incisions:  Clean and dry  Abdomen:  soft  Extremities:  warm   Intake/Output from previous day: 08/11 0701 - 08/12 0700 In: 540 [P.O.:540] Out: 650 [Urine:650] Intake/Output this shift:    Lab Results: No results found for this basename: WBC:2,HGB:2,HCT:2,PLT:2 in the last 72 hours BMET: No results found for this basename: NA:2,K:2,CL:2,CO2:2,GLUCOSE:2,BUN:2,CREATININE:2,CALCIUM:2 in the last 72 hours  CBG (last 3)   Basename 08/29/11 2109 08/29/11 1628 08/29/11 1119  GLUCAP 141* 113* 114*   PT/INR:   Basename 08/30/11 0500  LABPROT 29.1*  INR 2.70*    CXR:  N/A  Assessment/Plan: S/P Procedure(s) (LRB): MITRAL VALVE REPAIR (MVR) (N/A) CORONARY ARTERY BYPASS GRAFTING (CABG) (N/A)  Doing well Will d/c home today on coumadin 1 mg daily Check INR at Dr Johnna Acosta office [facility]) on Thursday  Malka Bocek H 08/30/2011 8:23 AM

## 2011-08-30 NOTE — Progress Notes (Signed)
EPW d/c per MD order; no oozing or bleeding noted; pt tolerated well; CT sututres removed as well; steri strips applied; no oozing or drainage noted; bedrest until 0950; pt informed; will cont. To monitor.

## 2011-08-30 NOTE — Discharge Summary (Signed)
I agree with the above discharge summary and plan for follow-up.  OWEN,CLARENCE H  

## 2011-08-31 LAB — GLUCOSE, CAPILLARY

## 2011-09-01 ENCOUNTER — Telehealth: Payer: Self-pay

## 2011-09-01 NOTE — Telephone Encounter (Signed)
Results sent to Dr Davina Poke for management.

## 2011-09-02 ENCOUNTER — Telehealth: Payer: Self-pay | Admitting: Thoracic Surgery (Cardiothoracic Vascular Surgery)

## 2011-09-02 NOTE — Telephone Encounter (Signed)
Called patient to make certain he had gotten the instructions to stop taking coumadin since his INR was so high.  He had received the instructions and was told to eat a lot of green leafy vegetables.  I instructed him to also discontinue aspirin until his INR decreases towards normal, and I favor stopping coumadin altogether as it may be more dangerous to try to manage him on coumadin than it would be to stop it altogether.  [name] [name] H 09/02/2011 4:00 PM

## 2011-09-08 ENCOUNTER — Other Ambulatory Visit: Payer: Self-pay | Admitting: Thoracic Surgery (Cardiothoracic Vascular Surgery)

## 2011-09-08 DIAGNOSIS — I059 Rheumatic mitral valve disease, unspecified: Secondary | ICD-10-CM

## 2011-09-13 ENCOUNTER — Ambulatory Visit
Admission: RE | Admit: 2011-09-13 | Discharge: 2011-09-13 | Disposition: A | Discharge status: Home / Self Care | Payer: Medicare Other | Source: Ambulatory Visit | Attending: Thoracic Surgery (Cardiothoracic Vascular Surgery) | Admitting: Thoracic Surgery (Cardiothoracic Vascular Surgery)

## 2011-09-13 ENCOUNTER — Ambulatory Visit (INDEPENDENT_AMBULATORY_CARE_PROVIDER_SITE_OTHER): Payer: Self-pay | Admitting: Physician Assistant

## 2011-09-13 VITALS — BP 115/68 | HR 59 | Resp 16 | Ht 68.0 in | Wt 160.0 lb

## 2011-09-13 DIAGNOSIS — I2510 Atherosclerotic heart disease of native coronary artery without angina pectoris: Secondary | ICD-10-CM

## 2011-09-13 DIAGNOSIS — Z09 Encounter for follow-up examination after completed treatment for conditions other than malignant neoplasm: Secondary | ICD-10-CM

## 2011-09-13 DIAGNOSIS — I4891 Unspecified atrial fibrillation: Secondary | ICD-10-CM

## 2011-09-13 DIAGNOSIS — I059 Rheumatic mitral valve disease, unspecified: Secondary | ICD-10-CM

## 2011-09-13 NOTE — Progress Notes (Signed)
  HPI: Patient returns for routine postoperative follow-up having undergone MV Repair and CABG x2 on 08/19/2011. The patient's early postoperative recovery while in the hospital was notable for NSVT and Atrial Fibrillation. He was very sensitive to coumadin and after administration of 1mg  of Coumadin had a supratherapeutic INR. Since hospital discharge the patient reports that he was taken off his Coumadin due to INR of 14.4 and it was felt that it would be unsafe for the patient to continue use.  Otherwise the patient is doing very well.  He is ambulating without difficulty.  He does have some minor shortness of breath while laying flat.  His appetite is within normal limits.   Current Outpatient Prescriptions  Medication Sig Dispense Refill  . amiodarone (PACERONE) 200 MG tablet Take 1 tablet (200 mg total) by mouth 2 (two) times daily.  60 tablet  1  . aspirin EC 81 MG tablet Take 81 mg by mouth daily.      . carvedilol (COREG) 3.125 MG tablet Take 3.125 mg by mouth 2 (two) times daily with a meal.      . furosemide (LASIX) 40 MG tablet Take 40 mg by mouth every morning.       . potassium chloride SA (K-DUR,KLOR-CON) 20 MEQ tablet Take 20 mEq by mouth 2 (two) times daily.      . simvastatin (ZOCOR) 20 MG tablet Take 1 tablet (20 mg total) by mouth at bedtime.  30 tablet  0    Physical Exam:  BP 115/68  Pulse 59  Resp 16  Ht 5\' 8"  (1.727 m)  Wt 160 lb (72.576 kg)  BMI 24.33 kg/m2  SpO2 96%  Gen: no apparent distress Lungs: Diminished on left base Heart: RRR, sternum stable Abd: soft non-tender, non-distended Skin: incisions healing well Neuro: grossly intact  Diagnostic Tests:  CXR: minor left pleural effusion, somewhat worse than previous film  Impression:  [name] [name] is S/P MV Repair and CABG x2.  Post operatively he has been having episodes of atrial fibrillation.  He appeared to be in NSR per palpation of wrist pulse and auscultation on exam.  I wanted to obtain and EKG, but  patient stated he just had one and was going to follow up with Dr. Rhona Leavens tomorrow.  He is progressing well.  His last INR was 5.4 and he is no longer taking Coumadin.  Plan:  [name] [name] is doing well.  We will bring the patient back to clinic in 4 weeks to follow up with Dr. Cornelius Moras.  The patient is progressing nicely and was instructed he could continue to increase his activity level as tolerated but should observe sternal precautions for the remaining couple of weeks.  He appears to be in NSR today, and will follow up with Dr. Rhona Leavens tomorrow.  Patient was instructed to contact our office sooner should he develop worsening shortness of breath.

## 2011-09-25 ENCOUNTER — Other Ambulatory Visit: Payer: Self-pay | Admitting: Physician Assistant

## 2011-10-11 ENCOUNTER — Encounter: Payer: Self-pay | Admitting: Thoracic Surgery (Cardiothoracic Vascular Surgery)

## 2011-10-11 ENCOUNTER — Ambulatory Visit (INDEPENDENT_AMBULATORY_CARE_PROVIDER_SITE_OTHER): Payer: Self-pay | Admitting: Thoracic Surgery (Cardiothoracic Vascular Surgery)

## 2011-10-11 VITALS — BP 112/66 | HR 84 | Resp 18 | Ht 68.0 in | Wt 156.0 lb

## 2011-10-11 DIAGNOSIS — I2510 Atherosclerotic heart disease of native coronary artery without angina pectoris: Secondary | ICD-10-CM

## 2011-10-11 DIAGNOSIS — Z951 Presence of aortocoronary bypass graft: Secondary | ICD-10-CM

## 2011-10-11 DIAGNOSIS — Z9889 Other specified postprocedural states: Secondary | ICD-10-CM | POA: Insufficient documentation

## 2011-10-11 NOTE — Progress Notes (Signed)
                   [address]            [place] [address]          [phone]     CARDIOTHORACIC SURGERY OFFICE NOTE  Referring Provider is Theodoro Grist, MD PCP is Arletta Bale, MD   HPI:  Patient returns for followup status post mitral valve repair and coronary artery bypass grafting x2 on 08/19/2011.  He was last seen here in the office 09/13/2011. He underwent DC cardioversion and has apparently been maintaining sinus rhythm ever since.  He is currently participating in the cardiac rehabilitation program in [facility] and overall doing exceptionally well. 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 He has had remarkably little pain since his surgery and he reports no shortness of breath whatsoever.  His exercise tolerance is quite good and he states that he feels like a new man.   Current Outpatient Prescriptions  Medication Sig Dispense Refill  . amiodarone (PACERONE) 200 MG tablet 200 mg daily.      Marland Kitchen aspirin EC 81 MG tablet Take 81 mg by mouth daily.      . carvedilol (COREG) 3.125 MG tablet Take 3.125 mg by mouth 2 (two) times daily with a meal.      . furosemide (LASIX) 40 MG tablet Take 40 mg by mouth every morning.       . potassium chloride SA (K-DUR,KLOR-CON) 20 MEQ tablet Take 20 mEq by mouth 2 (two) times daily.      . simvastatin (ZOCOR) 20 MG tablet Take 1 tablet (20 mg total) by mouth at bedtime.  30 tablet  0  . DISCONTD: amiodarone (PACERONE) 200 MG tablet Take 1 tablet (200 mg total) by mouth 2 (two) times daily.  60 tablet  1      Physical Exam:   BP 112/66  Pulse 84  Resp 18  Ht 5\' 8"  (1.727 m)  Wt 156 lb (70.761 kg)  BMI 23.72 kg/m2  SpO2 97%  General:  Well-appearing  Chest:   Clear to auscultation with symmetrical breath sounds  CV:   Regular rate and rhythm without murmur  Incisions:  Clean and dry and healing nicely  Abdomen:  Soft and nontender  Extremities:  Warm and  well-perfused  Diagnostic Tests:  n/a   Impression:  Patient is doing very well following mitral valve repair and coronary artery bypass grafting.   Plan:  I've encouraged patient to continue to gradually increase his physical activity as tolerated with his only limitation at this point remaining that he refrain from heavy lifting or strenuous use of his arms or shoulders for another 6 weeks or so. I think it is reasonable for him to resume driving an automobile. We have not made any changes in his current medications. All of his questions been answered. We'll plan to see him back in 6 months.  I would recommend that he have a followup echocardiogram performed at some point within the next 3 to 6 months.   Salvatore Decent. Cornelius Moras, MD 10/11/2011 1:59 PM

## 2011-10-11 NOTE — Patient Instructions (Signed)
The patient has been instructed that they may return driving an automobile as long as they are no longer requiring oral narcotic pain relievers during the daytime.  They have been advised to start driving short distances during the daylight and gradually increase from there as they feel comfortable. The patient has been reminded to continue to avoid any heavy lifting or strenuous use of arms or shoulders for at least a total of three months from the time of surgery. I recommended the patient have routine followup echocardiogram performed within the next 3-6 months

## 2012-04-10 ENCOUNTER — Ambulatory Visit: Payer: Medicare Other | Admitting: Thoracic Surgery (Cardiothoracic Vascular Surgery)

## 2012-04-17 ENCOUNTER — Ambulatory Visit: Payer: Medicare Other | Admitting: Thoracic Surgery (Cardiothoracic Vascular Surgery)

## 2012-05-08 ENCOUNTER — Ambulatory Visit (INDEPENDENT_AMBULATORY_CARE_PROVIDER_SITE_OTHER): Payer: Medicare Other | Admitting: Thoracic Surgery (Cardiothoracic Vascular Surgery)

## 2012-05-08 ENCOUNTER — Encounter: Payer: Self-pay | Admitting: Thoracic Surgery (Cardiothoracic Vascular Surgery)

## 2012-05-08 VITALS — BP 118/64 | HR 62 | Resp 16 | Ht 68.0 in | Wt 160.0 lb

## 2012-05-08 DIAGNOSIS — I2510 Atherosclerotic heart disease of native coronary artery without angina pectoris: Secondary | ICD-10-CM

## 2012-05-08 DIAGNOSIS — Z951 Presence of aortocoronary bypass graft: Secondary | ICD-10-CM

## 2012-05-08 DIAGNOSIS — Z9889 Other specified postprocedural states: Secondary | ICD-10-CM

## 2012-05-08 DIAGNOSIS — I340 Nonrheumatic mitral (valve) insufficiency: Secondary | ICD-10-CM

## 2012-05-08 DIAGNOSIS — I059 Rheumatic mitral valve disease, unspecified: Secondary | ICD-10-CM

## 2012-05-08 NOTE — Progress Notes (Signed)
                   [address]            [place] [address]          [phone]     CARDIOTHORACIC SURGERY OFFICE NOTE  Referring Provider is Theodoro Grist, MD PCP is Arletta Bale, MD   HPI:  Patient returns for followup status post mitral valve repair and coronary artery bypass grafting x2 on 08/19/2011. He was last seen here in the office 10/11/2011.  Since then he has continued to do well from a cardiovascular standpoint. He continues to followup with Dr. Rhona Leavens who apparently saw him most recently in January. He is maintaining sinus rhythm on low-dose amiodarone.  He reports that clinically he is doing very well. He has no shortness of breath. His exercise tolerance is good. He has not had any chest pain or chest tightness. He has not had any tachypalpitations.    Current Outpatient Prescriptions  Medication Sig Dispense Refill  . amiodarone (PACERONE) 200 MG tablet 100 mg daily.       Marland Kitchen aspirin EC 81 MG tablet Take 81 mg by mouth daily.      . carvedilol (COREG) 3.125 MG tablet Take 3.125 mg by mouth 2 (two) times daily with a meal.      . furosemide (LASIX) 40 MG tablet Take 40 mg by mouth every morning.       . potassium chloride SA (K-DUR,KLOR-CON) 20 MEQ tablet Take 20 mEq by mouth 2 (two) times daily.      . simvastatin (ZOCOR) 20 MG tablet Take 1 tablet (20 mg total) by mouth at bedtime.  30 tablet  0   No current facility-administered medications for this visit.      Physical Exam:   BP 118/64  Pulse 62  Resp 16  Ht 5\' 8"  (1.727 m)  Wt 160 lb (72.576 kg)  BMI 24.33 kg/m2  SpO2 96%  General:  Well-appearing  Chest:   Clear to auscultation  CV:   Regular rate and rhythm without murmur  Incisions:  Completely healed  Abdomen:  Soft nontender  Extremities:  Warm and well-perfused, no edema  Diagnostic Tests:  n/a   Impression:  Patient is doing very well 6 months status post mitral valve repair and coronary artery bypass grafting.  The  patient is not sure that he has had a followup echocardiogram performed since surgery.  Plan:  In the future the patient will call and return to see [place] as needed.  We will contact Dr. Johnna Acosta office to request a copy of report from his most recent echocardiogram.   Salvatore Decent. Cornelius Moras, MD 05/08/2012 4:07 PM

## 2015-03-25 DIAGNOSIS — R0609 Other forms of dyspnea: Secondary | ICD-10-CM | POA: Diagnosis not present

## 2015-03-25 DIAGNOSIS — I25810 Atherosclerosis of coronary artery bypass graft(s) without angina pectoris: Secondary | ICD-10-CM | POA: Diagnosis not present

## 2015-03-25 DIAGNOSIS — R0602 Shortness of breath: Secondary | ICD-10-CM | POA: Diagnosis not present

## 2015-03-25 DIAGNOSIS — Z6827 Body mass index (BMI) 27.0-27.9, adult: Secondary | ICD-10-CM | POA: Diagnosis not present

## 2015-03-25 DIAGNOSIS — I480 Paroxysmal atrial fibrillation: Secondary | ICD-10-CM | POA: Diagnosis not present

## 2015-03-25 DIAGNOSIS — Z951 Presence of aortocoronary bypass graft: Secondary | ICD-10-CM | POA: Diagnosis not present

## 2015-03-25 DIAGNOSIS — I341 Nonrheumatic mitral (valve) prolapse: Secondary | ICD-10-CM | POA: Diagnosis not present

## 2015-03-25 DIAGNOSIS — E785 Hyperlipidemia, unspecified: Secondary | ICD-10-CM | POA: Diagnosis not present

## 2015-04-09 DIAGNOSIS — E784 Other hyperlipidemia: Secondary | ICD-10-CM | POA: Diagnosis not present

## 2015-04-14 DIAGNOSIS — I480 Paroxysmal atrial fibrillation: Secondary | ICD-10-CM | POA: Diagnosis not present

## 2015-05-01 DIAGNOSIS — R0602 Shortness of breath: Secondary | ICD-10-CM | POA: Diagnosis not present

## 2015-05-28 DIAGNOSIS — R0602 Shortness of breath: Secondary | ICD-10-CM | POA: Diagnosis not present

## 2015-05-28 DIAGNOSIS — I25810 Atherosclerosis of coronary artery bypass graft(s) without angina pectoris: Secondary | ICD-10-CM | POA: Diagnosis not present

## 2015-05-28 DIAGNOSIS — Z951 Presence of aortocoronary bypass graft: Secondary | ICD-10-CM | POA: Diagnosis not present

## 2015-05-28 DIAGNOSIS — R5382 Chronic fatigue, unspecified: Secondary | ICD-10-CM | POA: Diagnosis not present

## 2015-05-28 DIAGNOSIS — E7800 Pure hypercholesterolemia, unspecified: Secondary | ICD-10-CM | POA: Diagnosis not present

## 2015-05-28 DIAGNOSIS — Z6827 Body mass index (BMI) 27.0-27.9, adult: Secondary | ICD-10-CM | POA: Diagnosis not present

## 2015-05-28 DIAGNOSIS — I341 Nonrheumatic mitral (valve) prolapse: Secondary | ICD-10-CM | POA: Diagnosis not present

## 2015-05-28 DIAGNOSIS — R5383 Other fatigue: Secondary | ICD-10-CM | POA: Diagnosis not present

## 2015-05-28 DIAGNOSIS — R0609 Other forms of dyspnea: Secondary | ICD-10-CM | POA: Diagnosis not present

## 2015-07-07 DIAGNOSIS — H16223 Keratoconjunctivitis sicca, not specified as Sjogren's, bilateral: Secondary | ICD-10-CM | POA: Diagnosis not present

## 2015-07-07 DIAGNOSIS — Z961 Presence of intraocular lens: Secondary | ICD-10-CM | POA: Diagnosis not present

## 2015-07-07 DIAGNOSIS — H02831 Dermatochalasis of right upper eyelid: Secondary | ICD-10-CM | POA: Diagnosis not present

## 2015-07-07 DIAGNOSIS — H43393 Other vitreous opacities, bilateral: Secondary | ICD-10-CM | POA: Diagnosis not present

## 2015-07-07 DIAGNOSIS — H18003 Unspecified corneal deposit, bilateral: Secondary | ICD-10-CM | POA: Diagnosis not present

## 2015-07-07 DIAGNOSIS — H52203 Unspecified astigmatism, bilateral: Secondary | ICD-10-CM | POA: Diagnosis not present

## 2015-07-07 DIAGNOSIS — Z83511 Family history of glaucoma: Secondary | ICD-10-CM | POA: Diagnosis not present

## 2015-07-18 DIAGNOSIS — S4991XA Unspecified injury of right shoulder and upper arm, initial encounter: Secondary | ICD-10-CM | POA: Diagnosis not present

## 2015-07-18 DIAGNOSIS — R52 Pain, unspecified: Secondary | ICD-10-CM | POA: Diagnosis not present

## 2015-07-18 DIAGNOSIS — M75101 Unspecified rotator cuff tear or rupture of right shoulder, not specified as traumatic: Secondary | ICD-10-CM | POA: Diagnosis not present

## 2015-08-28 DIAGNOSIS — Z6827 Body mass index (BMI) 27.0-27.9, adult: Secondary | ICD-10-CM | POA: Diagnosis not present

## 2015-08-28 DIAGNOSIS — M25512 Pain in left shoulder: Secondary | ICD-10-CM | POA: Diagnosis not present

## 2015-08-28 DIAGNOSIS — Z9889 Other specified postprocedural states: Secondary | ICD-10-CM | POA: Diagnosis not present

## 2015-08-28 DIAGNOSIS — M19012 Primary osteoarthritis, left shoulder: Secondary | ICD-10-CM | POA: Diagnosis not present

## 2015-08-28 DIAGNOSIS — I25810 Atherosclerosis of coronary artery bypass graft(s) without angina pectoris: Secondary | ICD-10-CM | POA: Diagnosis not present

## 2015-08-28 DIAGNOSIS — E782 Mixed hyperlipidemia: Secondary | ICD-10-CM | POA: Diagnosis not present

## 2015-08-28 DIAGNOSIS — M75101 Unspecified rotator cuff tear or rupture of right shoulder, not specified as traumatic: Secondary | ICD-10-CM | POA: Diagnosis not present

## 2015-08-28 DIAGNOSIS — I341 Nonrheumatic mitral (valve) prolapse: Secondary | ICD-10-CM | POA: Diagnosis not present

## 2015-08-28 DIAGNOSIS — Z951 Presence of aortocoronary bypass graft: Secondary | ICD-10-CM | POA: Diagnosis not present

## 2015-10-03 DIAGNOSIS — Z23 Encounter for immunization: Secondary | ICD-10-CM | POA: Diagnosis not present

## 2015-11-11 DIAGNOSIS — H02834 Dermatochalasis of left upper eyelid: Secondary | ICD-10-CM | POA: Diagnosis not present

## 2015-11-11 DIAGNOSIS — H524 Presbyopia: Secondary | ICD-10-CM | POA: Diagnosis not present

## 2015-11-11 DIAGNOSIS — H5203 Hypermetropia, bilateral: Secondary | ICD-10-CM | POA: Diagnosis not present

## 2015-11-11 DIAGNOSIS — H52203 Unspecified astigmatism, bilateral: Secondary | ICD-10-CM | POA: Diagnosis not present

## 2015-11-11 DIAGNOSIS — H02831 Dermatochalasis of right upper eyelid: Secondary | ICD-10-CM | POA: Diagnosis not present

## 2015-12-25 DIAGNOSIS — H02834 Dermatochalasis of left upper eyelid: Secondary | ICD-10-CM | POA: Diagnosis not present

## 2015-12-25 DIAGNOSIS — H02831 Dermatochalasis of right upper eyelid: Secondary | ICD-10-CM | POA: Diagnosis not present

## 2016-02-26 DIAGNOSIS — I25810 Atherosclerosis of coronary artery bypass graft(s) without angina pectoris: Secondary | ICD-10-CM | POA: Diagnosis not present

## 2016-02-26 DIAGNOSIS — E782 Mixed hyperlipidemia: Secondary | ICD-10-CM | POA: Diagnosis not present

## 2016-02-26 DIAGNOSIS — I341 Nonrheumatic mitral (valve) prolapse: Secondary | ICD-10-CM | POA: Diagnosis not present

## 2016-02-26 DIAGNOSIS — Z951 Presence of aortocoronary bypass graft: Secondary | ICD-10-CM | POA: Diagnosis not present

## 2016-03-01 DIAGNOSIS — R600 Localized edema: Secondary | ICD-10-CM | POA: Diagnosis not present

## 2016-03-01 DIAGNOSIS — E7800 Pure hypercholesterolemia, unspecified: Secondary | ICD-10-CM | POA: Diagnosis not present

## 2016-03-01 DIAGNOSIS — I480 Paroxysmal atrial fibrillation: Secondary | ICD-10-CM | POA: Diagnosis not present

## 2016-03-01 DIAGNOSIS — I2510 Atherosclerotic heart disease of native coronary artery without angina pectoris: Secondary | ICD-10-CM | POA: Diagnosis not present

## 2016-03-01 DIAGNOSIS — Z0000 Encounter for general adult medical examination without abnormal findings: Secondary | ICD-10-CM | POA: Diagnosis not present

## 2016-03-01 DIAGNOSIS — E876 Hypokalemia: Secondary | ICD-10-CM | POA: Diagnosis not present

## 2016-07-14 DIAGNOSIS — H2012 Chronic iridocyclitis, left eye: Secondary | ICD-10-CM | POA: Diagnosis not present

## 2016-07-14 DIAGNOSIS — H01002 Unspecified blepharitis right lower eyelid: Secondary | ICD-10-CM | POA: Diagnosis not present

## 2016-07-14 DIAGNOSIS — H16223 Keratoconjunctivitis sicca, not specified as Sjogren's, bilateral: Secondary | ICD-10-CM | POA: Diagnosis not present

## 2016-07-14 DIAGNOSIS — H01004 Unspecified blepharitis left upper eyelid: Secondary | ICD-10-CM | POA: Diagnosis not present

## 2016-07-14 DIAGNOSIS — H01005 Unspecified blepharitis left lower eyelid: Secondary | ICD-10-CM | POA: Diagnosis not present

## 2016-07-14 DIAGNOSIS — H01001 Unspecified blepharitis right upper eyelid: Secondary | ICD-10-CM | POA: Diagnosis not present

## 2016-07-23 DIAGNOSIS — H2012 Chronic iridocyclitis, left eye: Secondary | ICD-10-CM | POA: Diagnosis not present

## 2016-08-11 DIAGNOSIS — H2012 Chronic iridocyclitis, left eye: Secondary | ICD-10-CM | POA: Diagnosis not present

## 2016-08-11 DIAGNOSIS — H01002 Unspecified blepharitis right lower eyelid: Secondary | ICD-10-CM | POA: Diagnosis not present

## 2016-08-11 DIAGNOSIS — H18003 Unspecified corneal deposit, bilateral: Secondary | ICD-10-CM | POA: Diagnosis not present

## 2016-08-11 DIAGNOSIS — H0012 Chalazion right lower eyelid: Secondary | ICD-10-CM | POA: Diagnosis not present

## 2016-08-11 DIAGNOSIS — H43393 Other vitreous opacities, bilateral: Secondary | ICD-10-CM | POA: Diagnosis not present

## 2016-08-11 DIAGNOSIS — H52203 Unspecified astigmatism, bilateral: Secondary | ICD-10-CM | POA: Diagnosis not present

## 2016-08-11 DIAGNOSIS — H01001 Unspecified blepharitis right upper eyelid: Secondary | ICD-10-CM | POA: Diagnosis not present

## 2016-08-11 DIAGNOSIS — Z961 Presence of intraocular lens: Secondary | ICD-10-CM | POA: Diagnosis not present

## 2016-08-11 DIAGNOSIS — H524 Presbyopia: Secondary | ICD-10-CM | POA: Diagnosis not present

## 2016-08-11 DIAGNOSIS — H16223 Keratoconjunctivitis sicca, not specified as Sjogren's, bilateral: Secondary | ICD-10-CM | POA: Diagnosis not present

## 2016-08-11 DIAGNOSIS — H01004 Unspecified blepharitis left upper eyelid: Secondary | ICD-10-CM | POA: Diagnosis not present

## 2016-08-11 DIAGNOSIS — H01005 Unspecified blepharitis left lower eyelid: Secondary | ICD-10-CM | POA: Diagnosis not present

## 2016-08-25 DIAGNOSIS — Z6826 Body mass index (BMI) 26.0-26.9, adult: Secondary | ICD-10-CM | POA: Diagnosis not present

## 2016-08-25 DIAGNOSIS — R001 Bradycardia, unspecified: Secondary | ICD-10-CM | POA: Diagnosis not present

## 2016-08-31 DIAGNOSIS — R001 Bradycardia, unspecified: Secondary | ICD-10-CM | POA: Diagnosis not present

## 2016-09-01 DIAGNOSIS — R944 Abnormal results of kidney function studies: Secondary | ICD-10-CM | POA: Diagnosis not present

## 2016-09-02 DIAGNOSIS — R001 Bradycardia, unspecified: Secondary | ICD-10-CM | POA: Diagnosis not present

## 2016-09-14 DIAGNOSIS — R001 Bradycardia, unspecified: Secondary | ICD-10-CM | POA: Diagnosis not present

## 2016-10-23 DIAGNOSIS — Z23 Encounter for immunization: Secondary | ICD-10-CM | POA: Diagnosis not present

## 2016-11-04 DIAGNOSIS — E785 Hyperlipidemia, unspecified: Secondary | ICD-10-CM | POA: Diagnosis not present

## 2016-11-04 DIAGNOSIS — Z7982 Long term (current) use of aspirin: Secondary | ICD-10-CM | POA: Diagnosis not present

## 2016-11-04 DIAGNOSIS — Z951 Presence of aortocoronary bypass graft: Secondary | ICD-10-CM | POA: Diagnosis not present

## 2016-11-04 DIAGNOSIS — Z87891 Personal history of nicotine dependence: Secondary | ICD-10-CM | POA: Diagnosis not present

## 2016-11-04 DIAGNOSIS — I2510 Atherosclerotic heart disease of native coronary artery without angina pectoris: Secondary | ICD-10-CM | POA: Diagnosis not present

## 2016-11-04 DIAGNOSIS — Z952 Presence of prosthetic heart valve: Secondary | ICD-10-CM | POA: Diagnosis not present

## 2016-11-04 DIAGNOSIS — R42 Dizziness and giddiness: Secondary | ICD-10-CM | POA: Diagnosis not present

## 2016-11-04 DIAGNOSIS — I341 Nonrheumatic mitral (valve) prolapse: Secondary | ICD-10-CM | POA: Diagnosis not present

## 2016-11-05 DIAGNOSIS — R001 Bradycardia, unspecified: Secondary | ICD-10-CM | POA: Diagnosis not present

## 2016-11-30 DIAGNOSIS — H10011 Acute follicular conjunctivitis, right eye: Secondary | ICD-10-CM | POA: Diagnosis not present

## 2016-12-03 DIAGNOSIS — H10011 Acute follicular conjunctivitis, right eye: Secondary | ICD-10-CM | POA: Diagnosis not present

## 2016-12-13 DIAGNOSIS — H10011 Acute follicular conjunctivitis, right eye: Secondary | ICD-10-CM | POA: Diagnosis not present

## 2016-12-15 DIAGNOSIS — I480 Paroxysmal atrial fibrillation: Secondary | ICD-10-CM | POA: Diagnosis not present

## 2016-12-15 DIAGNOSIS — R42 Dizziness and giddiness: Secondary | ICD-10-CM | POA: Diagnosis not present

## 2016-12-15 DIAGNOSIS — Z9889 Other specified postprocedural states: Secondary | ICD-10-CM | POA: Diagnosis not present

## 2016-12-15 DIAGNOSIS — R001 Bradycardia, unspecified: Secondary | ICD-10-CM | POA: Diagnosis not present

## 2016-12-15 DIAGNOSIS — Z7982 Long term (current) use of aspirin: Secondary | ICD-10-CM | POA: Diagnosis not present

## 2016-12-15 DIAGNOSIS — I2510 Atherosclerotic heart disease of native coronary artery without angina pectoris: Secondary | ICD-10-CM | POA: Diagnosis not present

## 2016-12-15 DIAGNOSIS — Z951 Presence of aortocoronary bypass graft: Secondary | ICD-10-CM | POA: Diagnosis not present

## 2016-12-15 DIAGNOSIS — E782 Mixed hyperlipidemia: Secondary | ICD-10-CM | POA: Diagnosis not present

## 2016-12-22 DIAGNOSIS — H539 Unspecified visual disturbance: Secondary | ICD-10-CM | POA: Diagnosis not present

## 2016-12-22 DIAGNOSIS — R42 Dizziness and giddiness: Secondary | ICD-10-CM | POA: Diagnosis not present

## 2016-12-23 DIAGNOSIS — R42 Dizziness and giddiness: Secondary | ICD-10-CM | POA: Diagnosis not present

## 2016-12-23 DIAGNOSIS — I639 Cerebral infarction, unspecified: Secondary | ICD-10-CM | POA: Diagnosis not present

## 2016-12-30 DIAGNOSIS — I6509 Occlusion and stenosis of unspecified vertebral artery: Secondary | ICD-10-CM | POA: Diagnosis not present

## 2016-12-30 DIAGNOSIS — I6501 Occlusion and stenosis of right vertebral artery: Secondary | ICD-10-CM | POA: Diagnosis not present

## 2017-02-01 DIAGNOSIS — Z8673 Personal history of transient ischemic attack (TIA), and cerebral infarction without residual deficits: Secondary | ICD-10-CM | POA: Diagnosis not present

## 2017-02-01 DIAGNOSIS — H539 Unspecified visual disturbance: Secondary | ICD-10-CM | POA: Diagnosis not present

## 2017-02-01 DIAGNOSIS — R42 Dizziness and giddiness: Secondary | ICD-10-CM | POA: Diagnosis not present

## 2017-02-01 DIAGNOSIS — I480 Paroxysmal atrial fibrillation: Secondary | ICD-10-CM | POA: Diagnosis not present

## 2017-02-14 DIAGNOSIS — H0100B Unspecified blepharitis left eye, upper and lower eyelids: Secondary | ICD-10-CM | POA: Diagnosis not present

## 2017-02-14 DIAGNOSIS — H04123 Dry eye syndrome of bilateral lacrimal glands: Secondary | ICD-10-CM | POA: Diagnosis not present

## 2017-02-14 DIAGNOSIS — H0100A Unspecified blepharitis right eye, upper and lower eyelids: Secondary | ICD-10-CM | POA: Diagnosis not present

## 2017-03-02 DIAGNOSIS — E876 Hypokalemia: Secondary | ICD-10-CM | POA: Diagnosis not present

## 2017-03-02 DIAGNOSIS — E7800 Pure hypercholesterolemia, unspecified: Secondary | ICD-10-CM | POA: Diagnosis not present

## 2017-03-02 DIAGNOSIS — I480 Paroxysmal atrial fibrillation: Secondary | ICD-10-CM | POA: Diagnosis not present

## 2017-03-02 DIAGNOSIS — R600 Localized edema: Secondary | ICD-10-CM | POA: Diagnosis not present

## 2017-03-02 DIAGNOSIS — Z0000 Encounter for general adult medical examination without abnormal findings: Secondary | ICD-10-CM | POA: Diagnosis not present

## 2017-03-02 DIAGNOSIS — I2510 Atherosclerotic heart disease of native coronary artery without angina pectoris: Secondary | ICD-10-CM | POA: Diagnosis not present

## 2017-03-24 DIAGNOSIS — E7800 Pure hypercholesterolemia, unspecified: Secondary | ICD-10-CM | POA: Diagnosis not present

## 2017-04-27 DIAGNOSIS — Z87442 Personal history of urinary calculi: Secondary | ICD-10-CM | POA: Diagnosis not present

## 2017-04-27 DIAGNOSIS — R1032 Left lower quadrant pain: Secondary | ICD-10-CM | POA: Diagnosis not present

## 2017-04-27 DIAGNOSIS — R310 Gross hematuria: Secondary | ICD-10-CM | POA: Diagnosis not present

## 2017-04-27 DIAGNOSIS — N201 Calculus of ureter: Secondary | ICD-10-CM | POA: Diagnosis not present

## 2017-05-03 DIAGNOSIS — N200 Calculus of kidney: Secondary | ICD-10-CM | POA: Diagnosis not present

## 2017-05-03 DIAGNOSIS — R310 Gross hematuria: Secondary | ICD-10-CM | POA: Diagnosis not present

## 2017-05-03 DIAGNOSIS — K5730 Diverticulosis of large intestine without perforation or abscess without bleeding: Secondary | ICD-10-CM | POA: Diagnosis not present

## 2017-05-03 DIAGNOSIS — K8020 Calculus of gallbladder without cholecystitis without obstruction: Secondary | ICD-10-CM | POA: Diagnosis not present

## 2017-05-03 DIAGNOSIS — N400 Enlarged prostate without lower urinary tract symptoms: Secondary | ICD-10-CM | POA: Diagnosis not present

## 2017-05-03 DIAGNOSIS — N201 Calculus of ureter: Secondary | ICD-10-CM | POA: Diagnosis not present

## 2017-06-14 DIAGNOSIS — L237 Allergic contact dermatitis due to plants, except food: Secondary | ICD-10-CM | POA: Diagnosis not present

## 2017-08-01 DIAGNOSIS — Z8673 Personal history of transient ischemic attack (TIA), and cerebral infarction without residual deficits: Secondary | ICD-10-CM | POA: Diagnosis not present

## 2017-08-01 DIAGNOSIS — R42 Dizziness and giddiness: Secondary | ICD-10-CM | POA: Diagnosis not present

## 2017-08-01 DIAGNOSIS — Z7982 Long term (current) use of aspirin: Secondary | ICD-10-CM | POA: Diagnosis not present

## 2017-08-01 DIAGNOSIS — I2510 Atherosclerotic heart disease of native coronary artery without angina pectoris: Secondary | ICD-10-CM | POA: Diagnosis not present

## 2017-08-01 DIAGNOSIS — E7849 Other hyperlipidemia: Secondary | ICD-10-CM | POA: Diagnosis not present

## 2017-08-01 DIAGNOSIS — I480 Paroxysmal atrial fibrillation: Secondary | ICD-10-CM | POA: Diagnosis not present

## 2017-08-01 DIAGNOSIS — E782 Mixed hyperlipidemia: Secondary | ICD-10-CM | POA: Diagnosis not present

## 2017-08-01 DIAGNOSIS — Z951 Presence of aortocoronary bypass graft: Secondary | ICD-10-CM | POA: Diagnosis not present

## 2017-08-08 ENCOUNTER — Encounter (INDEPENDENT_AMBULATORY_CARE_PROVIDER_SITE_OTHER): Payer: Self-pay | Admitting: Orthopedic Surgery

## 2017-08-08 ENCOUNTER — Ambulatory Visit (INDEPENDENT_AMBULATORY_CARE_PROVIDER_SITE_OTHER): Payer: Medicare Other

## 2017-08-08 ENCOUNTER — Ambulatory Visit (INDEPENDENT_AMBULATORY_CARE_PROVIDER_SITE_OTHER): Payer: Medicare Other | Admitting: Orthopedic Surgery

## 2017-08-08 ENCOUNTER — Ambulatory Visit (INDEPENDENT_AMBULATORY_CARE_PROVIDER_SITE_OTHER): Payer: Self-pay

## 2017-08-08 DIAGNOSIS — G8929 Other chronic pain: Secondary | ICD-10-CM

## 2017-08-08 DIAGNOSIS — M12812 Other specific arthropathies, not elsewhere classified, left shoulder: Secondary | ICD-10-CM | POA: Diagnosis not present

## 2017-08-08 DIAGNOSIS — M25511 Pain in right shoulder: Secondary | ICD-10-CM

## 2017-08-08 DIAGNOSIS — M25512 Pain in left shoulder: Secondary | ICD-10-CM | POA: Diagnosis not present

## 2017-08-09 ENCOUNTER — Encounter (INDEPENDENT_AMBULATORY_CARE_PROVIDER_SITE_OTHER): Payer: Self-pay | Admitting: Orthopedic Surgery

## 2017-08-09 DIAGNOSIS — M12812 Other specific arthropathies, not elsewhere classified, left shoulder: Secondary | ICD-10-CM

## 2017-08-09 DIAGNOSIS — M25511 Pain in right shoulder: Secondary | ICD-10-CM | POA: Diagnosis not present

## 2017-08-09 DIAGNOSIS — G8929 Other chronic pain: Secondary | ICD-10-CM | POA: Diagnosis not present

## 2017-08-09 DIAGNOSIS — M25512 Pain in left shoulder: Secondary | ICD-10-CM | POA: Diagnosis not present

## 2017-08-09 MED ORDER — METHYLPREDNISOLONE ACETATE 40 MG/ML IJ SUSP
40.0000 mg | INTRAMUSCULAR | Status: AC | PRN
  Administered 2017-08-09: 40 mg via INTRA_ARTICULAR

## 2017-08-09 MED ORDER — LIDOCAINE HCL 1 % IJ SOLN
5.0000 mL | INTRAMUSCULAR | Status: AC | PRN
  Administered 2017-08-09: 5 mL

## 2017-08-09 MED ORDER — BUPIVACAINE HCL 0.5 % IJ SOLN
9.0000 mL | INTRAMUSCULAR | Status: AC | PRN
  Administered 2017-08-09: 9 mL via INTRA_ARTICULAR

## 2017-08-09 NOTE — Progress Notes (Signed)
Office Visit Note   Patient: [name] [name]           Date of Birth: [birth year]           MRN: [id number] Visit Date: 08/08/2017 Requested by: Blythe Stanford., MD No address on file PCP: Shirline Frees, MD  Subjective: Chief Complaint  Patient presents with  . Right Shoulder - Pain  . Left Shoulder - Pain    HPI: Patient presents for evaluation of left greater than right shoulder pain.  Patient is right-hand dominant.  He takes care of his wife and primarily uses the right hand.  He has to assist the left arm up to shoulder level.  Pain is been going on for several weeks.  He has good and bad days.  He does not take any medication for pain.  He has been using a topical cream without relief.              ROS: All systems reviewed are negative as they relate to the chief complaint within the history of present illness.  Patient denies  fevers or chills.   Assessment & Plan: Visit Diagnoses:  1. Chronic pain of both shoulders   2. Rotator cuff arthropathy of left shoulder     Plan: Impression is rotator cuff arthropathy left shoulder.  Patient has fairly severe functional limitation in that left shoulder as well as pain.  His subacromial injection into the shoulder today for pain relief.  Discussed with [name] the operative and nonoperative management options for this problem.  This would primarily consist of either injections or shoulder replacement of the reverse shoulder replacement variety.  Patient would like to proceed as if shoulder replacement is going to be performed.  We will obtain thin cut CT scan as a preoperative study for reverse shoulder replacement.  I will see him back after that study.  Follow-Up Instructions: No follow-ups on file.   Orders:  Orders Placed This Encounter  Procedures  . XR Shoulder Right  . XR Shoulder Left  . CT SHOULDER LEFT WO CONTRAST   No orders of the defined types were placed in this encounter.     Procedures: Large Joint  Inj: L glenohumeral on 08/09/2017 9:23 PM Indications: diagnostic evaluation and pain Details: 18 G 1.5 in needle, posterior approach  Arthrogram: No  Medications: 9 mL bupivacaine 0.5 %; 40 mg methylPREDNISolone acetate 40 MG/ML; 5 mL lidocaine 1 % Outcome: tolerated well, no immediate complications Procedure, treatment alternatives, risks and benefits explained, specific risks discussed. Consent was given by the patient. Immediately prior to procedure a time out was called to verify the correct patient, procedure, equipment, support staff and site/side marked as required. Patient was prepped and draped in the usual sterile fashion.       Clinical Data: No additional findings.  Objective: Vital Signs: There were no vitals taken for this visit.  Physical Exam:   Constitutional: Patient appears well-developed HEENT:  Head: Normocephalic Eyes:EOM are normal Neck: Normal range of motion Cardiovascular: Normal rate Pulmonary/chest: Effort normal Neurologic: Patient is alert Skin: Skin is warm Psychiatric: Patient has normal mood and affect    Ortho Exam: Orthopedic exam demonstrates normal gait alignment.  Cervical spine range of motion intact.  No masses lymph adenopathy or skin changes noted in that left shoulder girdle region.  He has about 50 degrees of forward flexion and abduction on the left compared to over 90 degrees of forward flexion abduction on the right.  Cuff  strength to infraspinatus and supraspinatus testing predictably weak on the left but subscap testing on the left.  Rotator cuff strength improved globally on the right-hand side.  Is got good motor or sensory function in the hand and the deltoid does fire and is functional.  Specialty Comments:  No specialty comments available.  Imaging: No results found.   PMFS History: Patient Active Problem List   Diagnosis Date Noted  . S/P MVR (mitral valve repair) 10/11/2011  . S/P CABG (coronary artery bypass  graft) 10/11/2011  . Ventricular tachycardia ([facility]) 08/25/2011  . S/P mitral valve repair 08/19/2011  . S/P CABG x 2 08/19/2011  . Thyroid nodule 08/18/2011  . Severe mitral regurgitation 08/12/2011  . CHF (congestive heart failure) ([facility])   . MR (mitral regurgitation)   . Cataract   . Dermatochalasis   . Hypercholesterolemia   . Systolic murmur   . Nephrolithiasis   . PVC (premature ventricular contraction)   . Pseudophakia   . Coronary artery disease 08/10/2011   Past Medical History:  Diagnosis Date  . Arthritis    hands  . Cancer ([facility])    colon cancer 1984  . Cataract   . CHF (congestive heart failure) ([facility])    takes Lasix daily  . Coronary artery disease 08/10/2011  . Dermatochalasis   . Diverticulosis   . Dyslipidemia    takes Niacin daily  . H/O hiatal hernia   . History of colon polyps   . History of kidney stones   . History of seasonal allergies    takes OTC allergy meds prn  . Hx: recurrent pneumonia   . Hypercholesterolemia   . MR (mitral regurgitation)   . MVP (mitral valve prolapse)   . Pneumonia    early July 2013  . Pseudophakia   . PVC (premature ventricular contraction)   . S/P CABG x 2 08/19/2011   LIMA to LAD, SVG to RCA, EVH via left thigh  . S/P mitral valve repair 08/19/2011   Complex valvuloplasty including triangular resection of posterior leaflet, artificial Goretex neocord placement x6 and 24mm Sorin Memo 3D ring annuloplasty  . Shortness of breath    with exertion/lying/sitting  . Systolic murmur   . Thyroid nodule 08/18/2011   3 cm nodule discovered on chest CT scan  . Urinary frequency   . Urinary urgency     Family History  Problem Relation Age of Onset  . Heart disease Father   . Cancer Mother   . Glaucoma Sister   . Prostate cancer Other        Fraternal HX     Past Surgical History:  Procedure Laterality Date  . BUNIONECTOMY    . CARDIAC CATHETERIZATION  08/10/11  . CATARACT EXTRACTION     bilateral  . COLONOSCOPY    .  CORONARY ARTERY BYPASS GRAFT  08/19/2011   Procedure: CORONARY ARTERY BYPASS GRAFTING (CABG);  Surgeon: Rexene Alberts, MD;  Location: [facility];  Service: Open Heart Surgery;  Laterality: N/A;  Coronary Artery Bypass Grafting times two using left internal mammary artery and left greater saphenous vein endoscopiclly harvested  . CYSTOSCOPY     with manipulation of Ureteral Calculus  . ESOPHAGOGASTRODUODENOSCOPY     with dilitation  . HERNIA REPAIR    . LITHOTRIPSY     Whole body extracorporeal shock wave  . LITHOTRIPSY     Left ESL PUA GPE 01/03/2006,04/03/2006  . MITRAL VALVE REPAIR  08/19/2011   Procedure: MITRAL VALVE REPAIR (MVR);  Surgeon: Rexene Alberts, MD;  Location: [facility];  Service: Open Heart Surgery;  Laterality: N/A;  . partial colecotomy  1984   Social History   Occupational History  . Occupation: retired, Chief Financial Officer [employer]  Tobacco Use  . Smoking status: Former Smoker    Types: Cigarettes  . Tobacco comment: quit 33yrs ago  Substance and Sexual Activity  . Alcohol use: No  . Drug use: No  . Sexual activity: Yes

## 2017-08-15 DIAGNOSIS — H40013 Open angle with borderline findings, low risk, bilateral: Secondary | ICD-10-CM | POA: Diagnosis not present

## 2017-08-15 DIAGNOSIS — H43813 Vitreous degeneration, bilateral: Secondary | ICD-10-CM | POA: Diagnosis not present

## 2017-08-15 DIAGNOSIS — Z83511 Family history of glaucoma: Secondary | ICD-10-CM | POA: Diagnosis not present

## 2017-08-15 DIAGNOSIS — H04123 Dry eye syndrome of bilateral lacrimal glands: Secondary | ICD-10-CM | POA: Diagnosis not present

## 2017-08-15 DIAGNOSIS — Z961 Presence of intraocular lens: Secondary | ICD-10-CM | POA: Diagnosis not present

## 2017-08-15 DIAGNOSIS — H18003 Unspecified corneal deposit, bilateral: Secondary | ICD-10-CM | POA: Diagnosis not present

## 2017-08-15 DIAGNOSIS — H52203 Unspecified astigmatism, bilateral: Secondary | ICD-10-CM | POA: Diagnosis not present

## 2017-08-15 DIAGNOSIS — H43393 Other vitreous opacities, bilateral: Secondary | ICD-10-CM | POA: Diagnosis not present

## 2017-08-15 DIAGNOSIS — H524 Presbyopia: Secondary | ICD-10-CM | POA: Diagnosis not present

## 2017-08-18 ENCOUNTER — Telehealth (INDEPENDENT_AMBULATORY_CARE_PROVIDER_SITE_OTHER): Payer: Self-pay | Admitting: Radiology

## 2017-08-18 NOTE — Telephone Encounter (Signed)
[facility] for patient to call and schedule CT review with Dr. Marlou Sa after 08/24/17.

## 2017-08-24 ENCOUNTER — Ambulatory Visit
Admission: RE | Admit: 2017-08-24 | Discharge: 2017-08-24 | Disposition: A | Discharge status: Home / Self Care | Payer: Medicare Other | Source: Ambulatory Visit | Attending: Orthopedic Surgery | Admitting: Orthopedic Surgery

## 2017-08-24 DIAGNOSIS — M12812 Other specific arthropathies, not elsewhere classified, left shoulder: Secondary | ICD-10-CM

## 2017-08-24 DIAGNOSIS — M25511 Pain in right shoulder: Principal | ICD-10-CM

## 2017-08-24 DIAGNOSIS — G8929 Other chronic pain: Secondary | ICD-10-CM

## 2017-08-24 DIAGNOSIS — M25512 Pain in left shoulder: Principal | ICD-10-CM

## 2017-08-26 ENCOUNTER — Telehealth (INDEPENDENT_AMBULATORY_CARE_PROVIDER_SITE_OTHER): Payer: Self-pay | Admitting: Orthopedic Surgery

## 2017-08-26 NOTE — Telephone Encounter (Signed)
CT scan was completed on 08/24/17 for preoperative study and CD has been delivered from [facility]. I do not see a follow up appointment for this patient, however it is mentioned in dictation that patient is to return after the study.

## 2017-08-26 NOTE — Telephone Encounter (Signed)
IC s/w patient and scheduled appt to discuss results.

## 2017-08-30 ENCOUNTER — Other Ambulatory Visit: Payer: Self-pay

## 2017-08-30 ENCOUNTER — Other Ambulatory Visit (INDEPENDENT_AMBULATORY_CARE_PROVIDER_SITE_OTHER): Payer: Self-pay

## 2017-08-30 DIAGNOSIS — E041 Nontoxic single thyroid nodule: Secondary | ICD-10-CM

## 2017-08-30 NOTE — Progress Notes (Signed)
And I will see him back also at some point next week and we can make further plans about surgical intervention for the shoulder

## 2017-09-02 ENCOUNTER — Ambulatory Visit
Admission: RE | Admit: 2017-09-02 | Discharge: 2017-09-02 | Disposition: A | Discharge status: Home / Self Care | Payer: Medicare Other | Source: Ambulatory Visit | Attending: Orthopedic Surgery | Admitting: Orthopedic Surgery

## 2017-09-02 DIAGNOSIS — E041 Nontoxic single thyroid nodule: Secondary | ICD-10-CM

## 2017-09-05 ENCOUNTER — Ambulatory Visit (INDEPENDENT_AMBULATORY_CARE_PROVIDER_SITE_OTHER): Payer: Medicare Other | Admitting: Orthopedic Surgery

## 2017-09-05 ENCOUNTER — Encounter (INDEPENDENT_AMBULATORY_CARE_PROVIDER_SITE_OTHER): Payer: Self-pay | Admitting: Orthopedic Surgery

## 2017-09-05 DIAGNOSIS — M12812 Other specific arthropathies, not elsewhere classified, left shoulder: Secondary | ICD-10-CM | POA: Diagnosis not present

## 2017-09-06 ENCOUNTER — Encounter (INDEPENDENT_AMBULATORY_CARE_PROVIDER_SITE_OTHER): Payer: Self-pay | Admitting: Orthopedic Surgery

## 2017-09-06 NOTE — Progress Notes (Signed)
Office Visit Note   Patient: [name] [name]           Date of Birth: Dec 24, 1929           MRN: [id number] Visit Date: 09/05/2017 Requested by: Shirline Frees, MD [place], [place] [address] PCP: Shirline Frees, MD  Subjective: Chief Complaint  Patient presents with  . Left Shoulder - Follow-up    HPI: Patient presents for follow-up of left shoulder pain.  Patient has known rotator cuff arthropathy affecting the left shoulder.  Last clinic visit he received a injection which did help his symptoms.  Currently he has less pain and more functional range of motion.  He is helping his wife with chronic illness.  He is taking over-the-counter medications as needed.  Since I have seen him he has had a CT scan which is reviewed as part of a preoperative template for possible reverse shoulder replacement.              ROS: All systems reviewed are negative as they relate to the chief complaint within the history of present illness.  Patient denies  fevers or chills.   Assessment & Plan: Visit Diagnoses:  1. Rotator cuff arthropathy of left shoulder     Plan: Impression is less symptomatic rotator cuff arthropathy.  Currently the patient has less pain than he had last clinic visit and has forward flexion and abduction both above 90 degrees.  I think at this time the best option for Tregan would be to reevaluate him in about 3 months.  We could manage this with episodic injections if his function and pain relief remains reasonable.  We will see him back in 3 months for clinical recheck.  Follow-Up Instructions: Return in about 3 months (around 12/06/2017).   Orders:  No orders of the defined types were placed in this encounter.  No orders of the defined types were placed in this encounter.     Procedures: No procedures performed   Clinical Data: No additional findings.  Objective: Vital Signs: There were no vitals taken for this visit.  Physical  Exam:   Constitutional: Patient appears well-developed HEENT:  Head: Normocephalic Eyes:EOM are normal Neck: Normal range of motion Cardiovascular: Normal rate Pulmonary/chest: Effort normal Neurologic: Patient is alert Skin: Skin is warm Psychiatric: Patient has normal mood and affect    Ortho Exam: Ortho exam demonstrates good cervical spine range of motion.  Deltoid is functional on the left and right.  Radial pulse intact bilaterally.  No other masses lymph adenopathy or skin changes noted in that left shoulder girdle region.  He does have forward in flexion and abduction both above 90 degrees at this time.  Rotator cuff weakness persists to infraspinatus testing but subscap strength is intact.  Specialty Comments:  No specialty comments available.  Imaging: No results found.   PMFS History: Patient Active Problem List   Diagnosis Date Noted  . S/P MVR (mitral valve repair) 10/11/2011  . S/P CABG (coronary artery bypass graft) 10/11/2011  . Ventricular tachycardia ([facility]) 08/25/2011  . S/P mitral valve repair 08/19/2011  . S/P CABG x 2 08/19/2011  . Thyroid nodule 08/18/2011  . Severe mitral regurgitation 08/12/2011  . CHF (congestive heart failure) ([facility])   . MR (mitral regurgitation)   . Cataract   . Dermatochalasis   . Hypercholesterolemia   . Systolic murmur   . Nephrolithiasis   . PVC (premature ventricular contraction)   . Pseudophakia   .  Coronary artery disease 08/10/2011   Past Medical History:  Diagnosis Date  . Arthritis    hands  . Cancer ([facility])    colon cancer 1984  . Cataract   . CHF (congestive heart failure) (H[facility])    takes Lasix daily  . Coronary artery disease 08/10/2011  . Dermatochalasis   . Diverticulosis   . Dyslipidemia    takes Niacin daily  . H/O hiatal hernia   . History of colon polyps   . History of kidney stones   . History of seasonal allergies    takes OTC allergy meds prn  . Hx: recurrent pneumonia   . Hypercholesterolemia    . MR (mitral regurgitation)   . MVP (mitral valve prolapse)   . Pneumonia    early July 2013  . Pseudophakia   . PVC (premature ventricular contraction)   . S/P CABG x 2 08/19/2011   LIMA to LAD, SVG to RCA, EVH via left thigh  . S/P mitral valve repair 08/19/2011   Complex valvuloplasty including triangular resection of posterior leaflet, artificial Goretex neocord placement x6 and 11mm Sorin Memo 3D ring annuloplasty  . Shortness of breath    with exertion/lying/sitting  . Systolic murmur   . Thyroid nodule 08/18/2011   3 cm nodule discovered on chest CT scan  . Urinary frequency   . Urinary urgency     Family History  Problem Relation Age of Onset  . Heart disease Father   . Cancer Mother   . Glaucoma Sister   . Prostate cancer Other        Fraternal HX     Past Surgical History:  Procedure Laterality Date  . BUNIONECTOMY    . CARDIAC CATHETERIZATION  08/10/11  . CATARACT EXTRACTION     bilateral  . COLONOSCOPY    . CORONARY ARTERY BYPASS GRAFT  08/19/2011   Procedure: CORONARY ARTERY BYPASS GRAFTING (CABG);  Surgeon: Rexene Alberts, MD;  Location: [facility];  Service: Open Heart Surgery;  Laterality: N/A;  Coronary Artery Bypass Grafting times two using left internal mammary artery and left greater saphenous vein endoscopiclly harvested  . CYSTOSCOPY     with manipulation of Ureteral Calculus  . ESOPHAGOGASTRODUODENOSCOPY     with dilitation  . HERNIA REPAIR    . LITHOTRIPSY     Whole body extracorporeal shock wave  . LITHOTRIPSY     Left ESL PUA GPE 01/03/2006,04/03/2006  . MITRAL VALVE REPAIR  08/19/2011   Procedure: MITRAL VALVE REPAIR (MVR);  Surgeon: Rexene Alberts, MD;  Location: [facility];  Service: Open Heart Surgery;  Laterality: N/A;  . partial colecotomy  1984   Social History   Occupational History  . Occupation: retired, Chief Financial Officer [employer]  Tobacco Use  . Smoking status: Former Smoker    Types: Cigarettes  . Smokeless tobacco: Never Used  . Tobacco comment: quit  7yrs ago  Substance and Sexual Activity  . Alcohol use: No  . Drug use: No  . Sexual activity: Yes

## 2017-09-15 DIAGNOSIS — N183 Chronic kidney disease, stage 3 (moderate): Secondary | ICD-10-CM | POA: Diagnosis not present

## 2017-09-15 DIAGNOSIS — I480 Paroxysmal atrial fibrillation: Secondary | ICD-10-CM | POA: Diagnosis not present

## 2017-09-15 DIAGNOSIS — Z23 Encounter for immunization: Secondary | ICD-10-CM | POA: Diagnosis not present

## 2017-09-15 DIAGNOSIS — E7800 Pure hypercholesterolemia, unspecified: Secondary | ICD-10-CM | POA: Diagnosis not present

## 2017-09-15 DIAGNOSIS — N410 Acute prostatitis: Secondary | ICD-10-CM | POA: Diagnosis not present

## 2017-09-15 DIAGNOSIS — I2510 Atherosclerotic heart disease of native coronary artery without angina pectoris: Secondary | ICD-10-CM | POA: Diagnosis not present

## 2017-09-15 DIAGNOSIS — E876 Hypokalemia: Secondary | ICD-10-CM | POA: Diagnosis not present

## 2017-09-15 DIAGNOSIS — Z1211 Encounter for screening for malignant neoplasm of colon: Secondary | ICD-10-CM | POA: Diagnosis not present

## 2017-09-15 DIAGNOSIS — R300 Dysuria: Secondary | ICD-10-CM | POA: Diagnosis not present

## 2017-09-15 DIAGNOSIS — R600 Localized edema: Secondary | ICD-10-CM | POA: Diagnosis not present

## 2017-09-20 DIAGNOSIS — Z1211 Encounter for screening for malignant neoplasm of colon: Secondary | ICD-10-CM | POA: Diagnosis not present

## 2017-09-27 DIAGNOSIS — E041 Nontoxic single thyroid nodule: Secondary | ICD-10-CM | POA: Diagnosis not present

## 2017-10-06 DIAGNOSIS — L814 Other melanin hyperpigmentation: Secondary | ICD-10-CM | POA: Diagnosis not present

## 2017-10-06 DIAGNOSIS — L570 Actinic keratosis: Secondary | ICD-10-CM | POA: Diagnosis not present

## 2017-10-06 DIAGNOSIS — D485 Neoplasm of uncertain behavior of skin: Secondary | ICD-10-CM | POA: Diagnosis not present

## 2017-10-10 DIAGNOSIS — E041 Nontoxic single thyroid nodule: Secondary | ICD-10-CM | POA: Diagnosis not present

## 2017-11-04 ENCOUNTER — Ambulatory Visit (INDEPENDENT_AMBULATORY_CARE_PROVIDER_SITE_OTHER): Payer: Medicare Other | Admitting: Orthopedic Surgery

## 2017-11-04 ENCOUNTER — Encounter (INDEPENDENT_AMBULATORY_CARE_PROVIDER_SITE_OTHER): Payer: Self-pay | Admitting: Orthopedic Surgery

## 2017-11-04 DIAGNOSIS — M12812 Other specific arthropathies, not elsewhere classified, left shoulder: Secondary | ICD-10-CM | POA: Diagnosis not present

## 2017-11-04 NOTE — Progress Notes (Signed)
Office Visit Note   Patient: [name] [name]           Date of Birth: May 30, 1929           MRN: [id number] Visit Date: 11/04/2017 Requested by: Shirline Frees, MD [place], [place] [address] PCP: Shirline Frees, MD  Subjective: Chief Complaint  Patient presents with  . Left Shoulder - Follow-up    HPI: [name] [name] is a patient with left shoulder rotator cuff arthropathy.  He is not doing any better.  Had minimal relief from the injection.  Still is having a lot of pain.  Also has some limitation of function.  Hard for him to lift his arm up overhead.  He states that the functional weakness he has is worse than the pain.  His daughter is moving in with him in November.  His wife has breast cancer and he is taking care of her.  He did have a thyroid issue and that checked out to be okay.              ROS: All systems reviewed are negative as they relate to the chief complaint within the history of present illness.  Patient denies  fevers or chills.   Assessment & Plan: Visit Diagnoses:  1. Rotator cuff arthropathy of left shoulder     Plan: Impression is left shoulder rotator cuff arthropathy with pain and functional limitations.  Abduction is 50 degrees and forward flexion is 60.  I think reverse shoulder replacement would help him in terms of pain relief and improved function.  The risk and benefits are discussed.  Although the patient is 70 he is functionally and physiologically younger appearing than that.  Plan at this time is for him to consider reverse shoulder replacement.  The risk and benefits are discussed including but not limited to infection nerve vessel damage dislocation as well as incomplete functional restoration and pain relief.  Patient understands the risk and benefits.  All questions answered.  Plan for replacement sometime in November or December if he wants to proceed with that.  Follow-Up Instructions: No follow-ups on file.   Orders:    No orders of the defined types were placed in this encounter.  No orders of the defined types were placed in this encounter.     Procedures: No procedures performed   Clinical Data: No additional findings.  Objective: Vital Signs: There were no vitals taken for this visit.  Physical Exam:   Constitutional: Patient appears well-developed HEENT:  Head: Normocephalic Eyes:EOM are normal Neck: Normal range of motion Cardiovascular: Normal rate Pulmonary/chest: Effort normal Neurologic: Patient is alert Skin: Skin is warm Psychiatric: Patient has normal mood and affect    Ortho Exam: Ortho exam demonstrates full active and passive range of motion of the right shoulder.  On the left-hand side he has abduction to 50 and forward flexion actively to 60 degrees.  Predictable cuff weakness but deltoid is functional.  Does have some pain with range of motion but external rotation of 15 degrees of abduction his about 30 or 40 degrees.  Specialty Comments:  No specialty comments available.  Imaging: No results found.   PMFS History: Patient Active Problem List   Diagnosis Date Noted  . S/P MVR (mitral valve repair) 10/11/2011  . S/P CABG (coronary artery bypass graft) 10/11/2011  . Ventricular tachycardia ([facility]) 08/25/2011  . S/P mitral valve repair 08/19/2011  . S/P CABG x 2 08/19/2011  . Thyroid nodule  08/18/2011  . Severe mitral regurgitation 08/12/2011  . CHF (congestive heart failure) ([facility])   . MR (mitral regurgitation)   . Cataract   . Dermatochalasis   . Hypercholesterolemia   . Systolic murmur   . Nephrolithiasis   . PVC (premature ventricular contraction)   . Pseudophakia   . Coronary artery disease 08/10/2011   Past Medical History:  Diagnosis Date  . Arthritis    hands  . Cancer ([facility])    colon cancer 1984  . Cataract   . CHF (congestive heart failure) ([facility])    takes Lasix daily  . Coronary artery disease 08/10/2011  . Dermatochalasis   .  Diverticulosis   . Dyslipidemia    takes Niacin daily  . H/O hiatal hernia   . History of colon polyps   . History of kidney stones   . History of seasonal allergies    takes OTC allergy meds prn  . Hx: recurrent pneumonia   . Hypercholesterolemia   . MR (mitral regurgitation)   . MVP (mitral valve prolapse)   . Pneumonia    early July 2013  . Pseudophakia   . PVC (premature ventricular contraction)   . S/P CABG x 2 08/19/2011   LIMA to LAD, SVG to RCA, EVH via left thigh  . S/P mitral valve repair 08/19/2011   Complex valvuloplasty including triangular resection of posterior leaflet, artificial Goretex neocord placement x6 and 24mm Sorin Memo 3D ring annuloplasty  . Shortness of breath    with exertion/lying/sitting  . Systolic murmur   . Thyroid nodule 08/18/2011   3 cm nodule discovered on chest CT scan  . Urinary frequency   . Urinary urgency     Family History  Problem Relation Age of Onset  . Heart disease Father   . Cancer Mother   . Glaucoma Sister   . Prostate cancer Other        Fraternal HX     Past Surgical History:  Procedure Laterality Date  . BUNIONECTOMY    . CARDIAC CATHETERIZATION  08/10/11  . CATARACT EXTRACTION     bilateral  . COLONOSCOPY    . CORONARY ARTERY BYPASS GRAFT  08/19/2011   Procedure: CORONARY ARTERY BYPASS GRAFTING (CABG);  Surgeon: Rexene Alberts, MD;  Location: [facility];  Service: Open Heart Surgery;  Laterality: N/A;  Coronary Artery Bypass Grafting times two using left internal mammary artery and left greater saphenous vein endoscopiclly harvested  . CYSTOSCOPY     with manipulation of Ureteral Calculus  . ESOPHAGOGASTRODUODENOSCOPY     with dilitation  . HERNIA REPAIR    . LITHOTRIPSY     Whole body extracorporeal shock wave  . LITHOTRIPSY     Left ESL PUA GPE 01/03/2006,04/03/2006  . MITRAL VALVE REPAIR  08/19/2011   Procedure: MITRAL VALVE REPAIR (MVR);  Surgeon: Rexene Alberts, MD;  Location: [facility];  Service: Open Heart Surgery;   Laterality: N/A;  . partial colecotomy  1984   Social History   Occupational History  . Occupation: retired, Chief Financial Officer [employer]  Tobacco Use  . Smoking status: Former Smoker    Types: Cigarettes  . Smokeless tobacco: Never Used  . Tobacco comment: quit 65yrs ago  Substance and Sexual Activity  . Alcohol use: No  . Drug use: No  . Sexual activity: Yes

## 2017-11-07 DIAGNOSIS — Z8673 Personal history of transient ischemic attack (TIA), and cerebral infarction without residual deficits: Secondary | ICD-10-CM | POA: Diagnosis not present

## 2017-11-07 DIAGNOSIS — I25810 Atherosclerosis of coronary artery bypass graft(s) without angina pectoris: Secondary | ICD-10-CM | POA: Diagnosis not present

## 2017-11-07 DIAGNOSIS — Z7901 Long term (current) use of anticoagulants: Secondary | ICD-10-CM | POA: Diagnosis not present

## 2017-11-07 DIAGNOSIS — I519 Heart disease, unspecified: Secondary | ICD-10-CM | POA: Diagnosis not present

## 2017-11-07 DIAGNOSIS — I480 Paroxysmal atrial fibrillation: Secondary | ICD-10-CM | POA: Diagnosis not present

## 2017-11-07 DIAGNOSIS — Z87891 Personal history of nicotine dependence: Secondary | ICD-10-CM | POA: Diagnosis not present

## 2017-11-07 DIAGNOSIS — E782 Mixed hyperlipidemia: Secondary | ICD-10-CM | POA: Diagnosis not present

## 2017-11-07 DIAGNOSIS — Z9889 Other specified postprocedural states: Secondary | ICD-10-CM | POA: Diagnosis not present

## 2017-11-21 DIAGNOSIS — I340 Nonrheumatic mitral (valve) insufficiency: Secondary | ICD-10-CM | POA: Diagnosis not present

## 2017-12-05 ENCOUNTER — Encounter (HOSPITAL_COMMUNITY): Payer: Self-pay

## 2017-12-05 ENCOUNTER — Other Ambulatory Visit (INDEPENDENT_AMBULATORY_CARE_PROVIDER_SITE_OTHER): Payer: Self-pay | Admitting: Orthopedic Surgery

## 2017-12-05 DIAGNOSIS — M12812 Other specific arthropathies, not elsewhere classified, left shoulder: Secondary | ICD-10-CM

## 2017-12-05 NOTE — Pre-Procedure Instructions (Signed)
[name] [name]  12/05/2017      [id number] - [place] [address] [facility] [place] [address] Phone: [phone] Fax: [phone]    Your procedure is scheduled on Tuesday November 26.  Report to [facility] Admitting at 5:30 A.M.  Call this number if you have problems the morning of surgery:  [phone]   Remember:  Do not eat or drink after midnight.    Take these medicines the morning of surgery with A SIP OF WATER:   Amiodarone (Pacerone) Eye drops  7 days prior to surgery STOP taking any Aleve, Naproxen, Ibuprofen, Motrin, Advil, Goody's, BC's, all herbal medications, fish oil, and all vitamins  FOLLOW your surgeon's instructions on stopping Aspirin and Eliquis. If no instructions were given, please call your surgeons office.      Do not wear jewelry, make-up or nail polish.  Do not wear lotions, powders, or perfumes, or deodorant.  Do not shave 48 hours prior to surgery.  Men may shave face and neck.  Do not bring valuables to the hospital.  [facility] is not responsible for any belongings or valuables.  Contacts, dentures or bridgework may not be worn into surgery.  Leave your suitcase in the car.  After surgery it may be brought to your room.  For patients admitted to the hospital, discharge time will be determined by your treatment team.  Patients discharged the day of surgery will not be allowed to drive home.   Special instructions:    [place]- Preparing For Surgery  Before surgery, you can play an important role. Because skin is not sterile, your skin needs to be as free of germs as possible. You can reduce the number of germs on your skin by washing with CHG (chlorahexidine gluconate) Soap before surgery.  CHG is an antiseptic cleaner which kills germs and bonds with the skin to continue killing germs even after washing.    Oral Hygiene is also important to reduce your risk of infection.   Remember - BRUSH YOUR TEETH THE MORNING OF SURGERY WITH YOUR REGULAR TOOTHPASTE  Please do not use if you have an allergy to CHG or antibacterial soaps. If your skin becomes reddened/irritated stop using the CHG.  Do not shave (including legs and underarms) for at least 48 hours prior to first CHG shower. It is OK to shave your face.  Please follow these instructions carefully.   1. Shower the NIGHT BEFORE SURGERY and the MORNING OF SURGERY with CHG.   2. If you chose to wash your hair, wash your hair first as usual with your normal shampoo.  3. After you shampoo, rinse your hair and body thoroughly to remove the shampoo.  4. Use CHG as you would any other liquid soap. You can apply CHG directly to the skin and wash gently with a scrungie or a clean washcloth.   5. Apply the CHG Soap to your body ONLY FROM THE NECK DOWN.  Do not use on open wounds or open sores. Avoid contact with your eyes, ears, mouth and genitals (private parts). Wash Face and genitals (private parts)  with your normal soap.  6. Wash thoroughly, paying special attention to the area where your surgery will be performed.  7. Thoroughly rinse your body with warm water from the neck down.  8. DO NOT shower/wash with your normal soap after using and rinsing off the CHG Soap.  9. Pat yourself dry with a CLEAN  TOWEL.  10. Wear CLEAN PAJAMAS to bed the night before surgery, wear comfortable clothes the morning of surgery  11. Place CLEAN SHEETS on your bed the night of your first shower and DO NOT SLEEP WITH PETS.    Day of Surgery:  Do not apply any deodorants/lotions.  Please wear clean clothes to the hospital/surgery center.   Remember to brush your teeth WITH YOUR REGULAR TOOTHPASTE.    Please read over the following fact sheets that you were given. Coughing and Deep Breathing, MRSA Information and Surgical Site Infection Prevention

## 2017-12-06 ENCOUNTER — Encounter (HOSPITAL_COMMUNITY)
Admission: RE | Admit: 2017-12-06 | Discharge: 2017-12-06 | Disposition: A | Discharge status: Home / Self Care | Payer: Medicare Other | Source: Ambulatory Visit | Attending: Orthopedic Surgery | Admitting: Orthopedic Surgery

## 2017-12-06 ENCOUNTER — Other Ambulatory Visit: Payer: Self-pay

## 2017-12-06 ENCOUNTER — Encounter (HOSPITAL_COMMUNITY): Payer: Self-pay

## 2017-12-06 DIAGNOSIS — I447 Left bundle-branch block, unspecified: Secondary | ICD-10-CM | POA: Insufficient documentation

## 2017-12-06 DIAGNOSIS — I2510 Atherosclerotic heart disease of native coronary artery without angina pectoris: Secondary | ICD-10-CM | POA: Diagnosis not present

## 2017-12-06 DIAGNOSIS — Z01818 Encounter for other preprocedural examination: Secondary | ICD-10-CM | POA: Insufficient documentation

## 2017-12-06 DIAGNOSIS — Z951 Presence of aortocoronary bypass graft: Secondary | ICD-10-CM | POA: Insufficient documentation

## 2017-12-06 DIAGNOSIS — I440 Atrioventricular block, first degree: Secondary | ICD-10-CM | POA: Diagnosis not present

## 2017-12-06 HISTORY — DX: Gastro-esophageal reflux disease without esophagitis: K21.9

## 2017-12-06 LAB — URINALYSIS, ROUTINE W REFLEX MICROSCOPIC
BILIRUBIN URINE: NEGATIVE
GLUCOSE, UA: NEGATIVE mg/dL
HGB URINE DIPSTICK: NEGATIVE
Ketones, ur: NEGATIVE mg/dL
Leukocytes, UA: NEGATIVE
Nitrite: NEGATIVE
PH: 5 (ref 5.0–8.0)
PROTEIN: NEGATIVE mg/dL
Specific Gravity, Urine: 1.027 (ref 1.005–1.030)

## 2017-12-06 LAB — BASIC METABOLIC PANEL
Anion gap: 7 (ref 5–15)
BUN: 25 mg/dL — AB (ref 8–23)
CALCIUM: 8.9 mg/dL (ref 8.9–10.3)
CO2: 23 mmol/L (ref 22–32)
Chloride: 109 mmol/L (ref 98–111)
Creatinine, Ser: 1.12 mg/dL (ref 0.61–1.24)
GFR calc Af Amer: >60 mL/min (ref >60)
GFR calc non Af Amer: 58 mL/min — ABNORMAL LOW (ref >60)
Glucose, Bld: 92 mg/dL (ref 70–99)
Potassium: 4.5 mmol/L (ref 3.5–5.1)
Sodium: 139 mmol/L (ref 135–145)

## 2017-12-06 LAB — CBC
HEMATOCRIT: 39.9 % (ref 39.0–52.0)
Hemoglobin: 12.3 g/dL — ABNORMAL LOW (ref 13.0–17.0)
MCH: 30.1 pg (ref 26.0–34.0)
MCHC: 30.8 g/dL (ref 30.0–36.0)
MCV: 97.8 fL (ref 80.0–100.0)
PLATELETS: 152 10*3/uL (ref 150–400)
RBC: 4.08 MIL/uL — ABNORMAL LOW (ref 4.22–5.81)
RDW: 13 % (ref 11.5–15.5)
WBC: 7 10*3/uL (ref 4.0–10.5)
nRBC: 0 % (ref 0.0–0.2)

## 2017-12-06 LAB — SURGICAL PCR SCREEN
MRSA, PCR: NEGATIVE
Staphylococcus aureus: NEGATIVE

## 2017-12-06 NOTE — Progress Notes (Signed)
Has his dr given [place] elequis plan?

## 2017-12-06 NOTE — Progress Notes (Signed)
PCP - Dr. Shirline Frees Cardiologist - Dr. Dondra Prader (care everywhere)  EKG - 12/06/2017  Stress Test - 2018-CE ECHO - 11/21/17- [facility] Cardiac Cath -2013 in epic   Blood Thinner Instructions:Pt states he is waiting for Dr. Randel Pigg office to contact him regarding ASA and Eliquis. Pt states he will try to call office again today.    Anesthesia review: hx CAD, CABG, MVR, Cardiologist Dr. Dondra Prader  Patient denies shortness of breath, fever, cough and chest pain at PAT appointment   Patient verbalized understanding of instructions that were given to them at the PAT appointment. Patient was also instructed that they will need to review over the PAT instructions again at home before surgery.

## 2017-12-07 LAB — URINE CULTURE: CULTURE: NO GROWTH

## 2017-12-07 NOTE — Progress Notes (Signed)
Anesthesia Chart Review:  Case:  [id number] Date/Time:  12/13/17 0715   Procedure:  LEFT REVERSE SHOULDER ARTHROPLASTY (Left )   Anesthesia type:  General   Pre-op diagnosis:  left shouder rotation with arthropathy   Location:  [facility] OR ROOM 06 / [facility]   Surgeon:  Meredith Pel, MD     DISCUSSION: 84 yo male former smoker. Pertinent hx includes CAD s/p CABG 2013, post op atrial fibrillation (NSR on Amiodarone and Eliquis), stroke, MVR 2013, hyperlipidemia, and CVA.  Follows with cardiology at [facility], Dr. Carolee Rota. Per his last OV note 10/29/2017: "He has been doing well since last seen. He tells me he has noted some LE swelling that has started over the last 3 weeks. He denies any chest pain, shob, dizziness, palpitations, syncope, PND and orthopnea. He denies the use of extra sodium in his diet. He is not very active other then general work around the house. He recently had testing done for pain in left should which was found a thyroid nodule. He had thyroid u/s and biopsy and is following with ENT. I will obtain an echocardiogram to evaluate his LV function and mitral valve. I will check labs today, TSH, CBC, TSH and CMP. I will have him increase the lasix to 40 mg for 3 days and then back to 20 mg daily. I have asked him to keep leg elevated."  Echo 11/21/2017 showed EF 50-55%, mod MR, mild-mod TR, severely dilated LA, normal RV size and function, mild pulm HTN.  Dr. Randel Pigg office is contacting Dr. Wyline Copas for blood thinner recommendations.  Anticipate he can proceed as planned barring acute status change.  VS: BP (!) 125/59   Pulse 76   Temp 37.2 C   Resp 20   Ht 5\' 8"  (1.727 m)   Wt 76.7 kg   SpO2 95%   BMI 25.73 kg/m   PROVIDERS: Shirline Frees, MD is PCP  Towana Badger, MD is Cardiologist  LABS: Labs reviewed: Acceptable for surgery. (all labs ordered are listed, but only abnormal results are displayed)  Labs Reviewed  BASIC METABOLIC PANEL - Abnormal; Notable for  the following components:      Result Value   BUN 25 (*)    GFR calc non Af Amer 58 (*)    All other components within normal limits  CBC - Abnormal; Notable for the following components:   RBC 4.08 (*)    Hemoglobin 12.3 (*)    All other components within normal limits  URINALYSIS, ROUTINE W REFLEX MICROSCOPIC - Abnormal; Notable for the following components:   Color, Urine AMBER (*)    All other components within normal limits  SURGICAL PCR SCREEN  URINE CULTURE     EKG: 12/06/17: Sinus rhythm with 1st degree A-V block. Rate 74.Incomplete left bundle branch block  CV: TTE 11/21/2017 (care everywhere): Findings Mitral Valve Moderate mitral regurgitation. Aortic Valve The aortic valve appears to be trileaflet. Mild-moderate aortic regurgitation. Tricuspid Valve Tricuspid valve is structurally normal. Mild to moderate tricuspid regurgitation. Pulmonic Valve The pulmonic valve was not well visualized. Mild pulmonic regurgitation. Left Atrium Severely dilated left atrium. Left Ventricle Ejection fraction is visually estimated at 50-55%. Indeterminate diastolic function. Right Atrium Normal right atrium. Right Ventricle Normal right ventricular size and function. Right ventricular systolic pressure of 34 mm Hg consistent with mild pulmonary hypertension. Pericardial Effusion No evidence of pericardial effusion. Pleural Effusion No evidence of pleural effusion. Miscellaneous The aortic root diameter is within normal limits. Ascending  aorta is within normal limits. IVC is normal and collapses.  Cardiac event monitor 12/04/2016: Baseline Rhythm: NSR  Rhythm Findings:  1.Ventricular: None  2.Supraventricular: None  3.Bradyarrhythmias and Pauses: None  Symptoms reported:None  CONCLUSIONS: 1.Normal study. Pt informed during office visit.   Lexiscan 08/31/2016:  Summary  Well tolerated Lexiscan perfusion stress test, with no clinical ischemia   during vasodilator stress.  There is a medium sized moderate intensity fixed defect of the inferolateral  wall consistent with a site of prior non-transmural MI without ischemia.  Gated imaging shows inferolateral hypokinesis with low normal LV function  with an ejection fraction of 52%   Past Medical History:  Diagnosis Date  . Arthritis    hands  . Cancer ([place])    colon cancer 1984  . Cataract   . CHF (congestive heart failure) (HCC)    takes Lasix daily  . Coronary artery disease 08/10/2011  . Dermatochalasis   . Diverticulosis   . Dyslipidemia    takes Niacin daily  . GERD (gastroesophageal reflux disease)    sometimes d/t food  . H/O hiatal hernia   . History of colon polyps   . History of kidney stones   . History of seasonal allergies    takes OTC allergy meds prn  . Hx: recurrent pneumonia   . Hypercholesterolemia   . MR (mitral regurgitation)   . MVP (mitral valve prolapse)   . Pneumonia    early July 2013  . Pseudophakia   . PVC (premature ventricular contraction)   . S/P CABG x 2 08/19/2011   LIMA to LAD, SVG to RCA, EVH via left thigh  . S/P mitral valve repair 08/19/2011   Complex valvuloplasty including triangular resection of posterior leaflet, artificial Goretex neocord placement x6 and 106mm Sorin Memo 3D ring annuloplasty  . Shortness of breath    with exertion/lying/sitting  . Systolic murmur   . Thyroid nodule 08/18/2011   3 cm nodule discovered on chest CT scan  . Urinary frequency   . Urinary urgency     Past Surgical History:  Procedure Laterality Date  . BUNIONECTOMY    . CARDIAC CATHETERIZATION  08/10/11  . CATARACT EXTRACTION     bilateral  . COLONOSCOPY    . CORONARY ARTERY BYPASS GRAFT  08/19/2011   Procedure: CORONARY ARTERY BYPASS GRAFTING (CABG);  Surgeon: Rexene Alberts, MD;  Location: [facility];  Service: Open Heart Surgery;  Laterality: N/A;  Coronary Artery Bypass Grafting times two using left internal mammary artery and left greater  saphenous vein endoscopiclly harvested  . CYSTOSCOPY     with manipulation of Ureteral Calculus  . ESOPHAGOGASTRODUODENOSCOPY     with dilitation  . HERNIA REPAIR    . LITHOTRIPSY     Whole body extracorporeal shock wave  . LITHOTRIPSY     Left ESL PUA GPE 01/03/2006,04/03/2006  . MITRAL VALVE REPAIR  08/19/2011   Procedure: MITRAL VALVE REPAIR (MVR);  Surgeon: Rexene Alberts, MD;  Location: [facility];  Service: Open Heart Surgery;  Laterality: N/A;  . partial colecotomy  1984    MEDICATIONS: . amiodarone (PACERONE) 200 MG tablet  . apixaban (ELIQUIS) 5 MG TABS tablet  . aspirin EC 81 MG tablet  . furosemide (LASIX) 20 MG tablet  . hydroxypropyl methylcellulose / hypromellose (ISOPTO TEARS / GONIOVISC) 2.5 % ophthalmic solution  . Multiple Vitamin (MULTIVITAMIN WITH MINERALS) TABS tablet  . Omega-3 Fatty Acids (FISH OIL) 1200 MG CAPS  . potassium chloride SA (K-DUR,KLOR-CON)  20 MEQ tablet  . simvastatin (ZOCOR) 20 MG tablet   No current facility-administered medications for this encounter.     Wynonia Musty [facility] Short Stay Center/Anesthesiology Phone [phone] 12/07/2017 11:31 AM

## 2017-12-07 NOTE — Anesthesia Preprocedure Evaluation (Addendum)
Anesthesia Evaluation  Patient identified by MRN, date of birth, ID band Patient awake    Reviewed: Allergy & Precautions, NPO status , Patient's Chart, lab work & pertinent test results  Airway Mallampati: III  TM Distance: >3 FB Neck ROM: Full    Dental no notable dental hx. (+) Teeth Intact, Dental Advisory Given   Pulmonary neg pulmonary ROS, former smoker,    Pulmonary exam normal breath sounds clear to auscultation       Cardiovascular + CAD, + CABG (2v CABG 2013) and +CHF  Normal cardiovascular exam+ dysrhythmias (on amiodarone and eliquis) Atrial Fibrillation + Valvular Problems/Murmurs (s/p MV repair 2013) MR and MVP  Rhythm:Regular Rate:Normal  TTE 11/2017 Moderate mitral regurgitation. The aortic valve appears to be trileaflet. Mild-moderate aortic regurgitation. Tricuspid valve is structurally normal. Mild to moderate tricuspid regurgitation. The pulmonic valve was not well visualized. Mild pulmonic regurgitation. Severely dilated left atrium. Ejection fraction is visually estimated at 50-55%. Indeterminate diastolic function. Normal right ventricular size and function. Right ventricular systolic pressure of 34 mm Hg consistent with mild pulmonary hypertension.  EKG: 12/06/17: Sinus rhythm with 1st degree A-V block. Rate 74. Incomplete left bundle branch block  Cardiac event monitor 12/04/2016: Baseline Rhythm: NSR Rhythm Findings: 1.Ventricular: None 2.Supraventricular: None 3.Bradyarrhythmias and Pauses: None Symptoms reported:None  Lexiscan 08/31/2016: Well tolerated Lexiscan perfusion stress test, with no clinical ischemia during vasodilator stress.There is a medium sized moderate intensity fixed defect of the inferolateral wall consistent with a site of prior non-transmural MI without ischemia. Gated imaging shows inferolateral hypokinesis with low normal LV function with an ejection fraction of  52%   Neuro/Psych CVA negative psych ROS   GI/Hepatic Neg liver ROS, hiatal hernia, GERD  ,  Endo/Other  negative endocrine ROS  Renal/GU negative Renal ROS  negative genitourinary   Musculoskeletal  (+) Arthritis ,   Abdominal   Peds  Hematology negative hematology ROS (+)   Anesthesia Other Findings   Reproductive/Obstetrics                           Anesthesia Physical Anesthesia Plan  ASA: III  Anesthesia Plan: General and Regional   Post-op Pain Management:  Regional for Post-op pain   Induction: Intravenous  PONV Risk Score and Plan: 2 and Ondansetron and Dexamethasone  Airway Management Planned: Oral ETT  Additional Equipment:   Intra-op Plan:   Post-operative Plan: Extubation in OR  Informed Consent: I have reviewed the patients History and Physical, chart, labs and discussed the procedure including the risks, benefits and alternatives for the proposed anesthesia with the patient or authorized representative who has indicated his/her understanding and acceptance.   Dental advisory given  Plan Discussed with: CRNA  Anesthesia Plan Comments: ( )       Anesthesia Quick Evaluation

## 2017-12-08 ENCOUNTER — Telehealth (INDEPENDENT_AMBULATORY_CARE_PROVIDER_SITE_OTHER): Payer: Self-pay | Admitting: Orthopedic Surgery

## 2017-12-08 NOTE — Telephone Encounter (Signed)
Please advise. Thanks.  

## 2017-12-08 NOTE — Telephone Encounter (Signed)
Patient left message on voice mail wanting to know when to stop his 81 mg Aspirin.  Patient has already been given instructions to stop Eliquis 3 days prior to surgery.   Surgery date is Tuesday, November 26th.

## 2017-12-08 NOTE — Telephone Encounter (Signed)
5 days before.

## 2017-12-09 NOTE — Telephone Encounter (Signed)
I tried calling to advise. No answer. LMVM advising per Dr Marlou Sa.

## 2017-12-12 NOTE — H&P (Addendum)
[name] [name] is an 84 y.o. male.   Chief Complaint: Left shoulder pain HPI: [name] [name] is a patient with  shoulder pain.  He has significant functional limitation with the right arm.  He has failed conservative management and presents for operative management after explanation of risks and benefits.  He has rotator cuff arthropathy by CT scanning.  He lives with his daughter who moved in in November and his wife who has breast cancer.  Past Medical History:  Diagnosis Date  . Arthritis    hands  . Cancer ([place])    colon cancer 1984  . Cataract   . CHF (congestive heart failure) (HCC)    takes Lasix daily  . Coronary artery disease 08/10/2011  . Dermatochalasis   . Diverticulosis   . Dyslipidemia    takes Niacin daily  . GERD (gastroesophageal reflux disease)    sometimes d/t food  . H/O hiatal hernia   . History of colon polyps   . History of kidney stones   . History of seasonal allergies    takes OTC allergy meds prn  . Hx: recurrent pneumonia   . Hypercholesterolemia   . MR (mitral regurgitation)   . MVP (mitral valve prolapse)   . Pneumonia    early July 2013  . Pseudophakia   . PVC (premature ventricular contraction)   . S/P CABG x 2 08/19/2011   LIMA to LAD, SVG to RCA, EVH via left thigh  . S/P mitral valve repair 08/19/2011   Complex valvuloplasty including triangular resection of posterior leaflet, artificial Goretex neocord placement x6 and 42mm Sorin Memo 3D ring annuloplasty  . Shortness of breath    with exertion/lying/sitting  . Systolic murmur   . Thyroid nodule 08/18/2011   3 cm nodule discovered on chest CT scan  . Urinary frequency   . Urinary urgency     Past Surgical History:  Procedure Laterality Date  . BUNIONECTOMY    . CARDIAC CATHETERIZATION  08/10/11  . CATARACT EXTRACTION     bilateral  . COLONOSCOPY    . CORONARY ARTERY BYPASS GRAFT  08/19/2011   Procedure: CORONARY ARTERY BYPASS GRAFTING (CABG);  Surgeon: Rexene Alberts, MD;  Location: [facility];   Service: Open Heart Surgery;  Laterality: N/A;  Coronary Artery Bypass Grafting times two using left internal mammary artery and left greater saphenous vein endoscopiclly harvested  . CYSTOSCOPY     with manipulation of Ureteral Calculus  . ESOPHAGOGASTRODUODENOSCOPY     with dilitation  . HERNIA REPAIR    . LITHOTRIPSY     Whole body extracorporeal shock wave  . LITHOTRIPSY     Left ESL PUA GPE 01/03/2006,04/03/2006  . MITRAL VALVE REPAIR  08/19/2011   Procedure: MITRAL VALVE REPAIR (MVR);  Surgeon: Rexene Alberts, MD;  Location: [facility];  Service: Open Heart Surgery;  Laterality: N/A;  . partial colecotomy  1984    Family History  Problem Relation Age of Onset  . Cancer Mother   . Heart disease Father   . Glaucoma Sister   . Prostate cancer Other        Fraternal HX    Social History:  reports that he has quit smoking. His smoking use included cigarettes. He has never used smokeless tobacco. He reports that he does not drink alcohol or use drugs.  Allergies:  Allergies  Allergen Reactions  . Ivp Dye [Iodinated Diagnostic Agents] Rash    No medications prior to admission.    No results  found for this or any previous visit (from the past 48 hour(s)). No results found.  Review of Systems  Musculoskeletal: Positive for joint pain.  All other systems reviewed and are negative.   There were no vitals taken for this visit. Physical Exam  Constitutional: He appears well-developed.  HENT:  Head: Normocephalic.  Eyes: Pupils are equal, round, and reactive to light.  Neck: Normal range of motion.  Cardiovascular: Normal rate.  Respiratory: Effort normal.  Neurological: He is alert.  Skin: Skin is warm.  Psychiatric: He has a normal mood and affect.  Examination of the  shoulder demonstrates functional deltoid with about 60 degrees of forward flexion and abduction.  Rotator cuff weakness is present.  Radial pulses intact.  No masses lymphadenopathy or skin changes noted in  that right arm region.  Assessment/Plan Impression is rotator cuff arthropathy with pain and functional limitation.  Patient has had failure of conservative treatment.  Plan is reverse shoulder replacement.  Risk and benefits are discussed including but not limited to infection nerve vessel damage incomplete restoration of function and potential need for revision.  Patient understands the risk benefits and wishes to proceed.  All questions were answered.  [name] Malta, MD 12/12/2017, 10:55 PM

## 2017-12-13 ENCOUNTER — Encounter (HOSPITAL_COMMUNITY): Payer: Self-pay | Admitting: *Deleted

## 2017-12-13 ENCOUNTER — Inpatient Hospital Stay (HOSPITAL_COMMUNITY): Payer: Medicare Other | Admitting: Physician Assistant

## 2017-12-13 ENCOUNTER — Other Ambulatory Visit: Payer: Self-pay

## 2017-12-13 ENCOUNTER — Encounter (HOSPITAL_COMMUNITY)
Admission: RE | Disposition: A | Discharge status: Home Health | Payer: Self-pay | Source: Home / Self Care | Attending: Orthopedic Surgery

## 2017-12-13 ENCOUNTER — Inpatient Hospital Stay (HOSPITAL_COMMUNITY): Payer: Medicare Other | Admitting: Certified Registered Nurse Anesthetist

## 2017-12-13 ENCOUNTER — Inpatient Hospital Stay (HOSPITAL_COMMUNITY)
Admission: RE | Admit: 2017-12-13 | Discharge: 2017-12-14 | DRG: 483 | Disposition: A | Discharge status: Home Health | Payer: Medicare Other | Attending: Orthopedic Surgery | Admitting: Orthopedic Surgery

## 2017-12-13 ENCOUNTER — Inpatient Hospital Stay (HOSPITAL_COMMUNITY): Payer: Medicare Other

## 2017-12-13 DIAGNOSIS — Z8042 Family history of malignant neoplasm of prostate: Secondary | ICD-10-CM

## 2017-12-13 DIAGNOSIS — Z8249 Family history of ischemic heart disease and other diseases of the circulatory system: Secondary | ICD-10-CM | POA: Diagnosis not present

## 2017-12-13 DIAGNOSIS — Z87891 Personal history of nicotine dependence: Secondary | ICD-10-CM

## 2017-12-13 DIAGNOSIS — Z8701 Personal history of pneumonia (recurrent): Secondary | ICD-10-CM

## 2017-12-13 DIAGNOSIS — I509 Heart failure, unspecified: Secondary | ICD-10-CM | POA: Diagnosis present

## 2017-12-13 DIAGNOSIS — Z9049 Acquired absence of other specified parts of digestive tract: Secondary | ICD-10-CM | POA: Diagnosis not present

## 2017-12-13 DIAGNOSIS — Z8679 Personal history of other diseases of the circulatory system: Secondary | ICD-10-CM

## 2017-12-13 DIAGNOSIS — Z9841 Cataract extraction status, right eye: Secondary | ICD-10-CM

## 2017-12-13 DIAGNOSIS — M12812 Other specific arthropathies, not elsewhere classified, left shoulder: Secondary | ICD-10-CM

## 2017-12-13 DIAGNOSIS — I2510 Atherosclerotic heart disease of native coronary artery without angina pectoris: Secondary | ICD-10-CM | POA: Diagnosis present

## 2017-12-13 DIAGNOSIS — Z9842 Cataract extraction status, left eye: Secondary | ICD-10-CM | POA: Diagnosis not present

## 2017-12-13 DIAGNOSIS — M19012 Primary osteoarthritis, left shoulder: Secondary | ICD-10-CM | POA: Diagnosis present

## 2017-12-13 DIAGNOSIS — E785 Hyperlipidemia, unspecified: Secondary | ICD-10-CM | POA: Diagnosis present

## 2017-12-13 DIAGNOSIS — G8918 Other acute postprocedural pain: Secondary | ICD-10-CM | POA: Diagnosis not present

## 2017-12-13 DIAGNOSIS — E041 Nontoxic single thyroid nodule: Secondary | ICD-10-CM | POA: Diagnosis present

## 2017-12-13 DIAGNOSIS — Z85038 Personal history of other malignant neoplasm of large intestine: Secondary | ICD-10-CM

## 2017-12-13 DIAGNOSIS — Z91041 Radiographic dye allergy status: Secondary | ICD-10-CM

## 2017-12-13 DIAGNOSIS — Z86010 Personal history of colonic polyps: Secondary | ICD-10-CM | POA: Diagnosis not present

## 2017-12-13 DIAGNOSIS — Z951 Presence of aortocoronary bypass graft: Secondary | ICD-10-CM

## 2017-12-13 DIAGNOSIS — M19019 Primary osteoarthritis, unspecified shoulder: Secondary | ICD-10-CM | POA: Diagnosis present

## 2017-12-13 DIAGNOSIS — Z83511 Family history of glaucoma: Secondary | ICD-10-CM | POA: Diagnosis not present

## 2017-12-13 DIAGNOSIS — Z87442 Personal history of urinary calculi: Secondary | ICD-10-CM | POA: Diagnosis not present

## 2017-12-13 DIAGNOSIS — I340 Nonrheumatic mitral (valve) insufficiency: Secondary | ICD-10-CM | POA: Diagnosis not present

## 2017-12-13 HISTORY — PX: REVERSE SHOULDER ARTHROPLASTY: SHX5054

## 2017-12-13 SURGERY — ARTHROPLASTY, SHOULDER, TOTAL, REVERSE
Anesthesia: Regional | Site: Shoulder | Laterality: Left

## 2017-12-13 MED ORDER — LACTATED RINGERS IV SOLN: INTRAVENOUS | Status: AC

## 2017-12-13 MED ORDER — METHOCARBAMOL 500 MG PO TABS
500.0000 mg | ORAL_TABLET | Freq: Four times a day (QID) | ORAL | Status: DC | PRN
  Administered 2017-12-14 (×2): 500 mg via ORAL
  Filled 2017-12-13 (×2): qty 1

## 2017-12-13 MED ORDER — VANCOMYCIN HCL 1000 MG IV SOLR
INTRAVENOUS | Status: AC
  Filled 2017-12-13: qty 1000

## 2017-12-13 MED ORDER — CEFAZOLIN SODIUM-DEXTROSE 1-4 GM/50ML-% IV SOLN
1.0000 g | Freq: Four times a day (QID) | INTRAVENOUS | Status: AC
  Administered 2017-12-13 (×2): 1 g via INTRAVENOUS
  Filled 2017-12-13 (×2): qty 50

## 2017-12-13 MED ORDER — POTASSIUM CHLORIDE CRYS ER 20 MEQ PO TBCR
20.0000 meq | EXTENDED_RELEASE_TABLET | Freq: Two times a day (BID) | ORAL | Status: DC
  Administered 2017-12-13 – 2017-12-14 (×3): 20 meq via ORAL
  Filled 2017-12-13 (×3): qty 1

## 2017-12-13 MED ORDER — FENTANYL CITRATE (PF) 250 MCG/5ML IJ SOLN
INTRAMUSCULAR | Status: DC | PRN
  Administered 2017-12-13: 50 ug via INTRAVENOUS
  Administered 2017-12-13: 25 ug via INTRAVENOUS

## 2017-12-13 MED ORDER — CHLORHEXIDINE GLUCONATE 4 % EX LIQD: 60.0000 mL | Freq: Once | CUTANEOUS | Status: DC

## 2017-12-13 MED ORDER — SUGAMMADEX SODIUM 200 MG/2ML IV SOLN
INTRAVENOUS | Status: DC | PRN
  Administered 2017-12-13: 160 mg via INTRAVENOUS

## 2017-12-13 MED ORDER — MORPHINE SULFATE (PF) 2 MG/ML IV SOLN: 0.5000 mg | INTRAVENOUS | Status: DC | PRN

## 2017-12-13 MED ORDER — PROPOFOL 10 MG/ML IV BOLUS
INTRAVENOUS | Status: AC
  Filled 2017-12-13: qty 20

## 2017-12-13 MED ORDER — ONDANSETRON HCL 4 MG/2ML IJ SOLN: 4.0000 mg | Freq: Four times a day (QID) | INTRAMUSCULAR | Status: DC | PRN

## 2017-12-13 MED ORDER — PROPOFOL 10 MG/ML IV BOLUS
INTRAVENOUS | Status: DC | PRN
  Administered 2017-12-13: 140 mg via INTRAVENOUS
  Administered 2017-12-13: 40 mg via INTRAVENOUS

## 2017-12-13 MED ORDER — METOCLOPRAMIDE HCL 5 MG/ML IJ SOLN: 5.0000 mg | Freq: Three times a day (TID) | INTRAMUSCULAR | Status: DC | PRN

## 2017-12-13 MED ORDER — BUPIVACAINE HCL (PF) 0.5 % IJ SOLN
INTRAMUSCULAR | Status: DC | PRN
  Administered 2017-12-13: 20 mL via PERINEURAL

## 2017-12-13 MED ORDER — FENTANYL CITRATE (PF) 250 MCG/5ML IJ SOLN
INTRAMUSCULAR | Status: AC
  Filled 2017-12-13: qty 5

## 2017-12-13 MED ORDER — MENTHOL 3 MG MT LOZG: 1.0000 | LOZENGE | OROMUCOSAL | Status: DC | PRN

## 2017-12-13 MED ORDER — VANCOMYCIN HCL 1000 MG IV SOLR
INTRAVENOUS | Status: DC | PRN
  Administered 2017-12-13: 1000 mg via TOPICAL

## 2017-12-13 MED ORDER — FUROSEMIDE 20 MG PO TABS
20.0000 mg | ORAL_TABLET | Freq: Every day | ORAL | Status: DC
  Administered 2017-12-14: 20 mg via ORAL
  Filled 2017-12-13: qty 1

## 2017-12-13 MED ORDER — SODIUM CHLORIDE 0.9 % IV SOLN
INTRAVENOUS | Status: DC | PRN
  Administered 2017-12-13: 20 ug/min via INTRAVENOUS

## 2017-12-13 MED ORDER — ACETAMINOPHEN 325 MG PO TABS: 325.0000 mg | ORAL_TABLET | Freq: Four times a day (QID) | ORAL | Status: DC | PRN

## 2017-12-13 MED ORDER — METHOCARBAMOL 1000 MG/10ML IJ SOLN
500.0000 mg | Freq: Four times a day (QID) | INTRAVENOUS | Status: DC | PRN
  Filled 2017-12-13: qty 5

## 2017-12-13 MED ORDER — HYPROMELLOSE (GONIOSCOPIC) 2.5 % OP SOLN: 1.0000 [drp] | Freq: Every day | OPHTHALMIC | Status: DC | PRN

## 2017-12-13 MED ORDER — LIDOCAINE 2% (20 MG/ML) 5 ML SYRINGE
INTRAMUSCULAR | Status: DC | PRN
  Administered 2017-12-13: 60 mg via INTRAVENOUS

## 2017-12-13 MED ORDER — APIXABAN 5 MG PO TABS
5.0000 mg | ORAL_TABLET | Freq: Two times a day (BID) | ORAL | Status: DC
  Administered 2017-12-14: 5 mg via ORAL
  Filled 2017-12-13 (×2): qty 1

## 2017-12-13 MED ORDER — DEXAMETHASONE SODIUM PHOSPHATE 10 MG/ML IJ SOLN
INTRAMUSCULAR | Status: DC | PRN
  Administered 2017-12-13: 4 mg via INTRAVENOUS

## 2017-12-13 MED ORDER — SIMVASTATIN 20 MG PO TABS
20.0000 mg | ORAL_TABLET | Freq: Every day | ORAL | Status: DC
  Administered 2017-12-13: 20 mg via ORAL
  Filled 2017-12-13: qty 1

## 2017-12-13 MED ORDER — HYDROCODONE-ACETAMINOPHEN 5-325 MG PO TABS
1.0000 | ORAL_TABLET | ORAL | Status: DC | PRN
  Administered 2017-12-14: 1 via ORAL
  Administered 2017-12-14: 2 via ORAL
  Filled 2017-12-13: qty 2
  Filled 2017-12-13: qty 1

## 2017-12-13 MED ORDER — LACTATED RINGERS IV SOLN
INTRAVENOUS | Status: DC | PRN
  Administered 2017-12-13: 07:00:00 via INTRAVENOUS

## 2017-12-13 MED ORDER — LIDOCAINE 2% (20 MG/ML) 5 ML SYRINGE
INTRAMUSCULAR | Status: AC
  Filled 2017-12-13: qty 5

## 2017-12-13 MED ORDER — ADULT MULTIVITAMIN W/MINERALS CH
1.0000 | ORAL_TABLET | Freq: Every day | ORAL | Status: DC
  Administered 2017-12-13 – 2017-12-14 (×2): 1 via ORAL
  Filled 2017-12-13 (×2): qty 1

## 2017-12-13 MED ORDER — CEFAZOLIN SODIUM-DEXTROSE 2-4 GM/100ML-% IV SOLN
2.0000 g | INTRAVENOUS | Status: AC
  Administered 2017-12-13: 2 g via INTRAVENOUS
  Filled 2017-12-13: qty 100

## 2017-12-13 MED ORDER — 0.9 % SODIUM CHLORIDE (POUR BTL) OPTIME
TOPICAL | Status: DC | PRN
  Administered 2017-12-13: 5000 mL

## 2017-12-13 MED ORDER — ONDANSETRON HCL 4 MG PO TABS: 4.0000 mg | ORAL_TABLET | Freq: Four times a day (QID) | ORAL | Status: DC | PRN

## 2017-12-13 MED ORDER — BUPIVACAINE LIPOSOME 1.3 % IJ SUSP
INTRAMUSCULAR | Status: DC | PRN
  Administered 2017-12-13: 10 mL via PERINEURAL

## 2017-12-13 MED ORDER — PHENOL 1.4 % MT LIQD: 1.0000 | OROMUCOSAL | Status: DC | PRN

## 2017-12-13 MED ORDER — POLYVINYL ALCOHOL 1.4 % OP SOLN
1.0000 [drp] | Freq: Every day | OPHTHALMIC | Status: DC | PRN
  Filled 2017-12-13: qty 15

## 2017-12-13 MED ORDER — ROCURONIUM BROMIDE 50 MG/5ML IV SOSY
PREFILLED_SYRINGE | INTRAVENOUS | Status: AC
  Filled 2017-12-13: qty 5

## 2017-12-13 MED ORDER — METOCLOPRAMIDE HCL 5 MG PO TABS: 5.0000 mg | ORAL_TABLET | Freq: Three times a day (TID) | ORAL | Status: DC | PRN

## 2017-12-13 MED ORDER — DOCUSATE SODIUM 100 MG PO CAPS
100.0000 mg | ORAL_CAPSULE | Freq: Two times a day (BID) | ORAL | Status: DC
  Administered 2017-12-13 – 2017-12-14 (×3): 100 mg via ORAL
  Filled 2017-12-13 (×3): qty 1

## 2017-12-13 MED ORDER — GABAPENTIN 300 MG PO CAPS
300.0000 mg | ORAL_CAPSULE | Freq: Three times a day (TID) | ORAL | Status: DC
  Administered 2017-12-13 – 2017-12-14 (×3): 300 mg via ORAL
  Filled 2017-12-13 (×3): qty 1

## 2017-12-13 MED ORDER — TRANEXAMIC ACID 1000 MG/10ML IV SOLN
2000.0000 mg | INTRAVENOUS | Status: DC
  Filled 2017-12-13: qty 20

## 2017-12-13 MED ORDER — ONDANSETRON HCL 4 MG/2ML IJ SOLN
INTRAMUSCULAR | Status: DC | PRN
  Administered 2017-12-13: 4 mg via INTRAVENOUS

## 2017-12-13 MED ORDER — FENTANYL CITRATE (PF) 100 MCG/2ML IJ SOLN: 25.0000 ug | INTRAMUSCULAR | Status: DC | PRN

## 2017-12-13 MED ORDER — ROCURONIUM BROMIDE 10 MG/ML (PF) SYRINGE
PREFILLED_SYRINGE | INTRAVENOUS | Status: DC | PRN
  Administered 2017-12-13: 50 mg via INTRAVENOUS

## 2017-12-13 MED ORDER — ASPIRIN EC 81 MG PO TBEC: 81.0000 mg | DELAYED_RELEASE_TABLET | Freq: Every evening | ORAL | Status: DC

## 2017-12-13 SURGICAL SUPPLY — 72 items
ALCOHOL 70% 16 OZ (MISCELLANEOUS) ×3 IMPLANT
BASEPLATE GLENOSPHERE 25 (Plate) ×2 IMPLANT
BASEPLATE GLENOSPHERE 25MM (Plate) ×1 IMPLANT
BIT DRILL TWIST 2.7 (BIT) ×2 IMPLANT
BIT DRILL TWIST 2.7MM (BIT) ×1
BLADE SAW SGTL 13X75X1.27 (BLADE) ×3 IMPLANT
CHLORAPREP W/TINT 26ML (MISCELLANEOUS) ×3 IMPLANT
CLOSURE WOUND 1/2 X4 (GAUZE/BANDAGES/DRESSINGS) ×1
COVER SURGICAL LIGHT HANDLE (MISCELLANEOUS) ×3 IMPLANT
COVER WAND RF STERILE (DRAPES) ×3 IMPLANT
DRAPE INCISE IOBAN 66X45 STRL (DRAPES) ×3 IMPLANT
DRAPE U-SHAPE 47X51 STRL (DRAPES) ×6 IMPLANT
DRSG AQUACEL AG ADV 3.5X10 (GAUZE/BANDAGES/DRESSINGS) ×3 IMPLANT
ELECT BLADE 4.0 EZ CLEAN MEGAD (MISCELLANEOUS) ×3
ELECT REM PT RETURN 9FT ADLT (ELECTROSURGICAL) ×3
ELECTRODE BLDE 4.0 EZ CLN MEGD (MISCELLANEOUS) ×1 IMPLANT
ELECTRODE REM PT RTRN 9FT ADLT (ELECTROSURGICAL) ×1 IMPLANT
GAUZE SPONGE 4X4 12PLY STRL LF (GAUZE/BANDAGES/DRESSINGS) ×3 IMPLANT
GLENOID SPHERE 36MM CVD +3 (Orthopedic Implant) ×3 IMPLANT
GLOVE BIOGEL PI IND STRL 7.5 (GLOVE) ×1 IMPLANT
GLOVE BIOGEL PI IND STRL 8 (GLOVE) ×1 IMPLANT
GLOVE BIOGEL PI INDICATOR 7.5 (GLOVE) ×2
GLOVE BIOGEL PI INDICATOR 8 (GLOVE) ×2
GLOVE ECLIPSE 7.0 STRL STRAW (GLOVE) ×3 IMPLANT
GLOVE ECLIPSE 8.0 STRL XLNG CF (GLOVE) ×12 IMPLANT
GLOVE SURG ORTHO 8.0 STRL STRW (GLOVE) ×3 IMPLANT
GOWN STRL REUS W/ TWL LRG LVL3 (GOWN DISPOSABLE) ×2 IMPLANT
GOWN STRL REUS W/ TWL XL LVL3 (GOWN DISPOSABLE) ×1 IMPLANT
GOWN STRL REUS W/TWL LRG LVL3 (GOWN DISPOSABLE) ×4
GOWN STRL REUS W/TWL XL LVL3 (GOWN DISPOSABLE) ×2
HYDROGEN PEROXIDE 16OZ (MISCELLANEOUS) ×3 IMPLANT
KIT BASIN OR (CUSTOM PROCEDURE TRAY) ×3 IMPLANT
KIT TURNOVER KIT B (KITS) ×3 IMPLANT
LOOP VESSEL MAXI BLUE (MISCELLANEOUS) ×3 IMPLANT
MANIFOLD NEPTUNE II (INSTRUMENTS) ×3 IMPLANT
MODEL GLENOID TOTAL SIGNATURE (SYSTAGENIX WOUND MANAGEMENT) ×3 IMPLANT
NDL SUT 6 .5 CRC .975X.05 MAYO (NEEDLE) ×1 IMPLANT
NEEDLE MAYO TAPER (NEEDLE) ×2
NS IRRIG 1000ML POUR BTL (IV SOLUTION) ×15 IMPLANT
PACK SHOULDER (CUSTOM PROCEDURE TRAY) ×3 IMPLANT
PAD ARMBOARD 7.5X6 YLW CONV (MISCELLANEOUS) ×6 IMPLANT
PIN STEINMANN THREADED TIP (PIN) ×3 IMPLANT
PIN THREADED REVERSE (PIN) ×3 IMPLANT
RETRIEVER SUT HEWSON (MISCELLANEOUS) ×3 IMPLANT
SCREW BONE LOCKING 4.75X30X3.5 (Screw) ×6 IMPLANT
SCREW BONE STRL 6.5MMX25MM (Screw) ×3 IMPLANT
SCREW LOCKING 4.75MMX15MM (Screw) ×6 IMPLANT
SLING ARM IMMOBILIZER LRG (SOFTGOODS) ×6 IMPLANT
SOLUTION BETADINE 4OZ (MISCELLANEOUS) ×3 IMPLANT
SPONGE LAP 18X18 RF (DISPOSABLE) ×3 IMPLANT
SPONGE LAP 18X18 X RAY DECT (DISPOSABLE) ×3 IMPLANT
STEM HUMERAL STRL 12MMX83MM (Stem) ×3 IMPLANT
STRIP CLOSURE SKIN 1/2X4 (GAUZE/BANDAGES/DRESSINGS) ×2 IMPLANT
SUCTION FRAZIER HANDLE 10FR (MISCELLANEOUS) ×2
SUCTION TUBE FRAZIER 10FR DISP (MISCELLANEOUS) ×1 IMPLANT
SUT FIBERWIRE #2 38 T-5 BLUE (SUTURE) ×6
SUT MAXBRAID (SUTURE) ×6 IMPLANT
SUT MNCRL AB 3-0 PS2 18 (SUTURE) ×3 IMPLANT
SUT SILK 2 0 TIES 10X30 (SUTURE) ×3 IMPLANT
SUT VIC AB 0 CT1 27 (SUTURE) ×8
SUT VIC AB 0 CT1 27XBRD ANBCTR (SUTURE) ×4 IMPLANT
SUT VIC AB 1 CT1 27 (SUTURE) ×4
SUT VIC AB 1 CT1 27XBRD ANBCTR (SUTURE) ×2 IMPLANT
SUT VIC AB 2-0 CT1 27 (SUTURE) ×6
SUT VIC AB 2-0 CT1 TAPERPNT 27 (SUTURE) ×3 IMPLANT
SUT VICRYL 0 AB UR-6 (SUTURE) ×9 IMPLANT
SUTURE FIBERWR #2 38 T-5 BLUE (SUTURE) ×2 IMPLANT
TOWEL OR 17X26 10 PK STRL BLUE (TOWEL DISPOSABLE) ×3 IMPLANT
TRAY FOLEY BAG SILVER LF 16FR (CATHETERS) IMPLANT
TRAY HUM MINI SHOULDER +3 40 (Joint) ×3 IMPLANT
TRAY HUM REV SHOULDER 36 +3 (Shoulder) ×3 IMPLANT
WATER STERILE IRR 1000ML POUR (IV SOLUTION) ×3 IMPLANT

## 2017-12-13 NOTE — Anesthesia Procedure Notes (Signed)
Procedure Name: Intubation Date/Time: 12/13/2017 8:37 AM Performed by: Bryson Corona, CRNA Pre-anesthesia Checklist: Patient identified, Emergency Drugs available, Suction available and Patient being monitored Patient Re-evaluated:Patient Re-evaluated prior to induction Oxygen Delivery Method: Circle System Utilized Preoxygenation: Pre-oxygenation with 100% oxygen Induction Type: IV induction Ventilation: Mask ventilation without difficulty Laryngoscope Size: Mac Grade View: Grade I Tube type: Oral Number of attempts: 1 Airway Equipment and Method: Stylet and Oral airway Placement Confirmation: ETT inserted through vocal cords under direct vision,  positive ETCO2 and breath sounds checked- equal and bilateral Secured at: 23 cm Tube secured with: Tape Dental Injury: Teeth and Oropharynx as per pre-operative assessment  Comments: Inserted by Prescilla Sours, SRNA

## 2017-12-13 NOTE — Brief Op Note (Signed)
12/13/2017  10:18 AM  PATIENT:  [name] [name]  84 y.o. male  PRE-OPERATIVE DIAGNOSIS:  left shouder rotation with arthropathy  POST-OPERATIVE DIAGNOSIS:  left shouder rotation with arthropathy  PROCEDURE:  Procedure(s): LEFT REVERSE SHOULDER ARTHROPLASTY  SURGEON:  Surgeon(s): Marlou Sa, Tonna Corner, MD  ASSISTANT:C Modena Slater rnfa  ANESTHESIA:   general  EBL: 100 ml    Total I/O In: 500 [I.V.:500] Out: 200 [Blood:200]  BLOOD ADMINISTERED: none  DRAINS: none   LOCAL MEDICATIONS USED:  none  SPECIMEN:  No Specimen  COUNTS:  YES  TOURNIQUET:  * No tourniquets in log *  DICTATION: .Other Dictation: Dictation Number [id number]  PLAN OF CARE: Admit for overnight observation  PATIENT DISPOSITION:  PACU - hemodynamically stable

## 2017-12-13 NOTE — Anesthesia Procedure Notes (Signed)
Anesthesia Regional Block: Interscalene brachial plexus block   Pre-Anesthetic Checklist: ,, timeout performed, Correct Patient, Correct Site, Correct Laterality, Correct Procedure, Correct Position, site marked, Risks and benefits discussed,  Surgical consent,  Pre-op evaluation,  At surgeon's request and post-op pain management  Laterality: Left  Prep: Maximum Sterile Barrier Precautions used, chloraprep       Needles:  Injection technique: Single-shot  Needle Type: Echogenic Stimulator Needle     Needle Length: 4cm  Needle Gauge: 22     Additional Needles:   Procedures:,,,, ultrasound used (permanent image in chart),,,,  Narrative:  Start time: 12/13/2017 7:07 AM End time: 12/13/2017 7:17 AM Injection made incrementally with aspirations every 5 mL.  Performed by: Personally  Anesthesiologist: Freddrick March, MD  Additional Notes: Monitors applied. No increased pain on injection. No increased resistance to injection. Injection made in 5cc increments. Good needle visualization. Patient tolerated procedure well.

## 2017-12-13 NOTE — Interval H&P Note (Signed)
History and Physical Interval Note:  12/13/2017 7:17 AM  [name] [name]  has presented today for surgery, with the diagnosis of left shouder rotation with arthropathy  The various methods of treatment have been discussed with the patient and family. After consideration of risks, benefits and other options for treatment, the patient has consented to  Procedure(s): LEFT REVERSE SHOULDER ARTHROPLASTY (Left) as a surgical intervention .  The patient's history has been reviewed, patient examined, no change in status, stable for surgery.  I have reviewed the patient's chart and labs.  Questions were answered to the patient's satisfaction.     [name]

## 2017-12-13 NOTE — Interval H&P Note (Signed)
History and Physical Interval Note:  12/13/2017 7:18 AM  [name] [name]  has presented today for surgery, with the diagnosis of left shouder rotation with arthropathy  The various methods of treatment have been discussed with the patient and family. After consideration of risks, benefits and other options for treatment, the patient has consented to  Procedure(s): LEFT REVERSE SHOULDER ARTHROPLASTY (Left) as a surgical intervention .  The patient's history has been reviewed, patient examined, no change in status, stable for surgery.  I have reviewed the patient's chart and labs.  Questions were answered to the patient's satisfaction.     [name]

## 2017-12-13 NOTE — Op Note (Signed)
NAME: [name] [name], [name] [name] MEDICAL RECORD UY:[id number] ACCOUNT [id number] DATE OF BIRTH:May 14, 1929 FACILITY: [facility] LOCATION: [facility] PHYSICIAN:GREGORY Randel Pigg, MD  OPERATIVE REPORT  DATE OF PROCEDURE:  12/13/2017  PREOPERATIVE DIAGNOSIS:  Left shoulder rotator cuff arthropathy.  POSTOPERATIVE DIAGNOSIS:  Left shoulder rotator cuff arthropathy.  PROCEDURE:  Left shoulder reverse shoulder replacement utilizing Biomet components, glenosphere mini baseplate, 25 mm with 25 mm central screw, two 30 mm peripheral locking screws and two 25 mm peripheral locking screws along with size 12 mini humeral  stem with 36+3 mm glenosphere and mini humeral tray standard thickness 40 mm diameter +3 mm taper offset with a +3 highly cross-linked polyethylene bearing 36 mm diameter.  SURGEON:  Meredith Pel, MD.  ASSISTANT:  Laure Kidney  INDICATIONS:  [name] [name] is an 84 year old patient with left shoulder rotator cuff arthropathy who presents for operative management after explanation of risks and benefits.  PROCEDURE IN DETAIL:  The patient was brought to the operating room where general anesthetic was induced.  Preoperative antibiotics administered.  Timeout was called.  Left shoulder was prescrubbed with hydrogen peroxide, then alcohol and Betadine, which  was allowed to air dry, then prepped with ChloraPrep solution and draped in a sterile manner.  [name] was used to cover the operative field.  Timeout was called.  The patient's head was in neutral position.  A deltopectoral approach was made.  Cephalic  vein mobilized medially and protected.  The patient had significant rotator cuff arthropathy.  The circumflex vessels were ligated.  Axillary nerve was visualized and protected and a vessel loop was placed around it, and it was protected at all times  during the case.  The capsule was released from the 4 o'clock position to the 7 o'clock position on the humeral head.  This gave good exposure of the humeral  head.  Humeral head was then cut in about 30 degrees of retroversion, cut down to 1 mm above the  rotator cuff insertion.  Broaching was then performed up to a size 13 for a 12 mini stem.  Very good fit was obtained.  Cap was placed.  At this time, anterior retractor was placed and the glenoid was exposed.  A 360 degree capsular release was  performed with attention directed towards avoiding injury to the axillary nerve.  At this time, anterior retractor was placed.  The patient specific instrumentation was utilized on the glenoid and the guide pin was placed.  This gave a very nice  centralized pin in the glenoid vault.  Reaming was performed.  No augmentation was necessary.  At this time, thorough irrigation was performed and the baseplate was placed and 4 peripheral locking screws were placed as well as a central screw.  Good  purchase obtained.  The +3 glenosphere was then placed with good seating obtained.  At this time, attention was directed towards the humerus.  The true stem was placed with good fixation achieved.  This was a 12 mm humeral stem.  Subscap repair was not  possible.  Trial reduction with the mini humeral tray standard thickness +3 taper offset along with a and +3 cross-linked poly bearing gave excellent stability.  The +3 bearing gave slightly improved stability.  This basically allowed full forward  flexion and abduction as well as internal and external rotation.  At this time, the trial component was removed.  Thorough irrigation performed and the true component placed with same stability parameters maintained.  There was no instability extension,  adduction and forward flexion force.  At this time, thorough irrigation was performed and vancomycin was placed into the joint.  The deltopectoral interval was then closed using #1 Vicryl suture followed by interrupted inverted 0 Vicryl suture, 2-0  Vicryl suture and 3-0 Monocryl.  Aquacel dressing placed.  The patient was placed in a  shoulder immobilizer.  He tolerated the procedure well without immediate complications.  TN/NUANCE  D:12/13/2017 T:12/13/2017 JOB:[id number]

## 2017-12-13 NOTE — Transfer of Care (Signed)
Immediate Anesthesia Transfer of Care Note  Patient: [name] [name]  Procedure(s) Performed: LEFT REVERSE SHOULDER ARTHROPLASTY (Left Shoulder)  Patient Location: PACU  Anesthesia Type:GA combined with regional for post-op pain  Level of Consciousness: drowsy, patient cooperative and responds to stimulation  Airway & Oxygen Therapy: Patient Spontanous Breathing and Patient connected to face mask oxygen  Post-op Assessment: Report given to RN, Post -op Vital signs reviewed and stable and Patient moving all extremities X 4  Post vital signs: Reviewed  Last Vitals:  Vitals Value Taken Time  BP 120/62 12/13/2017 10:22 AM  Temp    Pulse 61 12/13/2017 10:22 AM  Resp 15 12/13/2017 10:22 AM  SpO2 100 % 12/13/2017 10:22 AM  Vitals shown include unvalidated device data.  Last Pain:  Vitals:   12/13/17 0550  TempSrc: Oral         Complications: No apparent anesthesia complications

## 2017-12-14 ENCOUNTER — Encounter (HOSPITAL_COMMUNITY): Payer: Self-pay | Admitting: Orthopedic Surgery

## 2017-12-14 MED ORDER — HYDROCODONE-ACETAMINOPHEN 5-325 MG PO TABS: 1.0000 | ORAL_TABLET | ORAL | 0 refills | Status: DC | PRN

## 2017-12-14 MED ORDER — METHOCARBAMOL 500 MG PO TABS: 500.0000 mg | ORAL_TABLET | Freq: Three times a day (TID) | ORAL | 0 refills | Status: DC | PRN

## 2017-12-14 NOTE — Evaluation (Signed)
Occupational Therapy Evaluation Patient Details Name: [name] [name] MRN: [id number] DOB: 16-Oct-1929 Today's Date: 12/14/2017    History of Present Illness Pt is an 84 y/o male who presents s/p L reverse shoulder arthroplasty on 12/13/17. PMH significant for thyroid nodule, MVP s/p mitral valve repair, CABG x2, PVC's, hiatal hernia s/p repair, CAD, CHF, CA, cataracts s/p extraction.   Clinical Impression   PTA, pt was living with his wife and daughter and was independent with ADLs and light IADLs. Currently, pt requires Mod-Max A for UB ADLs, Min A for LB ADLs, and functional mobility using RW. Provided education and handout on shoulder precautions, exercises, sleep positions, sling management/positioning, UB ADLs, LB ADLs, and functional transfer with SPC. Pt participating in LUE exercises at EOB; nerve block preventing AROM of elbow. Pt would benefit from further acute OT to facilitate safe dc. Recommend dc to home with HHOT for further OT to optimize safety, independence with ADLs, and return to PLOF.       Follow Up Recommendations  Home health OT;Supervision/Assistance - 24 hour    Equipment Recommendations  None recommended by OT    Recommendations for Other Services PT consult     Precautions / Restrictions Precautions Precautions: Fall;Shoulder Type of Shoulder Precautions: Active protocal. NWB. FF 0-90, AB 0-60, and ER 0-30.  Shoulder Interventions: Shoulder sling/immobilizer;At all times;Off for dressing/bathing/exercises Precaution Booklet Issued: Yes (comment) Precaution Comments: Reviewed all shoulder precautions, exercises, and compensatory techniques for ADLs Required Braces or Orthoses: Sling Restrictions Weight Bearing Restrictions: No      Mobility Bed Mobility Overal bed mobility: Needs Assistance Bed Mobility: Supine to Sit     Supine to sit: Min guard     General bed mobility comments: Min Guard A for safety as first time out of bed. Pt pt exiting  bed on right side  Transfers Overall transfer level: Needs assistance Equipment used: None Transfers: Sit to/from Stand Sit to Stand: Min guard         General transfer comment: Hands-on guarding for balance support and safety.     Balance Overall balance assessment: Needs assistance Sitting-balance support: Feet supported;No upper extremity supported Sitting balance-Leahy Scale: Fair     Standing balance support: No upper extremity supported;During functional activity Standing balance-Leahy Scale: Poor Standing balance comment: Requires assist                           ADL either performed or assessed with clinical judgement   ADL Overall ADL's : Needs assistance/impaired Eating/Feeding: Independent;Sitting   Grooming: Independent;Sitting   Upper Body Bathing: Minimal assistance;Sitting Upper Body Bathing Details (indicate cue type and reason): educating on compensatory techniques.  Lower Body Bathing: Min guard;Sit to/from stand   Upper Body Dressing : Moderate assistance;Maximal assistance;Sitting Upper Body Dressing Details (indicate cue type and reason): Pt requiring Mod A to don shirt and Max A for donning sling. Educating pt on compensatory techniques for donning T-shirts and button up. Providing pt with button aid. Providing pt with education on sling management and positioning.  Lower Body Dressing: Minimal assistance;Sit to/from stand   Toilet Transfer: Min guard;Ambulation         Tub/Shower Transfer Details (indicate cue type and reason): Discussed safe shower transfers. and sue of shower chair Functional mobility during ADLs: Minimal assistance;Cane General ADL Comments: Providing pt with education and handout on shoulder precautions, exercises, sling management/positioning, sleep positioning, UB ADLs, LB ADLs, and functional mobility. Pt demonstrating understanding.  Vision         Perception     Praxis      Pertinent Vitals/Pain  Pain Assessment: No/denies pain     Hand Dominance Right   Extremity/Trunk Assessment Upper Extremity Assessment Upper Extremity Assessment: LUE deficits/detail LUE Deficits / Details: s/p reverse total shoulder LUE: Unable to fully assess due to pain;Unable to fully assess due to immobilization LUE Coordination: decreased gross motor   Lower Extremity Assessment Lower Extremity Assessment: Defer to PT evaluation   Cervical / Trunk Assessment Cervical / Trunk Assessment: Kyphotic(Forward head posture with rounded shoulders)   Communication Communication Communication: No difficulties   Cognition Arousal/Alertness: Awake/alert Behavior During Therapy: Flat affect Overall Cognitive Status: Within Functional Limits for tasks assessed                                 General Comments: Pt not talkative, but feel this is baseline   General Comments  Reviewing all of handout for shoulder precautions.     Exercises Exercises: Shoulder Shoulder Exercises Shoulder Flexion: PROM;Self ROM;Left;10 reps;Seated Shoulder Extension: PROM;Self ROM;Left;10 reps;Seated Shoulder ABduction: PROM;Self ROM;Left;10 reps;Seated Shoulder External Rotation: PROM;Left;10 reps;Seated Elbow Flexion: PROM;Self ROM;Left;10 reps;Seated(Unable to perform AROM due to nerve block) Elbow Extension: PROM;Self ROM;Left;10 reps;Seated Wrist Flexion: AROM;Left;10 reps;Seated Wrist Extension: AROM;Left;10 reps;Seated Digit Composite Flexion: AROM;Left;10 reps;Seated Composite Extension: AROM;Left;10 reps;Seated   Shoulder Instructions Shoulder Instructions Donning/doffing shirt without moving shoulder: Moderate assistance Method for sponge bathing under operated UE: Minimal assistance Donning/doffing sling/immobilizer: Maximal assistance Correct positioning of sling/immobilizer: Maximal assistance ROM for elbow, wrist and digits of operated UE: Minimal assistance Sling wearing schedule (on at all  times/off for ADL's): Supervision/safety Proper positioning of operated UE when showering: Supervision/safety Positioning of UE while sleeping: Minimal assistance    Home Living Family/patient expects to be discharged to:: Private residence Living Arrangements: Spouse/significant other(Wife not able to assist) Available Help at Discharge: Family;Available 24 hours/day(Daughter plans on being present at dc) Type of Home: House Home Access: Stairs to enter;Ramped entrance     Home Layout: One level     Bathroom Shower/Tub: Occupational psychologist: Standard     Home Equipment: Environmental consultant - 2 wheels;Shower seat(rollator)          Prior Functioning/Environment Level of Independence: Independent        Comments: Pt was performing ADLs and IADLs as well as caring for his wife        OT Problem List: Decreased strength;Decreased range of motion;Decreased activity tolerance;Impaired balance (sitting and/or standing);Decreased knowledge of use of DME or AE;Decreased knowledge of precautions      OT Treatment/Interventions: Self-care/ADL training;Therapeutic exercise;Energy conservation;DME and/or AE instruction;Therapeutic activities;Patient/family education    OT Goals(Current goals can be found in the care plan section) Acute Rehab OT Goals Patient Stated Goal: Be able to care for his wife OT Goal Formulation: With patient Time For Goal Achievement: 12/28/17 Potential to Achieve Goals: Good  OT Frequency: Min 2X/week   Barriers to D/C:            Co-evaluation              AM-PAC OT "6 Clicks" Daily Activity     Outcome Measure Help from another person eating meals?: None Help from another person taking care of personal grooming?: A Little Help from another person toileting, which includes using toliet, bedpan, or urinal?: A Little Help from another person bathing (including  washing, rinsing, drying)?: A Little Help from another person to put on and  taking off regular upper body clothing?: A Lot Help from another person to put on and taking off regular lower body clothing?: A Little 6 Click Score: 18   End of Session Equipment Utilized During Treatment: Gait belt;Other (comment)(sling) Nurse Communication: Mobility status;Precautions;Weight bearing status  Activity Tolerance: Patient tolerated treatment well Patient left: in chair;with call bell/phone within reach  OT Visit Diagnosis: Other abnormalities of gait and mobility (R26.89);Unsteadiness on feet (R26.81);Muscle weakness (generalized) (M62.81)                Time: 4142-3953 OT Time Calculation (min): 48 min Charges:  OT General Charges $OT Visit: 1 Visit OT Evaluation $OT Eval Moderate Complexity: 1 Mod OT Treatments $Self Care/Home Management : 23-37 mins  Sladen Plancarte MSOT, OTR/L Acute Rehab Pager: [phone] Office: [place] 12/14/2017, 10:49 AM

## 2017-12-14 NOTE — Progress Notes (Signed)
Patient is discharged from room 3C11 at this time. Alert and in stable condition. IV site d/c'd and instructions read to patient and son with understanding verbalized. Left unit via wheelchair with all belongings at side.

## 2017-12-14 NOTE — Anesthesia Postprocedure Evaluation (Signed)
Anesthesia Post Note  Patient: [name] [name]  Procedure(s) Performed: LEFT REVERSE SHOULDER ARTHROPLASTY (Left Shoulder)     Patient location during evaluation: PACU Anesthesia Type: Regional Level of consciousness: awake and alert Pain management: pain level controlled Vital Signs Assessment: post-procedure vital signs reviewed and stable Respiratory status: spontaneous breathing, nonlabored ventilation, respiratory function stable and patient connected to nasal cannula oxygen Cardiovascular status: blood pressure returned to baseline and stable Postop Assessment: no apparent nausea or vomiting Anesthetic complications: no    Last Vitals:  Vitals:   12/14/17 0733 12/14/17 0734  BP: (!) 99/56   Pulse: 71   Resp:  16  Temp: 36.7 C   SpO2: 94%     Last Pain:  Vitals:   12/14/17 0733  TempSrc: Oral  PainSc:                  Jonerik Sliker L Markia Kyer

## 2017-12-14 NOTE — Progress Notes (Signed)
Subjective: Patient stable.  Pain controlled with Block still in effect   Objective: Vital signs in last 24 hours: Temp:  [97 F (36.1 C)-98.7 F (37.1 C)] 98 F (36.7 C) (11/27 0733) Pulse Rate:  [46-77] 71 (11/27 0733) Resp:  [10-20] 16 (11/27 0734) BP: (99-130)/(56-72) 99/56 (11/27 0733) SpO2:  [93 %-100 %] 94 % (11/27 0733)  Intake/Output from previous day: 11/26 0701 - 11/27 0700 In: 1278.7 [I.V.:1128.7; IV Piggyback:50] Out: 200 [Blood:200] Intake/Output this shift: No intake/output data recorded.  Exam:  No cellulitis present Compartment soft  Labs: No results for input(s): HGB in the last 72 hours. No results for input(s): WBC, RBC, HCT, PLT in the last 72 hours. No results for input(s): NA, K, CL, CO2, BUN, CREATININE, GLUCOSE, CALCIUM in the last 72 hours. No results for input(s): LABPT, INR in the last 72 hours.  Assessment/Plan: Plan at this time is discharged to home after occupational therapy.  CPM machine 1 hour 3 times a day.  Follow-up with me in 10 days.  Change dressing after 7 days.   G Scott Tarika Mckethan 12/14/2017, 8:40 AM

## 2017-12-14 NOTE — Progress Notes (Signed)
Occupational Therapy Treatment Patient Details Name: [name] [name] MRN: [id number] DOB: 30-Sep-1929 Today's Date: 12/14/2017    History of present illness Pt is an 84 y/o male who presents s/p L reverse shoulder arthroplasty on 12/13/17. PMH significant for thyroid nodule, MVP s/p mitral valve repair, CABG x2, PVC's, hiatal hernia s/p repair, CAD, CHF, CA, cataracts s/p extraction.   OT comments  Returning for second visit to review shoulder information with pt's son. Reviewed shoulder precautions, exercises, sling management/positioning, compensatory techniques for bathing and dressing, sleep positioning, and functional transfers. Pt's son verablized understanding. Pt presenting with decreased recall of prior education and requiring increased cues during review. Pt with questions about driving restrictions and discussing that he cannot drive on pain medication and until he is cleared by MD; son present. Pt planning for dc later today. All questions answered.    Follow Up Recommendations  Home health OT;Supervision/Assistance - 24 hour    Equipment Recommendations  None recommended by OT    Recommendations for Other Services PT consult    Precautions / Restrictions Precautions Precautions: Fall;Shoulder Type of Shoulder Precautions: Active protocal. NWB. FF 0-90, AB 0-60, and ER 0-30.  Shoulder Interventions: Shoulder sling/immobilizer;At all times;Off for dressing/bathing/exercises Precaution Booklet Issued: Yes (comment) Precaution Comments: Reviewed all shoulder precautions, exercises, and compensatory techniques for ADLs Required Braces or Orthoses: Sling Restrictions Weight Bearing Restrictions: No       Mobility Bed Mobility Overal bed mobility: Needs Assistance Bed Mobility: Supine to Sit     Supine to sit: Min guard     General bed mobility comments: In recliner upon arrival  Transfers Overall transfer level: Needs assistance Equipment used: None Transfers:  Sit to/from Stand Sit to Stand: Min guard         General transfer comment: Hands-on guarding for balance support and safety.     Balance Overall balance assessment: Needs assistance Sitting-balance support: Feet supported;No upper extremity supported Sitting balance-Leahy Scale: Fair     Standing balance support: No upper extremity supported;During functional activity Standing balance-Leahy Scale: Poor Standing balance comment: Requires assist                           ADL either performed or assessed with clinical judgement   ADL Overall ADL's : Needs assistance/impaired Eating/Feeding: Independent;Sitting   Grooming: Independent;Sitting   Upper Body Bathing: Minimal assistance;Sitting Upper Body Bathing Details (indicate cue type and reason): educating on compensatory techniques.  Lower Body Bathing: Min guard;Sit to/from stand   Upper Body Dressing : Maximal assistance;Sitting Upper Body Dressing Details (indicate cue type and reason): Max A to readjust sling position Lower Body Dressing: Minimal assistance;Sit to/from stand   Toilet Transfer: Min guard;Ambulation         Tub/Shower Transfer Details (indicate cue type and reason): Discussed safe shower transfers. and sue of shower chair Functional mobility during ADLs: Minimal assistance;Cane General ADL Comments: Returning for second visit to review ALL information with pt's son. Pt unable to recall exercises or dressing techniques. Pt's son verablizing understanding.      Vision       Perception     Praxis      Cognition Arousal/Alertness: Awake/alert Behavior During Therapy: Flat affect Overall Cognitive Status: Impaired/Different from baseline Area of Impairment: Memory                     Memory: Decreased short-term memory;Decreased recall of precautions  General Comments: Pt not recalling education provided earlier today. Feel this is probably baseline memory.         Exercises Exercises: Shoulder Shoulder Exercises Shoulder Flexion: PROM;Self ROM;Left;10 reps;Seated Shoulder Extension: PROM;Self ROM;Left;10 reps;Seated Shoulder ABduction: PROM;Self ROM;Left;10 reps;Seated Shoulder External Rotation: PROM;Left;10 reps;Seated Elbow Flexion: PROM;Self ROM;Left;10 reps;Seated(Unable to perform AROM due to nerve block) Elbow Extension: PROM;Self ROM;Left;10 reps;Seated Wrist Flexion: AROM;Left;10 reps;Seated Wrist Extension: AROM;Left;10 reps;Seated Digit Composite Flexion: AROM;Left;10 reps;Seated Composite Extension: AROM;Left;10 reps;Seated   Shoulder Instructions Shoulder Instructions Donning/doffing shirt without moving shoulder: Moderate assistance Method for sponge bathing under operated UE: Minimal assistance Donning/doffing sling/immobilizer: Maximal assistance Correct positioning of sling/immobilizer: Maximal assistance ROM for elbow, wrist and digits of operated UE: Minimal assistance Sling wearing schedule (on at all times/off for ADL's): Supervision/safety Proper positioning of operated UE when showering: Supervision/safety Positioning of UE while sleeping: Minimal assistance     General Comments Son present and reviewed all information including precautions, exercises, sling management, compensatory techniques for dressing/bathing, and sleep/rest positioning.    Pertinent Vitals/ Pain       Pain Assessment: No/denies pain  Home Living Family/patient expects to be discharged to:: Private residence Living Arrangements: Spouse/significant other(Wife not able to assist) Available Help at Discharge: Family;Available 24 hours/day(Daughter plans on being present at dc) Type of Home: House Home Access: Stairs to enter;Ramped entrance     Home Layout: One level     Bathroom Shower/Tub: Occupational psychologist: Standard     Home Equipment: Environmental consultant - 2 wheels;Probation officer)          Prior  Functioning/Environment Level of Independence: Independent        Comments: Pt was performing ADLs and IADLs as well as caring for his wife   Frequency  Min 2X/week        Progress Toward Goals  OT Goals(current goals can now be found in the care plan section)  Progress towards OT goals: Progressing toward goals  Acute Rehab OT Goals Patient Stated Goal: Be able to care for his wife OT Goal Formulation: With patient Time For Goal Achievement: 12/28/17 Potential to Achieve Goals: Good  Plan Discharge plan remains appropriate    Co-evaluation                 AM-PAC OT "6 Clicks" Daily Activity     Outcome Measure   Help from another person eating meals?: None Help from another person taking care of personal grooming?: A Little Help from another person toileting, which includes using toliet, bedpan, or urinal?: A Little Help from another person bathing (including washing, rinsing, drying)?: A Little Help from another person to put on and taking off regular upper body clothing?: A Lot Help from another person to put on and taking off regular lower body clothing?: A Little 6 Click Score: 18    End of Session Equipment Utilized During Treatment: Other (comment)(sling)  OT Visit Diagnosis: Other abnormalities of gait and mobility (R26.89);Unsteadiness on feet (R26.81);Muscle weakness (generalized) (M62.81)   Activity Tolerance Patient tolerated treatment well   Patient Left in chair;with call bell/phone within reach;with family/visitor present   Nurse Communication Mobility status;Precautions;Weight bearing status        Time: 7628-3151 OT Time Calculation (min): 17 min  Charges: OT General Charges $OT Visit: 1 Visit OT Evaluation $OT Eval Moderate Complexity: 1 Mod OT Treatments $Self Care/Home Management : 8-22 mins  [place], OTR/L Acute Rehab Pager: [phone] Office: [place]  12/14/2017, 1:15 PM

## 2017-12-14 NOTE — Care Management Note (Addendum)
Case Management Note  Patient Details  Name: [name] [name] MRN: [id number] Date of Birth: 06/19/1929  Subjective/Objective:                    Action/Plan:  Spoke w patient at bedside to set up [facility] care. He states he is familiar w [facility] as they have been to his home and worked with his wife. He would like to use [facility] for [facility] services. Referral placed to [facility]. No other CM needs at this time.   Expected Discharge Date:  12/14/17               Expected Discharge Plan:  [place]  In-House Referral:     Discharge planning Services  CM Consult  Post Acute Care Choice:  Home Health Choice offered to:  Patient  DME Arranged:    DME Agency:     HH Arranged:  PT, OT HH Agency:  [facility]  Status of Service:  Completed, signed off  If discussed at [place] of Stay Meetings, dates discussed:    Additional Comments:  Carles Collet, RN 12/14/2017, 10:54 AM

## 2017-12-14 NOTE — Evaluation (Signed)
Physical Therapy Evaluation and Discharge Patient Details Name: [name] [name] MRN: [id number] DOB: 28-Mar-1929 Today's Date: 12/14/2017   History of Present Illness  Pt is an 84 y/o male who presents s/p L reverse shoulder arthroplasty on 12/13/17. PMH significant for thyroid nodule, MVP s/p mitral valve repair, CABG x2, PVC's, hiatal hernia s/p repair, CAD, CHF, CA, cataracts s/p extraction.  Clinical Impression  Patient evaluated by Physical Therapy with no further acute PT needs identified. All education has been completed and the patient has no further questions. At the time of PT eval pt was able to perform transfers and ambulation with min guard assist to min assist for balance support and safety. Pt was improved with the SPC. Pt was educated on stair negotiation, car transfer, and sequencing with cane. As pt has more OT needs at this time, will sign off acutely and have HHPT follow up at d/c. See below for any follow-up Physical Therapy or equipment needs. PT is signing off. Thank you for this referral.     Follow Up Recommendations Home health PT;Supervision for mobility/OOB    Equipment Recommendations  None recommended by PT    Recommendations for Other Services       Precautions / Restrictions Precautions Precautions: Fall;Shoulder Restrictions Weight Bearing Restrictions: No      Mobility  Bed Mobility               General bed mobility comments: Pt was received EOB with OT  Transfers Overall transfer level: Needs assistance Equipment used: None Transfers: Sit to/from Stand Sit to Stand: Min guard         General transfer comment: Hands-on guarding for balance support and safety.   Ambulation/Gait Ambulation/Gait assistance: Min assist Gait Distance (Feet): 275 Feet Assistive device: None;1 person hand held assist;Straight cane Gait Pattern/deviations: Step-through pattern;Decreased stride length;Trunk flexed Gait velocity: Decreased Gait velocity  interpretation: 1.31 - 2.62 ft/sec, indicative of limited community ambulator General Gait Details: VC's for improved posture and forward gaze. Pt unsteady without UE support and required min assist at gait belt to maintain balance. HHA provided and noted significant unsteadiness with turns. Pt appeared improved with SPC.   Stairs Stairs: Yes Stairs assistance: Min guard Stair Management: One rail Right;Alternating pattern;Forwards Number of Stairs: 10 General stair comments: VC's for general safety awareness. Hands-on guarding required for safety.   Wheelchair Mobility    Modified Rankin (Stroke Patients Only)       Balance Overall balance assessment: Needs assistance Sitting-balance support: Feet supported;No upper extremity supported Sitting balance-Leahy Scale: Fair     Standing balance support: No upper extremity supported;During functional activity Standing balance-Leahy Scale: Poor Standing balance comment: Requires assist                             Pertinent Vitals/Pain Pain Assessment: No/denies pain    Home Living Family/patient expects to be discharged to:: Private residence Living Arrangements: Spouse/significant other(Wife not able to assist) Available Help at Discharge: Family;Available 24 hours/day(Daughter plans on being present at dc) Type of Home: House Home Access: Stairs to enter;Ramped entrance     Home Layout: One level Home Equipment: Walker - 2 wheels;Shower seat(rollator)      Prior Function Level of Independence: Independent         Comments: Pt was performing ADLs and IADLs as well as caring for his wife     Hand Dominance   Dominant Hand: Right  Extremity/Trunk Assessment   Upper Extremity Assessment Upper Extremity Assessment: LUE deficits/detail LUE Deficits / Details: s/p reverse total shoulder LUE: Unable to fully assess due to pain;Unable to fully assess due to immobilization LUE Coordination: decreased  gross motor    Lower Extremity Assessment Lower Extremity Assessment: Generalized weakness    Cervical / Trunk Assessment Cervical / Trunk Assessment: Kyphotic(Forward head posture with rounded shoulders)  Communication   Communication: No difficulties  Cognition Arousal/Alertness: Awake/alert Behavior During Therapy: Flat affect Overall Cognitive Status: Within Functional Limits for tasks assessed                                 General Comments: Pt not talkative, but feel this is baseline      General Comments      Exercises     Assessment/Plan    PT Assessment All further PT needs can be met in the next venue of care  PT Problem List         PT Treatment Interventions      PT Goals (Current goals can be found in the Care Plan section)  Acute Rehab PT Goals Patient Stated Goal: Be able to care for his wife PT Goal Formulation: All assessment and education complete, DC therapy    Frequency     Barriers to discharge        Co-evaluation               AM-PAC PT "6 Clicks" Mobility  Outcome Measure Help needed turning from your back to your side while in a flat bed without using bedrails?: A Little Help needed moving from lying on your back to sitting on the side of a flat bed without using bedrails?: A Little Help needed moving to and from a bed to a chair (including a wheelchair)?: A Little Help needed standing up from a chair using your arms (e.g., wheelchair or bedside chair)?: A Little Help needed to walk in hospital room?: A Little Help needed climbing 3-5 steps with a railing? : A Little 6 Click Score: 18    End of Session Equipment Utilized During Treatment: Gait belt;Other (comment)(Sling) Activity Tolerance: Patient tolerated treatment well Patient left: in chair;with call bell/phone within reach Nurse Communication: Mobility status PT Visit Diagnosis: Unsteadiness on feet (R26.81);Pain Pain - Right/Left: Left Pain - part of  body: Shoulder    Time: 2025-4270 PT Time Calculation (min) (ACUTE ONLY): 21 min   Charges:   PT Evaluation $PT Eval Moderate Complexity: 1 Mod          Rolinda Roan, PT, DPT Acute Rehabilitation Services Pager: [phone] Office: [phone]   Thelma Comp 12/14/2017, 9:59 AM

## 2017-12-16 DIAGNOSIS — Z9181 History of falling: Secondary | ICD-10-CM | POA: Diagnosis not present

## 2017-12-16 DIAGNOSIS — E049 Nontoxic goiter, unspecified: Secondary | ICD-10-CM | POA: Diagnosis not present

## 2017-12-16 DIAGNOSIS — M19042 Primary osteoarthritis, left hand: Secondary | ICD-10-CM | POA: Diagnosis not present

## 2017-12-16 DIAGNOSIS — Z7982 Long term (current) use of aspirin: Secondary | ICD-10-CM | POA: Diagnosis not present

## 2017-12-16 DIAGNOSIS — E785 Hyperlipidemia, unspecified: Secondary | ICD-10-CM | POA: Diagnosis not present

## 2017-12-16 DIAGNOSIS — I2510 Atherosclerotic heart disease of native coronary artery without angina pectoris: Secondary | ICD-10-CM | POA: Diagnosis not present

## 2017-12-16 DIAGNOSIS — Z96612 Presence of left artificial shoulder joint: Secondary | ICD-10-CM | POA: Diagnosis not present

## 2017-12-16 DIAGNOSIS — Z471 Aftercare following joint replacement surgery: Secondary | ICD-10-CM | POA: Diagnosis not present

## 2017-12-16 DIAGNOSIS — I509 Heart failure, unspecified: Secondary | ICD-10-CM | POA: Diagnosis not present

## 2017-12-16 DIAGNOSIS — Z950 Presence of cardiac pacemaker: Secondary | ICD-10-CM | POA: Diagnosis not present

## 2017-12-16 DIAGNOSIS — Z7901 Long term (current) use of anticoagulants: Secondary | ICD-10-CM | POA: Diagnosis not present

## 2017-12-16 DIAGNOSIS — Z8701 Personal history of pneumonia (recurrent): Secondary | ICD-10-CM | POA: Diagnosis not present

## 2017-12-16 DIAGNOSIS — K5790 Diverticulosis of intestine, part unspecified, without perforation or abscess without bleeding: Secondary | ICD-10-CM | POA: Diagnosis not present

## 2017-12-16 DIAGNOSIS — Z87891 Personal history of nicotine dependence: Secondary | ICD-10-CM | POA: Diagnosis not present

## 2017-12-16 DIAGNOSIS — K219 Gastro-esophageal reflux disease without esophagitis: Secondary | ICD-10-CM | POA: Diagnosis not present

## 2017-12-16 DIAGNOSIS — M19041 Primary osteoarthritis, right hand: Secondary | ICD-10-CM | POA: Diagnosis not present

## 2017-12-19 ENCOUNTER — Ambulatory Visit (INDEPENDENT_AMBULATORY_CARE_PROVIDER_SITE_OTHER): Payer: Medicare Other | Admitting: Orthopedic Surgery

## 2017-12-19 ENCOUNTER — Encounter (INDEPENDENT_AMBULATORY_CARE_PROVIDER_SITE_OTHER): Payer: Self-pay | Admitting: Orthopedic Surgery

## 2017-12-19 DIAGNOSIS — M12812 Other specific arthropathies, not elsewhere classified, left shoulder: Secondary | ICD-10-CM

## 2017-12-19 MED ORDER — HYDROXYZINE PAMOATE 25 MG PO CAPS: 25.0000 mg | ORAL_CAPSULE | Freq: Three times a day (TID) | ORAL | 0 refills | Status: DC | PRN

## 2017-12-19 NOTE — Discharge Summary (Signed)
Physician Discharge Summary  Patient ID: [name] [name] MRN: [id number] DOB/AGE: 1929/07/28 84 y.o.  Admit date: 12/13/2017 Discharge date: 12/14/2017  Admission Diagnoses:  Active Problems:   Shoulder arthritis   Rotator cuff arthropathy of left shoulder   Discharge Diagnoses:  Same  Surgeries: Procedure(s): LEFT REVERSE SHOULDER ARTHROPLASTY on 12/13/2017   Consultants:   Discharged Condition: Stable  Hospital Course: [name] [name] is an 84 y.o. male who was admitted 12/13/2017 with a chief complaint of left shoulder pain, and found to have a diagnosis of left shoulder rotator cuff arthropathy.  They were brought to the operating room on 12/13/2017 and underwent the above named procedures.  Patient tolerated the procedure well without immediate complications.  He was seen by occupational therapy on postop day #1.  Patient is discharged home in good condition with pain control by the block.  He will start on CPM machine for passive range of motion.  Follow-up with me in about 10 days.  Patient is sent home with appropriate pain medicine and muscle relaxation medicine.  Antibiotics given:  Anti-infectives (From admission, onward)   Start     Dose/Rate Route Frequency Ordered Stop   12/13/17 1130  ceFAZolin (ANCEF) IVPB 1 g/50 mL premix     1 g 100 mL/hr over 30 Minutes Intravenous Every 6 hours 12/13/17 1120 12/14/17 0754   12/13/17 0943  vancomycin (VANCOCIN) powder  Status:  Discontinued       As needed 12/13/17 0943 12/13/17 1018   12/13/17 0600  ceFAZolin (ANCEF) IVPB 2g/100 mL premix     2 g 200 mL/hr over 30 Minutes Intravenous On call to O.R. 12/13/17 7989 12/13/17 0739    .  Recent vital signs:  Vitals:   12/14/17 0733 12/14/17 0734  BP: (!) 99/56   Pulse: 71   Resp:  16  Temp: 98 F (36.7 C)   SpO2: 94%     Recent laboratory studies:  Results for orders placed or performed during the hospital encounter of 12/06/17  Surgical pcr screen  Result Value  Ref Range   MRSA, PCR NEGATIVE NEGATIVE   Staphylococcus aureus NEGATIVE NEGATIVE  Urine culture  Result Value Ref Range   Specimen Description URINE, CLEAN CATCH    Special Requests NONE    Culture      NO GROWTH Performed at [facility] [address] [place], [place] [address]    Report Status 12/07/2017 FINAL   Basic metabolic panel  Result Value Ref Range   Sodium 139 135 - 145 mmol/L   Potassium 4.5 3.5 - 5.1 mmol/L   Chloride 109 98 - 111 mmol/L   CO2 23 22 - 32 mmol/L   Glucose, Bld 92 70 - 99 mg/dL   BUN 25 (H) 8 - 23 mg/dL   Creatinine, Ser 1.12 0.61 - 1.24 mg/dL   Calcium 8.9 8.9 - 10.3 mg/dL   GFR calc non Af Amer 58 (L) >60 mL/min   GFR calc Af Amer >60 >60 mL/min   Anion gap 7 5 - 15  CBC  Result Value Ref Range   WBC 7.0 4.0 - 10.5 K/uL   RBC 4.08 (L) 4.22 - 5.81 MIL/uL   Hemoglobin 12.3 (L) 13.0 - 17.0 g/dL   HCT 39.9 39.0 - 52.0 %   MCV 97.8 80.0 - 100.0 fL   MCH 30.1 26.0 - 34.0 pg   MCHC 30.8 30.0 - 36.0 g/dL   RDW 13.0 11.5 - 15.5 %  Platelets 152 150 - 400 K/uL   nRBC 0.0 0.0 - 0.2 %  Urinalysis, Routine w reflex microscopic  Result Value Ref Range   Color, Urine AMBER (A) YELLOW   APPearance CLEAR CLEAR   Specific Gravity, Urine 1.027 1.005 - 1.030   pH 5.0 5.0 - 8.0   Glucose, UA NEGATIVE NEGATIVE mg/dL   Hgb urine dipstick NEGATIVE NEGATIVE   Bilirubin Urine NEGATIVE NEGATIVE   Ketones, ur NEGATIVE NEGATIVE mg/dL   Protein, ur NEGATIVE NEGATIVE mg/dL   Nitrite NEGATIVE NEGATIVE   Leukocytes, UA NEGATIVE NEGATIVE    Discharge Medications:   Allergies as of 12/14/2017      Reactions   Ivp Dye [iodinated Diagnostic Agents] Rash      Medication List    TAKE these medications   amiodarone 200 MG tablet Commonly known as:  PACERONE Take 100 mg by mouth daily.   aspirin EC 81 MG tablet Take 81 mg by mouth every evening.   ELIQUIS 5 MG Tabs tablet Generic drug:  apixaban Take 5 mg by mouth 2 (two) times daily.   Fish  Oil 1200 MG Caps Take 1,200 mg by mouth 2 (two) times daily.   furosemide 20 MG tablet Commonly known as:  LASIX Take 20 mg by mouth daily.   HYDROcodone-acetaminophen 5-325 MG tablet Commonly known as:  NORCO/VICODIN Take 1-2 tablets by mouth every 4 (four) hours as needed for moderate pain (pain score 4-6).   hydroxypropyl methylcellulose / hypromellose 2.5 % ophthalmic solution Commonly known as:  ISOPTO TEARS / GONIOVISC Place 1 drop into both eyes daily as needed for dry eyes.   methocarbamol 500 MG tablet Commonly known as:  ROBAXIN Take 1 tablet (500 mg total) by mouth every 8 (eight) hours as needed for muscle spasms.   multivitamin with minerals Tabs tablet Take 1 tablet by mouth daily.   potassium chloride SA 20 MEQ tablet Commonly known as:  K-DUR,KLOR-CON Take 20 mEq by mouth 2 (two) times daily.   simvastatin 20 MG tablet Commonly known as:  ZOCOR Take 1 tablet (20 mg total) by mouth at bedtime.       Diagnostic Studies: Dg Shoulder Left Port  Result Date: 12/13/2017 CLINICAL DATA:  Arthritis of the left shoulder. Status post left reverse shoulder arthroplasty. EXAM: LEFT SHOULDER - 1 VIEW COMPARISON:  CT scan dated 08/24/2017 FINDINGS: Single AP view of the shoulder demonstrates that the acetabular and humeral components of the reversed shoulder prosthesis appear in good position in this projection. No fractures. Slight atelectasis at the left lung base. IMPRESSION: Satisfactory appearance of the left shoulder in the AP projection after reversed shoulder arthroplasty. Atelectasis at the left lung base. Electronically Signed   By: Lorriane Shire M.D.   On: 12/13/2017 10:41    Disposition:   Discharge Instructions    Call MD / Call 911   Complete by:  As directed    If you experience chest pain or shortness of breath, CALL 911 and be transported to the hospital emergency room.  If you develope a fever above 101 F, pus (white drainage) or increased drainage or  redness at the wound, or calf pain, call your surgeon's office.   Constipation Prevention   Complete by:  As directed    Drink plenty of fluids.  Prune juice may be helpful.  You may use a stool softener, such as Colace (over the counter) 100 mg twice a day.  Use MiraLax (over the counter) for constipation as needed.  Diet - low sodium heart healthy   Complete by:  As directed    Discharge instructions   Complete by:  As directed    CPM machine 1 hour 3 times a day No lifting with left arm Okay to shower.  Dressing waterproof Change dressing in 6 days   Increase activity slowly as tolerated   Complete by:  As directed       Follow-up [facility] Follow up.   Why:  They will contact you in 1-2 days to set up your first [facility] appointment           Signed: [name] 12/19/2017, 11:27 PM

## 2017-12-19 NOTE — Progress Notes (Signed)
Post-Op Visit Note   Patient: [name] [name]           Date of Birth: 01/28/1929           MRN: [id number] Visit Date: 12/19/2017 PCP: Shirline Frees, MD   Assessment & Plan:  Chief Complaint:  Chief Complaint  Patient presents with  . Left Shoulder - Pain, Follow-up, Rash   Visit Diagnoses:  1. Rotator cuff arthropathy of left shoulder     Plan: Ze[facility] is now about a week out from left reverse shoulder replacement.  He is doing well but he developed a rash on his arms and trunk.  On exam he does have a red rash which is not cellulitis but it does appear to be some type of allergic reaction.  He has pretty reasonable motion of the shoulder and the deltoid fires.  He will need radiographs on review and return office visit.  Therapy to start later this week.  I will try him on Vistaril as an antihistamine for this allergic reaction.  Ace wrap wrist to shoulder.  Continue CPM machine okay to discontinue sling.  Follow-Up Instructions: Return in about 2 weeks (around 01/02/2018).   Orders:  No orders of the defined types were placed in this encounter.  Meds ordered this encounter  Medications  . hydrOXYzine (VISTARIL) 25 MG capsule    Sig: Take 1 capsule (25 mg total) by mouth 3 (three) times daily as needed.    Dispense:  30 capsule    Refill:  0    Imaging: No results found.  PMFS History: Patient Active Problem List   Diagnosis Date Noted  . Shoulder arthritis 12/13/2017  . Rotator cuff arthropathy of left shoulder   . S/P MVR (mitral valve repair) 10/11/2011  . S/P CABG (coronary artery bypass graft) 10/11/2011  . Ventricular tachycardia ([place]) 08/25/2011  . S/P mitral valve repair 08/19/2011  . S/P CABG x 2 08/19/2011  . Thyroid nodule 08/18/2011  . Severe mitral regurgitation 08/12/2011  . CHF (congestive heart failure) ([place])   . MR (mitral regurgitation)   . Cataract   . Dermatochalasis   . Hypercholesterolemia   . Systolic murmur   . Nephrolithiasis     . PVC (premature ventricular contraction)   . Pseudophakia   . Coronary artery disease 08/10/2011   Past Medical History:  Diagnosis Date  . Arthritis    hands  . Cancer ([place])    colon cancer 1984  . Cataract   . CHF (congestive heart failure) (H[facility])    takes Lasix daily  . Coronary artery disease 08/10/2011  . Dermatochalasis   . Diverticulosis   . Dyslipidemia    takes Niacin daily  . GERD (gastroesophageal reflux disease)    sometimes d/t food  . H/O hiatal hernia   . History of colon polyps   . History of kidney stones   . History of seasonal allergies    takes OTC allergy meds prn  . Hx: recurrent pneumonia   . Hypercholesterolemia   . MR (mitral regurgitation)   . MVP (mitral valve prolapse)   . Pneumonia    early July 2013  . Pseudophakia   . PVC (premature ventricular contraction)   . S/P CABG x 2 08/19/2011   LIMA to LAD, SVG to RCA, EVH via left thigh  . S/P mitral valve repair 08/19/2011   Complex valvuloplasty including triangular resection of posterior leaflet, artificial Goretex neocord placement x6 and 65mm Sorin Memo 3D ring annuloplasty  .  Shortness of breath    with exertion/lying/sitting  . Systolic murmur   . Thyroid nodule 08/18/2011   3 cm nodule discovered on chest CT scan  . Urinary frequency   . Urinary urgency     Family History  Problem Relation Age of Onset  . Cancer Mother   . Heart disease Father   . Glaucoma Sister   . Prostate cancer Other        Fraternal HX     Past Surgical History:  Procedure Laterality Date  . BUNIONECTOMY    . CARDIAC CATHETERIZATION  08/10/11  . CATARACT EXTRACTION     bilateral  . COLONOSCOPY    . CORONARY ARTERY BYPASS GRAFT  08/19/2011   Procedure: CORONARY ARTERY BYPASS GRAFTING (CABG);  Surgeon: Rexene Alberts, MD;  Location: [facility];  Service: Open Heart Surgery;  Laterality: N/A;  Coronary Artery Bypass Grafting times two using left internal mammary artery and left greater saphenous vein endoscopiclly  harvested  . CYSTOSCOPY     with manipulation of Ureteral Calculus  . ESOPHAGOGASTRODUODENOSCOPY     with dilitation  . HERNIA REPAIR    . LITHOTRIPSY     Whole body extracorporeal shock wave  . LITHOTRIPSY     Left ESL PUA GPE 01/03/2006,04/03/2006  . MITRAL VALVE REPAIR  08/19/2011   Procedure: MITRAL VALVE REPAIR (MVR);  Surgeon: Rexene Alberts, MD;  Location: [facility];  Service: Open Heart Surgery;  Laterality: N/A;  . partial colecotomy  1984  . REVERSE SHOULDER ARTHROPLASTY Left 12/13/2017   Procedure: LEFT REVERSE SHOULDER ARTHROPLASTY;  Surgeon: Meredith Pel, MD;  Location: [facility];  Service: Orthopedics;  Laterality: Left;   Social History   Occupational History  . Occupation: retired, Chief Financial Officer [employer]  Tobacco Use  . Smoking status: Former Smoker    Types: Cigarettes  . Smokeless tobacco: Never Used  . Tobacco comment: quit 66yrs ago  Substance and Sexual Activity  . Alcohol use: No  . Drug use: No  . Sexual activity: Yes

## 2017-12-20 ENCOUNTER — Telehealth (INDEPENDENT_AMBULATORY_CARE_PROVIDER_SITE_OTHER): Payer: Self-pay | Admitting: Orthopedic Surgery

## 2017-12-20 DIAGNOSIS — M19042 Primary osteoarthritis, left hand: Secondary | ICD-10-CM | POA: Diagnosis not present

## 2017-12-20 DIAGNOSIS — I509 Heart failure, unspecified: Secondary | ICD-10-CM | POA: Diagnosis not present

## 2017-12-20 DIAGNOSIS — K5790 Diverticulosis of intestine, part unspecified, without perforation or abscess without bleeding: Secondary | ICD-10-CM | POA: Diagnosis not present

## 2017-12-20 DIAGNOSIS — Z471 Aftercare following joint replacement surgery: Secondary | ICD-10-CM | POA: Diagnosis not present

## 2017-12-20 DIAGNOSIS — I2510 Atherosclerotic heart disease of native coronary artery without angina pectoris: Secondary | ICD-10-CM | POA: Diagnosis not present

## 2017-12-20 DIAGNOSIS — M19041 Primary osteoarthritis, right hand: Secondary | ICD-10-CM | POA: Diagnosis not present

## 2017-12-20 NOTE — Telephone Encounter (Signed)
Faxed to provided number  

## 2017-12-20 NOTE — Telephone Encounter (Signed)
What is your protocol for this?

## 2017-12-20 NOTE — Telephone Encounter (Signed)
Needed total shoulder protocol.  Please fax to: [phone]

## 2017-12-20 NOTE — Telephone Encounter (Signed)
Active and passive rom as tolerated - no restrictions in his case

## 2017-12-21 DIAGNOSIS — K5790 Diverticulosis of intestine, part unspecified, without perforation or abscess without bleeding: Secondary | ICD-10-CM | POA: Diagnosis not present

## 2017-12-21 DIAGNOSIS — M19042 Primary osteoarthritis, left hand: Secondary | ICD-10-CM | POA: Diagnosis not present

## 2017-12-21 DIAGNOSIS — Z471 Aftercare following joint replacement surgery: Secondary | ICD-10-CM | POA: Diagnosis not present

## 2017-12-21 DIAGNOSIS — I2510 Atherosclerotic heart disease of native coronary artery without angina pectoris: Secondary | ICD-10-CM | POA: Diagnosis not present

## 2017-12-21 DIAGNOSIS — M19041 Primary osteoarthritis, right hand: Secondary | ICD-10-CM | POA: Diagnosis not present

## 2017-12-21 DIAGNOSIS — I509 Heart failure, unspecified: Secondary | ICD-10-CM | POA: Diagnosis not present

## 2017-12-23 DIAGNOSIS — T7840XD Allergy, unspecified, subsequent encounter: Secondary | ICD-10-CM | POA: Diagnosis not present

## 2017-12-26 ENCOUNTER — Inpatient Hospital Stay (INDEPENDENT_AMBULATORY_CARE_PROVIDER_SITE_OTHER): Payer: Medicare Other | Admitting: Orthopedic Surgery

## 2017-12-26 DIAGNOSIS — Z471 Aftercare following joint replacement surgery: Secondary | ICD-10-CM | POA: Diagnosis not present

## 2017-12-26 DIAGNOSIS — M19041 Primary osteoarthritis, right hand: Secondary | ICD-10-CM | POA: Diagnosis not present

## 2017-12-26 DIAGNOSIS — K5790 Diverticulosis of intestine, part unspecified, without perforation or abscess without bleeding: Secondary | ICD-10-CM | POA: Diagnosis not present

## 2017-12-26 DIAGNOSIS — I509 Heart failure, unspecified: Secondary | ICD-10-CM | POA: Diagnosis not present

## 2017-12-26 DIAGNOSIS — I2510 Atherosclerotic heart disease of native coronary artery without angina pectoris: Secondary | ICD-10-CM | POA: Diagnosis not present

## 2017-12-26 DIAGNOSIS — M19042 Primary osteoarthritis, left hand: Secondary | ICD-10-CM | POA: Diagnosis not present

## 2017-12-28 DIAGNOSIS — M19041 Primary osteoarthritis, right hand: Secondary | ICD-10-CM | POA: Diagnosis not present

## 2017-12-28 DIAGNOSIS — Z471 Aftercare following joint replacement surgery: Secondary | ICD-10-CM | POA: Diagnosis not present

## 2017-12-28 DIAGNOSIS — I509 Heart failure, unspecified: Secondary | ICD-10-CM | POA: Diagnosis not present

## 2017-12-28 DIAGNOSIS — M19042 Primary osteoarthritis, left hand: Secondary | ICD-10-CM | POA: Diagnosis not present

## 2017-12-28 DIAGNOSIS — I2510 Atherosclerotic heart disease of native coronary artery without angina pectoris: Secondary | ICD-10-CM | POA: Diagnosis not present

## 2017-12-28 DIAGNOSIS — K5790 Diverticulosis of intestine, part unspecified, without perforation or abscess without bleeding: Secondary | ICD-10-CM | POA: Diagnosis not present

## 2017-12-29 DIAGNOSIS — K5790 Diverticulosis of intestine, part unspecified, without perforation or abscess without bleeding: Secondary | ICD-10-CM | POA: Diagnosis not present

## 2017-12-29 DIAGNOSIS — M19042 Primary osteoarthritis, left hand: Secondary | ICD-10-CM | POA: Diagnosis not present

## 2017-12-29 DIAGNOSIS — M19041 Primary osteoarthritis, right hand: Secondary | ICD-10-CM | POA: Diagnosis not present

## 2017-12-29 DIAGNOSIS — I2510 Atherosclerotic heart disease of native coronary artery without angina pectoris: Secondary | ICD-10-CM | POA: Diagnosis not present

## 2017-12-29 DIAGNOSIS — I509 Heart failure, unspecified: Secondary | ICD-10-CM | POA: Diagnosis not present

## 2017-12-29 DIAGNOSIS — Z471 Aftercare following joint replacement surgery: Secondary | ICD-10-CM | POA: Diagnosis not present

## 2018-01-02 ENCOUNTER — Ambulatory Visit (INDEPENDENT_AMBULATORY_CARE_PROVIDER_SITE_OTHER): Payer: Medicare Other | Admitting: Orthopedic Surgery

## 2018-01-02 ENCOUNTER — Encounter (INDEPENDENT_AMBULATORY_CARE_PROVIDER_SITE_OTHER): Payer: Self-pay | Admitting: Orthopedic Surgery

## 2018-01-02 DIAGNOSIS — M19042 Primary osteoarthritis, left hand: Secondary | ICD-10-CM | POA: Diagnosis not present

## 2018-01-02 DIAGNOSIS — Z950 Presence of cardiac pacemaker: Secondary | ICD-10-CM | POA: Diagnosis not present

## 2018-01-02 DIAGNOSIS — K5790 Diverticulosis of intestine, part unspecified, without perforation or abscess without bleeding: Secondary | ICD-10-CM | POA: Diagnosis not present

## 2018-01-02 DIAGNOSIS — I509 Heart failure, unspecified: Secondary | ICD-10-CM | POA: Diagnosis not present

## 2018-01-02 DIAGNOSIS — Z96612 Presence of left artificial shoulder joint: Secondary | ICD-10-CM | POA: Diagnosis not present

## 2018-01-02 DIAGNOSIS — E049 Nontoxic goiter, unspecified: Secondary | ICD-10-CM | POA: Diagnosis not present

## 2018-01-02 DIAGNOSIS — Z471 Aftercare following joint replacement surgery: Secondary | ICD-10-CM | POA: Diagnosis not present

## 2018-01-02 DIAGNOSIS — M19041 Primary osteoarthritis, right hand: Secondary | ICD-10-CM | POA: Diagnosis not present

## 2018-01-02 DIAGNOSIS — Z87891 Personal history of nicotine dependence: Secondary | ICD-10-CM | POA: Diagnosis not present

## 2018-01-02 DIAGNOSIS — I2510 Atherosclerotic heart disease of native coronary artery without angina pectoris: Secondary | ICD-10-CM | POA: Diagnosis not present

## 2018-01-02 DIAGNOSIS — K219 Gastro-esophageal reflux disease without esophagitis: Secondary | ICD-10-CM | POA: Diagnosis not present

## 2018-01-02 DIAGNOSIS — E785 Hyperlipidemia, unspecified: Secondary | ICD-10-CM | POA: Diagnosis not present

## 2018-01-02 DIAGNOSIS — Z8701 Personal history of pneumonia (recurrent): Secondary | ICD-10-CM | POA: Diagnosis not present

## 2018-01-04 ENCOUNTER — Encounter (INDEPENDENT_AMBULATORY_CARE_PROVIDER_SITE_OTHER): Payer: Self-pay | Admitting: Orthopedic Surgery

## 2018-01-04 NOTE — Progress Notes (Signed)
Post-Op Visit Note   Patient: [name] [name]           Date of Birth: [birth year]           MRN: [id number] Visit Date: 01/02/2018 PCP: Shirline Frees, MD   Assessment & Plan:  Chief Complaint:  Chief Complaint  Patient presents with  . Left Shoulder - Routine Post Op   Visit Diagnoses:  1. Status post reverse arthroplasty of left shoulder     Plan: Patient presents now 3 weeks out left reverse shoulder replacement.  He is doing well.  Not having any pain.  He is taking care of his wife who has multiple medical issues.  His daughter is now living with him as well.  On examination the patient has good shoulder range of motion on the left-hand side with forward flexion and abduction both above 90.  His rash is resolved and the swelling in the arm has resolved.  Plan at this time is to continue therapy but do not lift anything more than 5 pounds.  Follow-up in 4 weeks for final check.  Follow-Up Instructions: Return in about 4 weeks (around 01/30/2018).   Orders:  No orders of the defined types were placed in this encounter.  No orders of the defined types were placed in this encounter.   Imaging: No results found.  PMFS History: Patient Active Problem List   Diagnosis Date Noted  . Shoulder arthritis 12/13/2017  . Rotator cuff arthropathy of left shoulder   . S/P MVR (mitral valve repair) 10/11/2011  . S/P CABG (coronary artery bypass graft) 10/11/2011  . Ventricular tachycardia ([facility]) 08/25/2011  . S/P mitral valve repair 08/19/2011  . S/P CABG x 2 08/19/2011  . Thyroid nodule 08/18/2011  . Severe mitral regurgitation 08/12/2011  . CHF (congestive heart failure) ([facility])   . MR (mitral regurgitation)   . Cataract   . Dermatochalasis   . Hypercholesterolemia   . Systolic murmur   . Nephrolithiasis   . PVC (premature ventricular contraction)   . Pseudophakia   . Coronary artery disease 08/10/2011   Past Medical History:  Diagnosis Date  . Arthritis    hands  .  Cancer ([facility])    colon cancer 1984  . Cataract   . CHF (congestive heart failure) (H[facility])    takes Lasix daily  . Coronary artery disease 08/10/2011  . Dermatochalasis   . Diverticulosis   . Dyslipidemia    takes Niacin daily  . GERD (gastroesophageal reflux disease)    sometimes d/t food  . H/O hiatal hernia   . History of colon polyps   . History of kidney stones   . History of seasonal allergies    takes OTC allergy meds prn  . Hx: recurrent pneumonia   . Hypercholesterolemia   . MR (mitral regurgitation)   . MVP (mitral valve prolapse)   . Pneumonia    early July 2013  . Pseudophakia   . PVC (premature ventricular contraction)   . S/P CABG x 2 08/19/2011   LIMA to LAD, SVG to RCA, EVH via left thigh  . S/P mitral valve repair 08/19/2011   Complex valvuloplasty including triangular resection of posterior leaflet, artificial Goretex neocord placement x6 and 64mm Sorin Memo 3D ring annuloplasty  . Shortness of breath    with exertion/lying/sitting  . Systolic murmur   . Thyroid nodule 08/18/2011   3 cm nodule discovered on chest CT scan  . Urinary frequency   . Urinary urgency  Family History  Problem Relation Age of Onset  . Cancer Mother   . Heart disease Father   . Glaucoma Sister   . Prostate cancer Other        Fraternal HX     Past Surgical History:  Procedure Laterality Date  . BUNIONECTOMY    . CARDIAC CATHETERIZATION  08/10/11  . CATARACT EXTRACTION     bilateral  . COLONOSCOPY    . CORONARY ARTERY BYPASS GRAFT  08/19/2011   Procedure: CORONARY ARTERY BYPASS GRAFTING (CABG);  Surgeon: Rexene Alberts, MD;  Location: [facility];  Service: Open Heart Surgery;  Laterality: N/A;  Coronary Artery Bypass Grafting times two using left internal mammary artery and left greater saphenous vein endoscopiclly harvested  . CYSTOSCOPY     with manipulation of Ureteral Calculus  . ESOPHAGOGASTRODUODENOSCOPY     with dilitation  . HERNIA REPAIR    . LITHOTRIPSY     Whole body  extracorporeal shock wave  . LITHOTRIPSY     Left ESL PUA GPE 01/03/2006,04/03/2006  . MITRAL VALVE REPAIR  08/19/2011   Procedure: MITRAL VALVE REPAIR (MVR);  Surgeon: Rexene Alberts, MD;  Location: [facility];  Service: Open Heart Surgery;  Laterality: N/A;  . partial colecotomy  1984  . REVERSE SHOULDER ARTHROPLASTY Left 12/13/2017   Procedure: LEFT REVERSE SHOULDER ARTHROPLASTY;  Surgeon: Meredith Pel, MD;  Location: [facility];  Service: Orthopedics;  Laterality: Left;   Social History   Occupational History  . Occupation: retired, Chief Financial Officer [employer]  Tobacco Use  . Smoking status: Former Smoker    Types: Cigarettes  . Smokeless tobacco: Never Used  . Tobacco comment: quit 73yrs ago  Substance and Sexual Activity  . Alcohol use: No  . Drug use: No  . Sexual activity: Yes

## 2018-01-05 DIAGNOSIS — K5790 Diverticulosis of intestine, part unspecified, without perforation or abscess without bleeding: Secondary | ICD-10-CM | POA: Diagnosis not present

## 2018-01-05 DIAGNOSIS — Z471 Aftercare following joint replacement surgery: Secondary | ICD-10-CM | POA: Diagnosis not present

## 2018-01-05 DIAGNOSIS — I2510 Atherosclerotic heart disease of native coronary artery without angina pectoris: Secondary | ICD-10-CM | POA: Diagnosis not present

## 2018-01-05 DIAGNOSIS — M19041 Primary osteoarthritis, right hand: Secondary | ICD-10-CM | POA: Diagnosis not present

## 2018-01-05 DIAGNOSIS — I509 Heart failure, unspecified: Secondary | ICD-10-CM | POA: Diagnosis not present

## 2018-01-05 DIAGNOSIS — M19042 Primary osteoarthritis, left hand: Secondary | ICD-10-CM | POA: Diagnosis not present

## 2018-01-10 DIAGNOSIS — I509 Heart failure, unspecified: Secondary | ICD-10-CM | POA: Diagnosis not present

## 2018-01-10 DIAGNOSIS — I2510 Atherosclerotic heart disease of native coronary artery without angina pectoris: Secondary | ICD-10-CM | POA: Diagnosis not present

## 2018-01-10 DIAGNOSIS — K5790 Diverticulosis of intestine, part unspecified, without perforation or abscess without bleeding: Secondary | ICD-10-CM | POA: Diagnosis not present

## 2018-01-10 DIAGNOSIS — M19041 Primary osteoarthritis, right hand: Secondary | ICD-10-CM | POA: Diagnosis not present

## 2018-01-10 DIAGNOSIS — M19042 Primary osteoarthritis, left hand: Secondary | ICD-10-CM | POA: Diagnosis not present

## 2018-01-10 DIAGNOSIS — Z471 Aftercare following joint replacement surgery: Secondary | ICD-10-CM | POA: Diagnosis not present

## 2018-01-16 DIAGNOSIS — I2510 Atherosclerotic heart disease of native coronary artery without angina pectoris: Secondary | ICD-10-CM | POA: Diagnosis not present

## 2018-01-16 DIAGNOSIS — M19042 Primary osteoarthritis, left hand: Secondary | ICD-10-CM | POA: Diagnosis not present

## 2018-01-16 DIAGNOSIS — Z471 Aftercare following joint replacement surgery: Secondary | ICD-10-CM | POA: Diagnosis not present

## 2018-01-16 DIAGNOSIS — K5790 Diverticulosis of intestine, part unspecified, without perforation or abscess without bleeding: Secondary | ICD-10-CM | POA: Diagnosis not present

## 2018-01-16 DIAGNOSIS — M19041 Primary osteoarthritis, right hand: Secondary | ICD-10-CM | POA: Diagnosis not present

## 2018-01-16 DIAGNOSIS — I509 Heart failure, unspecified: Secondary | ICD-10-CM | POA: Diagnosis not present

## 2018-01-30 ENCOUNTER — Ambulatory Visit (INDEPENDENT_AMBULATORY_CARE_PROVIDER_SITE_OTHER): Payer: Medicare Other | Admitting: Orthopedic Surgery

## 2018-01-30 ENCOUNTER — Encounter (INDEPENDENT_AMBULATORY_CARE_PROVIDER_SITE_OTHER): Payer: Self-pay | Admitting: Orthopedic Surgery

## 2018-01-30 VITALS — Ht 68.0 in | Wt 160.0 lb

## 2018-01-30 DIAGNOSIS — Z96612 Presence of left artificial shoulder joint: Secondary | ICD-10-CM

## 2018-01-30 NOTE — Progress Notes (Signed)
Post-Op Visit Note   Patient: [name] [name]           Date of Birth: 23-Aug-1929           MRN: [id number] Visit Date: 01/30/2018 PCP: Shirline Frees, MD   Assessment & Plan:  Chief Complaint:  Chief Complaint  Patient presents with  . Left Shoulder - Follow-up    12/13/17 Left Reverse Shoulder Arthroplasty   Visit Diagnoses:  1. Status post reverse arthroplasty of left shoulder     Plan: [name] [name] is an 85 year old patient is about 6 weeks out left reverse shoulder replacement.  He is doing well.  On exam he has forward flexion and abduction both easily above 90 degrees.  Strength is also good.  Pain relief is been good.  He is finished physical therapy and is doing home exercises.  He is able to care for his wife.  I will increase his lifting restriction up about 15 pounds and see him back in 6 weeks for final check.  Follow-Up Instructions: Return in about 6 weeks (around 03/13/2018).   Orders:  No orders of the defined types were placed in this encounter.  No orders of the defined types were placed in this encounter.   Imaging: No results found.  PMFS History: Patient Active Problem List   Diagnosis Date Noted  . Shoulder arthritis 12/13/2017  . Rotator cuff arthropathy of left shoulder   . S/P MVR (mitral valve repair) 10/11/2011  . S/P CABG (coronary artery bypass graft) 10/11/2011  . Ventricular tachycardia ([place]) 08/25/2011  . S/P mitral valve repair 08/19/2011  . S/P CABG x 2 08/19/2011  . Thyroid nodule 08/18/2011  . Severe mitral regurgitation 08/12/2011  . CHF (congestive heart failure) ([place])   . MR (mitral regurgitation)   . Cataract   . Dermatochalasis   . Hypercholesterolemia   . Systolic murmur   . Nephrolithiasis   . PVC (premature ventricular contraction)   . Pseudophakia   . Coronary artery disease 08/10/2011   Past Medical History:  Diagnosis Date  . Arthritis    hands  . Cancer ([place])    colon cancer 1984  . Cataract   . CHF  (congestive heart failure) (HCC)    takes Lasix daily  . Coronary artery disease 08/10/2011  . Dermatochalasis   . Diverticulosis   . Dyslipidemia    takes Niacin daily  . GERD (gastroesophageal reflux disease)    sometimes d/t food  . H/O hiatal hernia   . History of colon polyps   . History of kidney stones   . History of seasonal allergies    takes OTC allergy meds prn  . Hx: recurrent pneumonia   . Hypercholesterolemia   . MR (mitral regurgitation)   . MVP (mitral valve prolapse)   . Pneumonia    early July 2013  . Pseudophakia   . PVC (premature ventricular contraction)   . S/P CABG x 2 08/19/2011   LIMA to LAD, SVG to RCA, EVH via left thigh  . S/P mitral valve repair 08/19/2011   Complex valvuloplasty including triangular resection of posterior leaflet, artificial Goretex neocord placement x6 and 55mm Sorin Memo 3D ring annuloplasty  . Shortness of breath    with exertion/lying/sitting  . Systolic murmur   . Thyroid nodule 08/18/2011   3 cm nodule discovered on chest CT scan  . Urinary frequency   . Urinary urgency     Family History  Problem Relation Age of Onset  .  Cancer Mother   . Heart disease Father   . Glaucoma Sister   . Prostate cancer Other        Fraternal HX     Past Surgical History:  Procedure Laterality Date  . BUNIONECTOMY    . CARDIAC CATHETERIZATION  08/10/11  . CATARACT EXTRACTION     bilateral  . COLONOSCOPY    . CORONARY ARTERY BYPASS GRAFT  08/19/2011   Procedure: CORONARY ARTERY BYPASS GRAFTING (CABG);  Surgeon: Rexene Alberts, MD;  Location: [facility];  Service: Open Heart Surgery;  Laterality: N/A;  Coronary Artery Bypass Grafting times two using left internal mammary artery and left greater saphenous vein endoscopiclly harvested  . CYSTOSCOPY     with manipulation of Ureteral Calculus  . ESOPHAGOGASTRODUODENOSCOPY     with dilitation  . HERNIA REPAIR    . LITHOTRIPSY     Whole body extracorporeal shock wave  . LITHOTRIPSY     Left ESL  PUA GPE 01/03/2006,04/03/2006  . MITRAL VALVE REPAIR  08/19/2011   Procedure: MITRAL VALVE REPAIR (MVR);  Surgeon: Rexene Alberts, MD;  Location: [facility];  Service: Open Heart Surgery;  Laterality: N/A;  . partial colecotomy  1984  . REVERSE SHOULDER ARTHROPLASTY Left 12/13/2017   Procedure: LEFT REVERSE SHOULDER ARTHROPLASTY;  Surgeon: Meredith Pel, MD;  Location: [facility];  Service: Orthopedics;  Laterality: Left;   Social History   Occupational History  . Occupation: retired, Chief Financial Officer [employer]  Tobacco Use  . Smoking status: Former Smoker    Types: Cigarettes  . Smokeless tobacco: Never Used  . Tobacco comment: quit 66yrs ago  Substance and Sexual Activity  . Alcohol use: No  . Drug use: No  . Sexual activity: Yes

## 2018-02-04 DIAGNOSIS — J069 Acute upper respiratory infection, unspecified: Secondary | ICD-10-CM | POA: Diagnosis not present

## 2018-02-04 DIAGNOSIS — J029 Acute pharyngitis, unspecified: Secondary | ICD-10-CM | POA: Diagnosis not present

## 2018-02-12 DIAGNOSIS — M25511 Pain in right shoulder: Secondary | ICD-10-CM | POA: Diagnosis not present

## 2018-02-13 ENCOUNTER — Telehealth (INDEPENDENT_AMBULATORY_CARE_PROVIDER_SITE_OTHER): Payer: Self-pay | Admitting: Orthopedic Surgery

## 2018-02-13 NOTE — Telephone Encounter (Signed)
[name] [name] called in to make an appointment with Dr. Marlou Sa for his shoulder.  He stated that a few days ago he had "blood pressure issues" and passed out.  He says this upset his daughter who then called 911.  He was seen in the ER and his shoulder was x-rayed.  They advised him to follow up with his orthopedic doctor.  I asked  [name] [name] if he was planning to follow up with his medical doctor to address the reason for his blood pressure/fall.  He stated that he just wanted to speak with Dr. Marlou Sa about his issues.  I encouraged [name] [name] to call his primary care doctor's office and make an appointment.

## 2018-02-15 ENCOUNTER — Ambulatory Visit (INDEPENDENT_AMBULATORY_CARE_PROVIDER_SITE_OTHER): Payer: Medicare Other

## 2018-02-15 ENCOUNTER — Encounter (INDEPENDENT_AMBULATORY_CARE_PROVIDER_SITE_OTHER): Payer: Self-pay | Admitting: Orthopedic Surgery

## 2018-02-15 ENCOUNTER — Ambulatory Visit (INDEPENDENT_AMBULATORY_CARE_PROVIDER_SITE_OTHER): Payer: Medicare Other | Admitting: Orthopedic Surgery

## 2018-02-15 VITALS — Ht 68.0 in | Wt 160.0 lb

## 2018-02-15 DIAGNOSIS — R55 Syncope and collapse: Secondary | ICD-10-CM | POA: Diagnosis not present

## 2018-02-15 DIAGNOSIS — M542 Cervicalgia: Secondary | ICD-10-CM | POA: Diagnosis not present

## 2018-02-15 DIAGNOSIS — I480 Paroxysmal atrial fibrillation: Secondary | ICD-10-CM | POA: Diagnosis not present

## 2018-02-15 MED ORDER — METHOCARBAMOL 500 MG PO TABS: ORAL_TABLET | ORAL | 0 refills | Status: DC

## 2018-02-15 NOTE — Addendum Note (Signed)
Addended by: Tyrone Apple on: 02/15/2018 04:03 PM   Modules accepted: Orders

## 2018-02-15 NOTE — Progress Notes (Signed)
Office Visit Note   Patient: CECIL [name]           Date of Birth: May 06, 1929           MRN: [id number] Visit Date: 02/15/2018 Requested by: Shirline Frees, MD [place] [address], [place] [address] PCP: Shirline Frees, MD  Subjective: Chief Complaint  Patient presents with  . Right Shoulder - Pain    HPI: [name] [name] is a patient who is doing well from his left reverse shoulder replacement.  He did go to the emergency room on Sunday with right shoulder and trapezial pain.  Denies any injuries.  States the pain was severe and intense.  They prescribed Aleve.  Shoulder radiographs on the right-hand side were negative for any acute pathology.  He is on Eliquis as a blood thinner.  He describes numbness in the trapezial region.  The pain is affecting his blood pressure by his report.  He did have 2 episodes of passing out which will be investigated by his primary care provider later today.              ROS: All systems reviewed are negative as they relate to the chief complaint within the history of present illness.  Patient denies  fevers or chills.   Assessment & Plan: Visit Diagnoses:  1. Neck pain     Plan: Impression is neck pain and trapezial pain on the right-hand side.  No definite radicular symptoms but radiographs show significant degenerative changes.  On Eliquis I think he needs to decide if this is bad enough to consider injections and MRI scan.  I think for now trying a steroid Dosepak an muscle relaxers would be indicated.  We will see him back in about 3 weeks to decide if we need to do further imaging.  Would probably want to hold off on the steroid due to his blood pressure fluctuation.  Follow-Up Instructions: No follow-ups on file.   Orders:  Orders Placed This Encounter  Procedures  . XR Cervical Spine 2 or 3 views   No orders of the defined types were placed in this encounter.     Procedures: No procedures performed   Clinical Data: No  additional findings.  Objective: Vital Signs: Ht 5\' 8"  (1.727 m)   Wt 160 lb (72.6 kg)   BMI 24.33 kg/m   Physical Exam:   Constitutional: Patient appears well-developed HEENT:  Head: Normocephalic Eyes:EOM are normal Neck: Normal range of motion Cardiovascular: Normal rate Pulmonary/chest: Effort normal Neurologic: Patient is alert Skin: Skin is warm Psychiatric: Patient has normal mood and affect    Ortho Exam: Examination of that right shoulder demonstrates some crepitus but full active forward flexion abduction.  He is got a little stiffness with neck extension only extending about 20 degrees.  Forward flexion is chin to chest.  Rotation is about 40 degrees.  Motor sensory function of the right arm is intact.  Does have some paresthesias in the trapezial region.  No muscle atrophy in this region.  Specialty Comments:  No specialty comments available.  Imaging: Xr Cervical Spine 2 Or 3 Views  Result Date: 02/15/2018 AP lateral cervical spine reviewed.  There is some focal kyphosis at C8-T1.  Degenerative disc disease is present at C5-6 C6-7 predominantly.  Facet arthritis is present at C8-T1.  Shoulder prosthesis noted in good position and alignment.    PMFS History: Patient Active Problem List   Diagnosis Date Noted  . Shoulder arthritis 12/13/2017  .  Rotator cuff arthropathy of left shoulder   . S/P MVR (mitral valve repair) 10/11/2011  . S/P CABG (coronary artery bypass graft) 10/11/2011  . Ventricular tachycardia ([place]) 08/25/2011  . S/P mitral valve repair 08/19/2011  . S/P CABG x 2 08/19/2011  . Thyroid nodule 08/18/2011  . Severe mitral regurgitation 08/12/2011  . CHF (congestive heart failure) ([place])   . MR (mitral regurgitation)   . Cataract   . Dermatochalasis   . Hypercholesterolemia   . Systolic murmur   . Nephrolithiasis   . PVC (premature ventricular contraction)   . Pseudophakia   . Coronary artery disease 08/10/2011   Past Medical History:    Diagnosis Date  . Arthritis    hands  . Cancer ([place])    colon cancer 1984  . Cataract   . CHF (congestive heart failure) (HCC)    takes Lasix daily  . Coronary artery disease 08/10/2011  . Dermatochalasis   . Diverticulosis   . Dyslipidemia    takes Niacin daily  . GERD (gastroesophageal reflux disease)    sometimes d/t food  . H/O hiatal hernia   . History of colon polyps   . History of kidney stones   . History of seasonal allergies    takes OTC allergy meds prn  . Hx: recurrent pneumonia   . Hypercholesterolemia   . MR (mitral regurgitation)   . MVP (mitral valve prolapse)   . Pneumonia    early July 2013  . Pseudophakia   . PVC (premature ventricular contraction)   . S/P CABG x 2 08/19/2011   LIMA to LAD, SVG to RCA, EVH via left thigh  . S/P mitral valve repair 08/19/2011   Complex valvuloplasty including triangular resection of posterior leaflet, artificial Goretex neocord placement x6 and 26mm Sorin Memo 3D ring annuloplasty  . Shortness of breath    with exertion/lying/sitting  . Systolic murmur   . Thyroid nodule 08/18/2011   3 cm nodule discovered on chest CT scan  . Urinary frequency   . Urinary urgency     Family History  Problem Relation Age of Onset  . Cancer Mother   . Heart disease Father   . Glaucoma Sister   . Prostate cancer Other        Fraternal HX     Past Surgical History:  Procedure Laterality Date  . BUNIONECTOMY    . CARDIAC CATHETERIZATION  08/10/11  . CATARACT EXTRACTION     bilateral  . COLONOSCOPY    . CORONARY ARTERY BYPASS GRAFT  08/19/2011   Procedure: CORONARY ARTERY BYPASS GRAFTING (CABG);  Surgeon: Rexene Alberts, MD;  Location: [facility];  Service: Open Heart Surgery;  Laterality: N/A;  Coronary Artery Bypass Grafting times two using left internal mammary artery and left greater saphenous vein endoscopiclly harvested  . CYSTOSCOPY     with manipulation of Ureteral Calculus  . ESOPHAGOGASTRODUODENOSCOPY     with dilitation  .  HERNIA REPAIR    . LITHOTRIPSY     Whole body extracorporeal shock wave  . LITHOTRIPSY     Left ESL PUA GPE 01/03/2006,04/03/2006  . MITRAL VALVE REPAIR  08/19/2011   Procedure: MITRAL VALVE REPAIR (MVR);  Surgeon: Rexene Alberts, MD;  Location: [facility];  Service: Open Heart Surgery;  Laterality: N/A;  . partial colecotomy  1984  . REVERSE SHOULDER ARTHROPLASTY Left 12/13/2017   Procedure: LEFT REVERSE SHOULDER ARTHROPLASTY;  Surgeon: Meredith Pel, MD;  Location: [facility];  Service: Orthopedics;  Laterality:  Left;   Social History   Occupational History  . Occupation: retired, Chief Financial Officer [employer]  Tobacco Use  . Smoking status: Former Smoker    Types: Cigarettes  . Smokeless tobacco: Never Used  . Tobacco comment: quit 10yrs ago  Substance and Sexual Activity  . Alcohol use: No  . Drug use: No  . Sexual activity: Yes

## 2018-02-16 ENCOUNTER — Telehealth (INDEPENDENT_AMBULATORY_CARE_PROVIDER_SITE_OTHER): Payer: Self-pay | Admitting: Orthopedic Surgery

## 2018-02-16 NOTE — Telephone Encounter (Signed)
LMOM for patient letting him know I just called the pharmacy and they said they are working on the Rx

## 2018-02-16 NOTE — Telephone Encounter (Signed)
Pt called in about medication not being able to be filled at pharmacy. States they told him something was wrong with the prescription. He would like to know if this can be fixed and a phone call when its sent back to pharmacy so he knows when he can head back. The phone number he can be reached on is [phone]. States he was in office last week on Tuesday

## 2018-02-21 DIAGNOSIS — I480 Paroxysmal atrial fibrillation: Secondary | ICD-10-CM | POA: Diagnosis not present

## 2018-02-21 DIAGNOSIS — Z7901 Long term (current) use of anticoagulants: Secondary | ICD-10-CM | POA: Diagnosis not present

## 2018-02-21 DIAGNOSIS — E785 Hyperlipidemia, unspecified: Secondary | ICD-10-CM | POA: Diagnosis not present

## 2018-02-21 DIAGNOSIS — I519 Heart disease, unspecified: Secondary | ICD-10-CM | POA: Diagnosis not present

## 2018-02-21 DIAGNOSIS — Z87891 Personal history of nicotine dependence: Secondary | ICD-10-CM | POA: Diagnosis not present

## 2018-02-21 DIAGNOSIS — Z8673 Personal history of transient ischemic attack (TIA), and cerebral infarction without residual deficits: Secondary | ICD-10-CM | POA: Diagnosis not present

## 2018-02-21 DIAGNOSIS — I25810 Atherosclerosis of coronary artery bypass graft(s) without angina pectoris: Secondary | ICD-10-CM | POA: Diagnosis not present

## 2018-02-21 DIAGNOSIS — Z9889 Other specified postprocedural states: Secondary | ICD-10-CM | POA: Diagnosis not present

## 2018-02-21 DIAGNOSIS — R55 Syncope and collapse: Secondary | ICD-10-CM | POA: Diagnosis not present

## 2018-02-28 DIAGNOSIS — H5212 Myopia, left eye: Secondary | ICD-10-CM | POA: Diagnosis not present

## 2018-02-28 DIAGNOSIS — H40013 Open angle with borderline findings, low risk, bilateral: Secondary | ICD-10-CM | POA: Diagnosis not present

## 2018-02-28 DIAGNOSIS — H524 Presbyopia: Secondary | ICD-10-CM | POA: Diagnosis not present

## 2018-02-28 DIAGNOSIS — H52203 Unspecified astigmatism, bilateral: Secondary | ICD-10-CM | POA: Diagnosis not present

## 2018-02-28 DIAGNOSIS — H5201 Hypermetropia, right eye: Secondary | ICD-10-CM | POA: Diagnosis not present

## 2018-03-03 DIAGNOSIS — R55 Syncope and collapse: Secondary | ICD-10-CM | POA: Diagnosis not present

## 2018-03-08 ENCOUNTER — Telehealth: Payer: Self-pay | Admitting: *Deleted

## 2018-03-08 ENCOUNTER — Encounter (INDEPENDENT_AMBULATORY_CARE_PROVIDER_SITE_OTHER): Payer: Self-pay | Admitting: Orthopedic Surgery

## 2018-03-08 ENCOUNTER — Ambulatory Visit (INDEPENDENT_AMBULATORY_CARE_PROVIDER_SITE_OTHER): Payer: Medicare Other | Admitting: Orthopedic Surgery

## 2018-03-08 DIAGNOSIS — M542 Cervicalgia: Secondary | ICD-10-CM

## 2018-03-08 NOTE — Telephone Encounter (Signed)
I r/c medical records from [facility], no cd yet.

## 2018-03-11 ENCOUNTER — Encounter (INDEPENDENT_AMBULATORY_CARE_PROVIDER_SITE_OTHER): Payer: Self-pay | Admitting: Orthopedic Surgery

## 2018-03-11 NOTE — Progress Notes (Signed)
Office Visit Note   Patient: [name] [name]           Date of Birth: February 14, 1929           MRN: [id number] Visit Date: 03/08/2018 Requested by: Shirline Frees, MD [place], [place] [address] PCP: Shirline Frees, MD  Subjective: Chief Complaint  Patient presents with  . Neck - Follow-up    HPI: Michial is an 85 year old patient with neck pain.  Here for 3-week follow-up on the neck pain.  We put him on a Medrol Dosepak and muscle relaxer.  That improved his neck pain significantly.  Last office visit 02/15/2018.  He is about 3 months out from his shoulder reverse replacement.  He is doing well with his shoulder.              ROS: All systems reviewed are negative as they relate to the chief complaint within the history of present illness.  Patient denies  fevers or chills.   Assessment & Plan: Visit Diagnoses:  1. Neck pain     Plan: Impression is resolved neck pain with Medrol Dosepak and muscle relaxer.  In general he is feeling better with that.  Shoulder still remains very functional with good forward flexion abduction both above 90 degrees.  He is able to help his wife at this time.  I am going to release him at this time and I will see him back as needed.  Follow-Up Instructions: Return if symptoms worsen or fail to improve.   Orders:  No orders of the defined types were placed in this encounter.  No orders of the defined types were placed in this encounter.     Procedures: No procedures performed   Clinical Data: No additional findings.  Objective: Vital Signs: There were no vitals taken for this visit.  Physical Exam:   Constitutional: Patient appears well-developed HEENT:  Head: Normocephalic Eyes:EOM are normal Neck: Normal range of motion Cardiovascular: Normal rate Pulmonary/chest: Effort normal Neurologic: Patient is alert Skin: Skin is warm Psychiatric: Patient has normal mood and affect    Ortho Exam: Ortho exam  demonstrates good cervical spine range of motion without referring pain symptoms to the shoulders or scapula.  Good shoulder range of motion on the left with functional deltoid strength in the left and good motor sensory function to the hand.  No paresthesias C5-T1.  Specialty Comments:  No specialty comments available.  Imaging: No results found.   PMFS History: Patient Active Problem List   Diagnosis Date Noted  . Shoulder arthritis 12/13/2017  . Rotator cuff arthropathy of left shoulder   . S/P MVR (mitral valve repair) 10/11/2011  . S/P CABG (coronary artery bypass graft) 10/11/2011  . Ventricular tachycardia ([place]) 08/25/2011  . S/P mitral valve repair 08/19/2011  . S/P CABG x 2 08/19/2011  . Thyroid nodule 08/18/2011  . Severe mitral regurgitation 08/12/2011  . CHF (congestive heart failure) ([place])   . MR (mitral regurgitation)   . Cataract   . Dermatochalasis   . Hypercholesterolemia   . Systolic murmur   . Nephrolithiasis   . PVC (premature ventricular contraction)   . Pseudophakia   . Coronary artery disease 08/10/2011   Past Medical History:  Diagnosis Date  . Arthritis    hands  . Cancer ([place])    colon cancer 1984  . Cataract   . CHF (congestive heart failure) (HCC)    takes Lasix daily  . Coronary artery disease 08/10/2011  .  Dermatochalasis   . Diverticulosis   . Dyslipidemia    takes Niacin daily  . GERD (gastroesophageal reflux disease)    sometimes d/t food  . H/O hiatal hernia   . History of colon polyps   . History of kidney stones   . History of seasonal allergies    takes OTC allergy meds prn  . Hx: recurrent pneumonia   . Hypercholesterolemia   . MR (mitral regurgitation)   . MVP (mitral valve prolapse)   . Pneumonia    early July 2013  . Pseudophakia   . PVC (premature ventricular contraction)   . S/P CABG x 2 08/19/2011   LIMA to LAD, SVG to RCA, EVH via left thigh  . S/P mitral valve repair 08/19/2011   Complex valvuloplasty including  triangular resection of posterior leaflet, artificial Goretex neocord placement x6 and 41mm Sorin Memo 3D ring annuloplasty  . Shortness of breath    with exertion/lying/sitting  . Systolic murmur   . Thyroid nodule 08/18/2011   3 cm nodule discovered on chest CT scan  . Urinary frequency   . Urinary urgency     Family History  Problem Relation Age of Onset  . Cancer Mother   . Heart disease Father   . Glaucoma Sister   . Prostate cancer Other        Fraternal HX     Past Surgical History:  Procedure Laterality Date  . BUNIONECTOMY    . CARDIAC CATHETERIZATION  08/10/11  . CATARACT EXTRACTION     bilateral  . COLONOSCOPY    . CORONARY ARTERY BYPASS GRAFT  08/19/2011   Procedure: CORONARY ARTERY BYPASS GRAFTING (CABG);  Surgeon: Rexene Alberts, MD;  Location: [facility];  Service: Open Heart Surgery;  Laterality: N/A;  Coronary Artery Bypass Grafting times two using left internal mammary artery and left greater saphenous vein endoscopiclly harvested  . CYSTOSCOPY     with manipulation of Ureteral Calculus  . ESOPHAGOGASTRODUODENOSCOPY     with dilitation  . HERNIA REPAIR    . LITHOTRIPSY     Whole body extracorporeal shock wave  . LITHOTRIPSY     Left ESL PUA GPE 01/03/2006,04/03/2006  . MITRAL VALVE REPAIR  08/19/2011   Procedure: MITRAL VALVE REPAIR (MVR);  Surgeon: Rexene Alberts, MD;  Location: [facility];  Service: Open Heart Surgery;  Laterality: N/A;  . partial colecotomy  1984  . REVERSE SHOULDER ARTHROPLASTY Left 12/13/2017   Procedure: LEFT REVERSE SHOULDER ARTHROPLASTY;  Surgeon: Meredith Pel, MD;  Location: [facility];  Service: Orthopedics;  Laterality: Left;   Social History   Occupational History  . Occupation: retired, Chief Financial Officer [employer]  Tobacco Use  . Smoking status: Former Smoker    Types: Cigarettes  . Smokeless tobacco: Never Used  . Tobacco comment: quit 69yrs ago  Substance and Sexual Activity  . Alcohol use: No  . Drug use: No  . Sexual activity: Yes

## 2018-03-13 DIAGNOSIS — R600 Localized edema: Secondary | ICD-10-CM | POA: Diagnosis not present

## 2018-03-13 DIAGNOSIS — N183 Chronic kidney disease, stage 3 (moderate): Secondary | ICD-10-CM | POA: Diagnosis not present

## 2018-03-13 DIAGNOSIS — I2510 Atherosclerotic heart disease of native coronary artery without angina pectoris: Secondary | ICD-10-CM | POA: Diagnosis not present

## 2018-03-13 DIAGNOSIS — I480 Paroxysmal atrial fibrillation: Secondary | ICD-10-CM | POA: Diagnosis not present

## 2018-03-13 DIAGNOSIS — E7800 Pure hypercholesterolemia, unspecified: Secondary | ICD-10-CM | POA: Diagnosis not present

## 2018-03-15 ENCOUNTER — Ambulatory Visit (INDEPENDENT_AMBULATORY_CARE_PROVIDER_SITE_OTHER): Payer: Medicare Other | Admitting: Orthopedic Surgery

## 2018-04-18 ENCOUNTER — Telehealth: Payer: Self-pay | Admitting: Neurology

## 2018-04-18 NOTE — Telephone Encounter (Signed)
[name] [name], patient has asked me multiple ti,es to come to [facility] for a history of stroke. I have explained to him that he needs a referral from pcp and that he would see Dr. Leonie Man first who likes to see all our new stroke patients if possible. He could then gladly transition to me. The last time he was here, he was upset we had not taken care of this but I'm not sure he is grasping the need for referral. Would u reach out to his pcp and ask for a referral, explain the situation?

## 2018-04-18 NOTE — Telephone Encounter (Signed)
Reached out to Dr. Kenton Kingfisher' office and they are working on getting a referral sent over to [place]. Will call and schedule pt once received.

## 2018-04-19 NOTE — Telephone Encounter (Signed)
Thank you :)

## 2018-05-15 DIAGNOSIS — Z7901 Long term (current) use of anticoagulants: Secondary | ICD-10-CM | POA: Diagnosis not present

## 2018-05-15 DIAGNOSIS — I5020 Unspecified systolic (congestive) heart failure: Secondary | ICD-10-CM | POA: Diagnosis not present

## 2018-05-15 DIAGNOSIS — Z9889 Other specified postprocedural states: Secondary | ICD-10-CM | POA: Diagnosis not present

## 2018-05-15 DIAGNOSIS — I480 Paroxysmal atrial fibrillation: Secondary | ICD-10-CM | POA: Diagnosis not present

## 2018-05-15 DIAGNOSIS — Z951 Presence of aortocoronary bypass graft: Secondary | ICD-10-CM | POA: Diagnosis not present

## 2018-06-27 ENCOUNTER — Telehealth: Payer: Self-pay

## 2018-06-27 NOTE — Telephone Encounter (Signed)
Pt returned called in and stated he provides consent for video visit , link sent to cell phone pt states he has received .  [email or web address]

## 2018-06-27 NOTE — Telephone Encounter (Signed)
Left vm for patient that due to the [place] 19 pandemic we are only doing video visit. I stated to call back for further instructions.

## 2018-07-03 ENCOUNTER — Ambulatory Visit (INDEPENDENT_AMBULATORY_CARE_PROVIDER_SITE_OTHER): Payer: Medicare Other | Admitting: Neurology

## 2018-07-03 ENCOUNTER — Other Ambulatory Visit: Payer: Self-pay

## 2018-07-03 ENCOUNTER — Encounter: Payer: Self-pay | Admitting: Neurology

## 2018-07-03 DIAGNOSIS — I639 Cerebral infarction, unspecified: Secondary | ICD-10-CM | POA: Insufficient documentation

## 2018-07-03 NOTE — Progress Notes (Signed)
Virtual Visit via Video Note  I connected with [name] [name] on 07/03/18 at 10:00 AM EDT by a video enabled telemedicine application and verified that I am speaking with the correct person using two identifiers.  Location: Patient: at home  Provider: at [facility]   I discussed the limitations of evaluation and management by telemedicine and the availability of in person appointments. The patient expressed understanding and agreed to proceed.  This visit was performed using [email or web address] app for audio and visual  History of Present Illness: [name] [name] is a pleasant 85 year old Caucasian male seen today for initial virtual video consultation visit for stroke.  History is obtained from the patient and review of electronic medical records.  I personally reviewed imaging films in PACS.  He states that he is not had any clinical symptoms of stroke or TIA but had an MRI scan on 12/23/2016 which showed remote bilateral cerebellar infarcts.  I have personally reviewed the images in PACS.  MRA showed hypoplastic right vertebral artery but patent dominant left vertebral arteries.  No significant carotid stenosis.  He is states he saw a neurologist in [place] who is unable to name.  Patient has been on long-term anticoagulation for history of paroxysmal A. fib which initially started following CABG surgery in 2013.  He was initially on warfarin but in recent years has been on Eliquis.  He is tolerating Eliquis well without bleeding or bruising.  He was on Zocor for 7 years and was recently switched by his primary care physician to Pravachol 40 mg due to concerns about possible interaction with amiodarone.  He denies any slurred speech, headache, extremity weakness, gait or balance problems.  Is tolerating Pravachol well without muscle aches and pains.  He states he had a carotid ultrasound done last year at his cardiologist office in [facility] but I do not have those results.        Review of the primary  care physician's referral note states that he had lipid profile checked earlier this year which was satisfactory.  He has no new neurological complaints.  His wife is a patient of Dr. Jaynee Eagles and patient also prefers to follow-up with her in the future if needed  No prior history of strokes, TIAs, migraines, seizures or loss of consciousness. Past medical history hyperlipidemia, mitral valve repair, colon cancer, kidney stones, coronary artery bypass surgery, postop A. Fib. Medication list Eliquis 2.5 mg twice daily.  Amiodarone 100 mg daily.  Aspirin 81 mg daily.  Klor-Con 20 mEq twice daily.  Pravastatin 40 mg daily.  Fish oil 1200 mg twice daily.  Goniovisc 2.5% 1 drop in both eyes for dry eyes. Allergies IVP dye.  Niacin causes rash.  I would dine causes rash. Social history patient is married.  Lives with his wife.  Does not smoke.  Does not drink alcohol. 14 system review of systems is negative except for as documented above in history of present illness. Observations/Objective: Physical and neurological exam is limited due to constraints from virtual video visit.  Pleasant elderly Caucasian male who is mildly obese.  Not in distress.  He is awake alert oriented to time place and person.  Speech and language appear normal.  No dysarthria or aphasia.  Extraocular movements are full range without nystagmus.  Face appears symmetric without weakness.  Tongue is midline.  Motor system exam reveals symmetric upper and lower extremity strength without focal weakness.  He can stand on either foot unsupported and on his heels and  toes.  Gait is steady.  Assessment and Plan: 85 year old male with remote history of bilateral cerebellar infarcts which clinically silent and of undetermined age perhaps related to perioperative ischemic injuries following CABG in 2013 or silent infarcts related to his paroxysmal A. fib.  He is doing clinically well without any focal symptoms or deficits.  Vascular risk factors of  atrial fibrillation, coronary artery disease, hyperlipidemia and mild obesity. I recommend he continue Eliquis for secondary stroke prevention for his A. fib as well as maintain strict control of hyperlipidemia with LDL cholesterol goal below 70 mg percent and hypertension with blood pressure goal below 130/90 and sugar with hemoglobin A1c goal below 6.5%.  I encouraged him to eat a healthy diet with lots of fruits, vegetables, cereals, whole grains and to be active and exercise 30 minutes daily.  He will continue follow-up with his primary care physician.  No schedule routine follow-up with me is necessary but he may follow-up in the future with Dr. Jaynee Eagles whom he prefers to see in my office if needed as his wife is a patient as well.  Follow Up Instructions:    I discussed the assessment and treatment plan with the patient. The patient was provided an opportunity to ask questions and all were answered. The patient agreed with the plan and demonstrated an understanding of the instructions.   The patient was advised to call back or seek an in-person evaluation if the symptoms worsen or if the condition fails to improve as anticipated.  I provided 45 minutes of non-face-to-face time during this encounter.   Antony Contras, MD

## 2018-07-05 ENCOUNTER — Telehealth: Payer: Self-pay | Admitting: Neurology

## 2018-07-05 NOTE — Telephone Encounter (Signed)
He is welcome to follow up with me. Looks like he is pretty stable with his stroke history I would recommend a follow up in one year with me. We could also do 6 months but I prefer 9 months or a year since he is so stable but whatever he is comfortable with, thanks  Lovena Le, do you mind giving him a call?

## 2018-07-05 NOTE — Telephone Encounter (Signed)
Pt called wanting to know what the next step is for him to be able to transfer to Dr. Jaynee Eagles. Please advise.

## 2018-07-05 NOTE — Telephone Encounter (Signed)
Agree with plan 

## 2018-07-06 NOTE — Telephone Encounter (Signed)
I called patient and got him scheduled with Dr. Jaynee Eagles for next June 2021. I told patient to call if at any point he feels he needs a sooner appt.

## 2018-07-19 DIAGNOSIS — E041 Nontoxic single thyroid nodule: Secondary | ICD-10-CM | POA: Diagnosis not present

## 2018-10-12 DIAGNOSIS — I2510 Atherosclerotic heart disease of native coronary artery without angina pectoris: Secondary | ICD-10-CM | POA: Diagnosis not present

## 2018-10-12 DIAGNOSIS — R3911 Hesitancy of micturition: Secondary | ICD-10-CM | POA: Diagnosis not present

## 2018-10-12 DIAGNOSIS — Z1211 Encounter for screening for malignant neoplasm of colon: Secondary | ICD-10-CM | POA: Diagnosis not present

## 2018-10-12 DIAGNOSIS — I480 Paroxysmal atrial fibrillation: Secondary | ICD-10-CM | POA: Diagnosis not present

## 2018-10-12 DIAGNOSIS — R600 Localized edema: Secondary | ICD-10-CM | POA: Diagnosis not present

## 2018-10-12 DIAGNOSIS — N183 Chronic kidney disease, stage 3 (moderate): Secondary | ICD-10-CM | POA: Diagnosis not present

## 2018-10-12 DIAGNOSIS — N401 Enlarged prostate with lower urinary tract symptoms: Secondary | ICD-10-CM | POA: Diagnosis not present

## 2018-10-12 DIAGNOSIS — E7800 Pure hypercholesterolemia, unspecified: Secondary | ICD-10-CM | POA: Diagnosis not present

## 2018-10-12 DIAGNOSIS — Z0000 Encounter for general adult medical examination without abnormal findings: Secondary | ICD-10-CM | POA: Diagnosis not present

## 2018-10-19 DIAGNOSIS — Z1211 Encounter for screening for malignant neoplasm of colon: Secondary | ICD-10-CM | POA: Diagnosis not present

## 2018-10-26 DIAGNOSIS — E7800 Pure hypercholesterolemia, unspecified: Secondary | ICD-10-CM | POA: Diagnosis not present

## 2018-10-26 DIAGNOSIS — N401 Enlarged prostate with lower urinary tract symptoms: Secondary | ICD-10-CM | POA: Diagnosis not present

## 2018-10-26 DIAGNOSIS — I2510 Atherosclerotic heart disease of native coronary artery without angina pectoris: Secondary | ICD-10-CM | POA: Diagnosis not present

## 2018-10-26 DIAGNOSIS — I480 Paroxysmal atrial fibrillation: Secondary | ICD-10-CM | POA: Diagnosis not present

## 2018-10-26 DIAGNOSIS — N183 Chronic kidney disease, stage 3 unspecified: Secondary | ICD-10-CM | POA: Diagnosis not present

## 2018-10-26 DIAGNOSIS — Z85038 Personal history of other malignant neoplasm of large intestine: Secondary | ICD-10-CM | POA: Diagnosis not present

## 2018-11-15 DIAGNOSIS — R195 Other fecal abnormalities: Secondary | ICD-10-CM | POA: Diagnosis not present

## 2018-11-15 DIAGNOSIS — Z85038 Personal history of other malignant neoplasm of large intestine: Secondary | ICD-10-CM | POA: Diagnosis not present

## 2018-11-24 DIAGNOSIS — Z23 Encounter for immunization: Secondary | ICD-10-CM | POA: Diagnosis not present

## 2018-12-01 DIAGNOSIS — N183 Chronic kidney disease, stage 3 unspecified: Secondary | ICD-10-CM | POA: Diagnosis not present

## 2018-12-01 DIAGNOSIS — E7800 Pure hypercholesterolemia, unspecified: Secondary | ICD-10-CM | POA: Diagnosis not present

## 2018-12-01 DIAGNOSIS — N401 Enlarged prostate with lower urinary tract symptoms: Secondary | ICD-10-CM | POA: Diagnosis not present

## 2018-12-01 DIAGNOSIS — Z85038 Personal history of other malignant neoplasm of large intestine: Secondary | ICD-10-CM | POA: Diagnosis not present

## 2018-12-01 DIAGNOSIS — I480 Paroxysmal atrial fibrillation: Secondary | ICD-10-CM | POA: Diagnosis not present

## 2018-12-01 DIAGNOSIS — I2510 Atherosclerotic heart disease of native coronary artery without angina pectoris: Secondary | ICD-10-CM | POA: Diagnosis not present

## 2018-12-11 DIAGNOSIS — I5022 Chronic systolic (congestive) heart failure: Secondary | ICD-10-CM | POA: Diagnosis not present

## 2018-12-11 DIAGNOSIS — I5020 Unspecified systolic (congestive) heart failure: Secondary | ICD-10-CM | POA: Diagnosis not present

## 2018-12-11 DIAGNOSIS — I25810 Atherosclerosis of coronary artery bypass graft(s) without angina pectoris: Secondary | ICD-10-CM | POA: Diagnosis not present

## 2018-12-11 DIAGNOSIS — I480 Paroxysmal atrial fibrillation: Secondary | ICD-10-CM | POA: Diagnosis not present

## 2018-12-11 DIAGNOSIS — I519 Heart disease, unspecified: Secondary | ICD-10-CM | POA: Diagnosis not present

## 2018-12-11 IMAGING — CT CT SHOULDER*L* W/O CM
1 of 2 series · 9 of 14 positions shown, 12 images · non-contrast
Comparison: None.

CLINICAL DATA: Left shoulder pain.

EXAM:
CT OF THE UPPER LEFT EXTREMITY WITHOUT CONTRAST
TECHNIQUE: Multidetector CT imaging of the upper left extremity was performed
according to the standard protocol.

[Series 5: thin soft · axial · 0.52mm/px · z∈[-255,-67]mm · 9 of 394 slices shown, 12 images]
[im 40/394  soft-tissue]
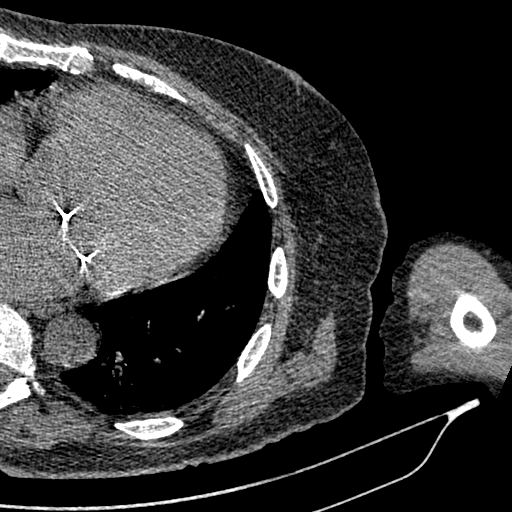
[im 40/394  bone]
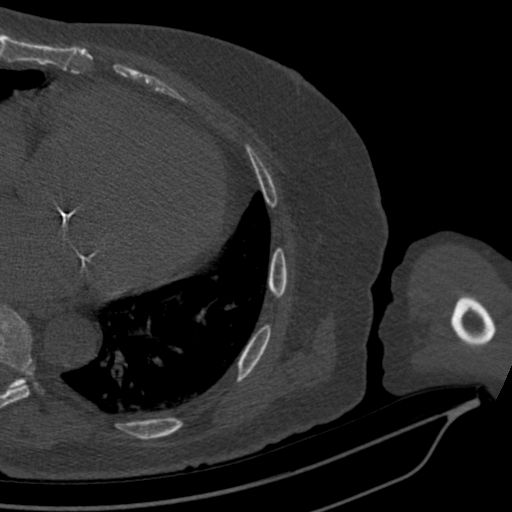
[im 79/394  bone]
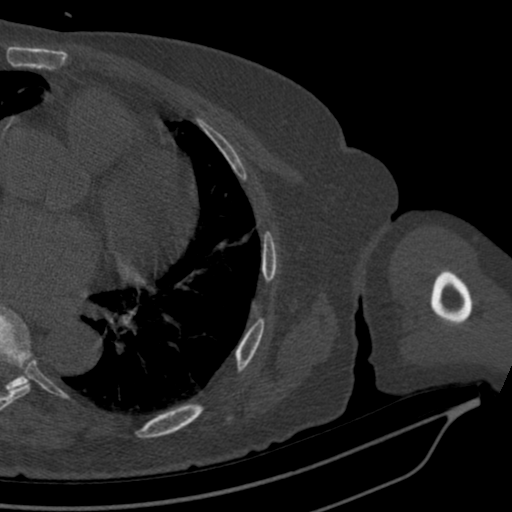
[im 118/394  bone]
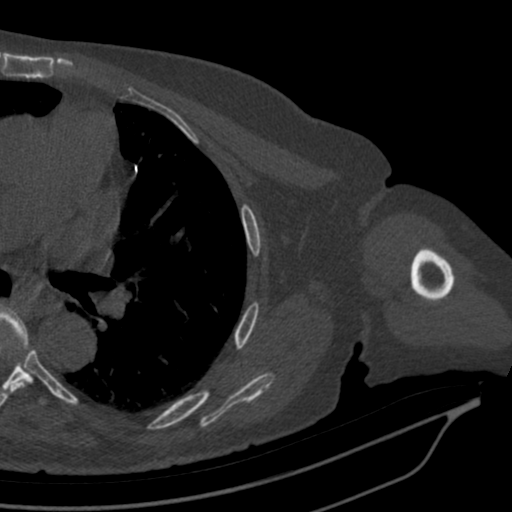
[im 158/394  bone]
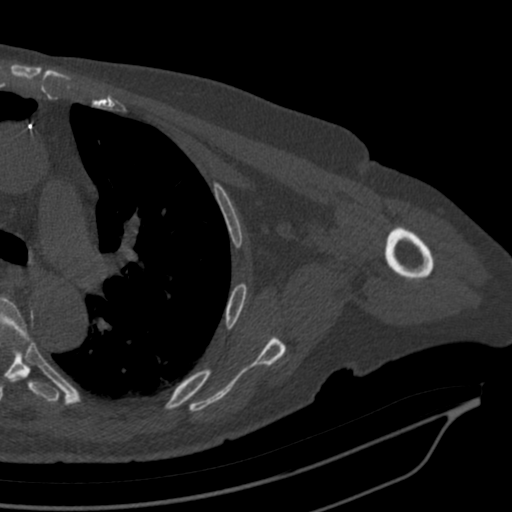
[im 197/394  soft-tissue]
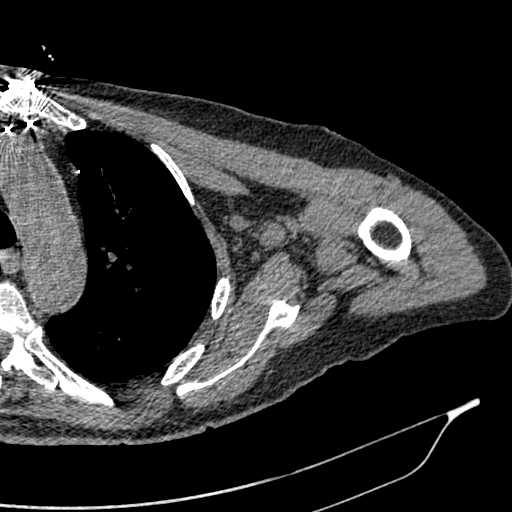
[im 197/394  bone]
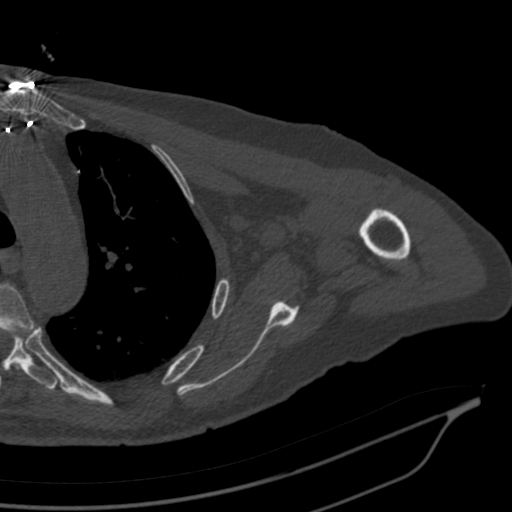
[im 236/394  bone]
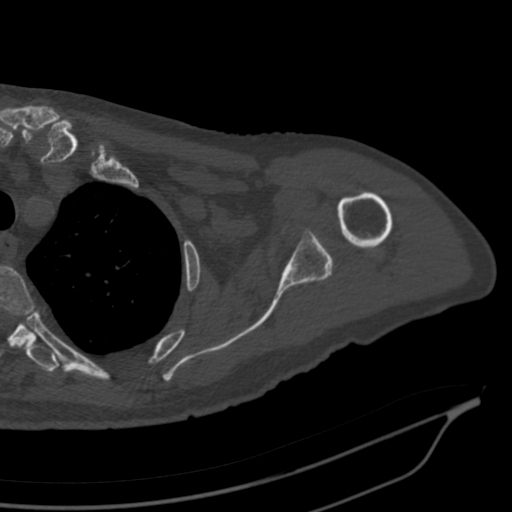
[im 276/394  bone]
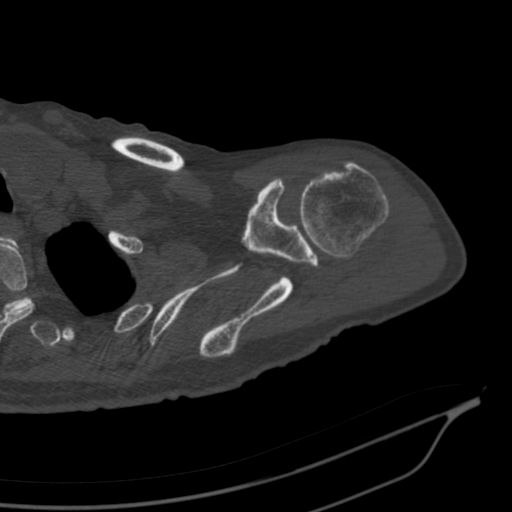
[im 315/394  bone]
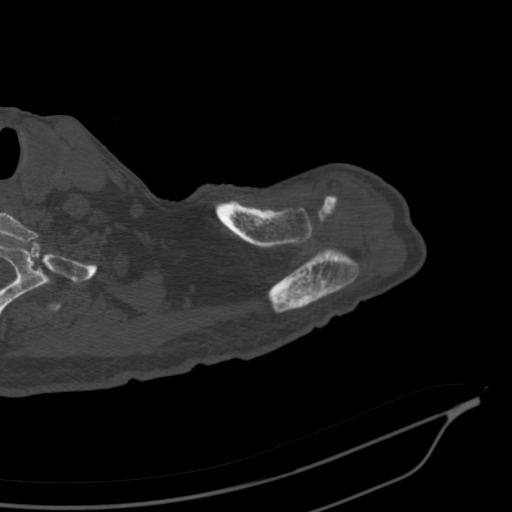
[im 354/394  soft-tissue]
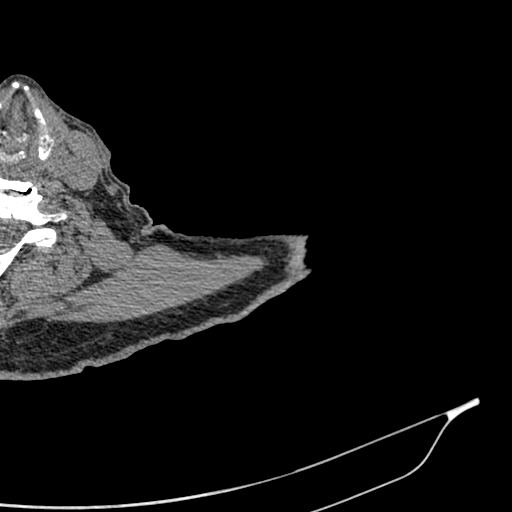
[im 354/394  bone]
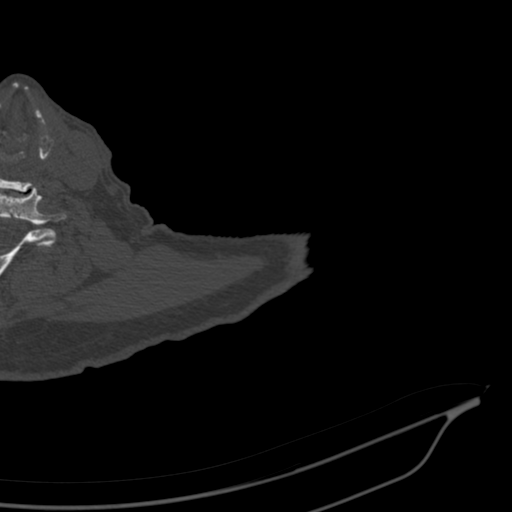

[9 of 14 positions shown; findings below may reference images not displayed]

FINDINGS: Bones/Joint/Cartilage

No fracture or dislocation. Normal alignment. No joint effusion.

Mild arthropathy of the acromioclavicular joint. Loss of the normal
acromiohumeral distance as can be seen with a
supraspinatus/infraspinatus rotator cuff tear.

Ligaments

Ligaments are suboptimally evaluated by CT.

Muscles and Tendons
Muscles are normal.  No muscle atrophy.

Soft tissue
No fluid collection or hematoma. Indeterminate 4.6 x 3.4 cm left
thyroid cystic mass enlarged compared with 08/18/2011. Recommend
further evaluation with a thyroid ultrasound. Visualized right lung
is clear.
IMPRESSION: 1. Loss of the normal acromiohumeral distance as can be seen with a
supraspinatus/infraspinatus rotator cuff tear.
2. 4.6 x 3.4 cm left thyroid cystic mass enlarged compared with
08/18/2011. Recommend further evaluation with a thyroid ultrasound.

## 2019-04-01 IMAGING — DX DG SHOULDER 1V*L*
1 series · 1 of 1 positions shown · non-contrast
Comparison: CT scan dated 08/24/2017

CLINICAL DATA: Arthritis of the left shoulder. Status post left
reverse shoulder arthroplasty.

EXAM:
LEFT SHOULDER - 1 VIEW

[shoulder ap]
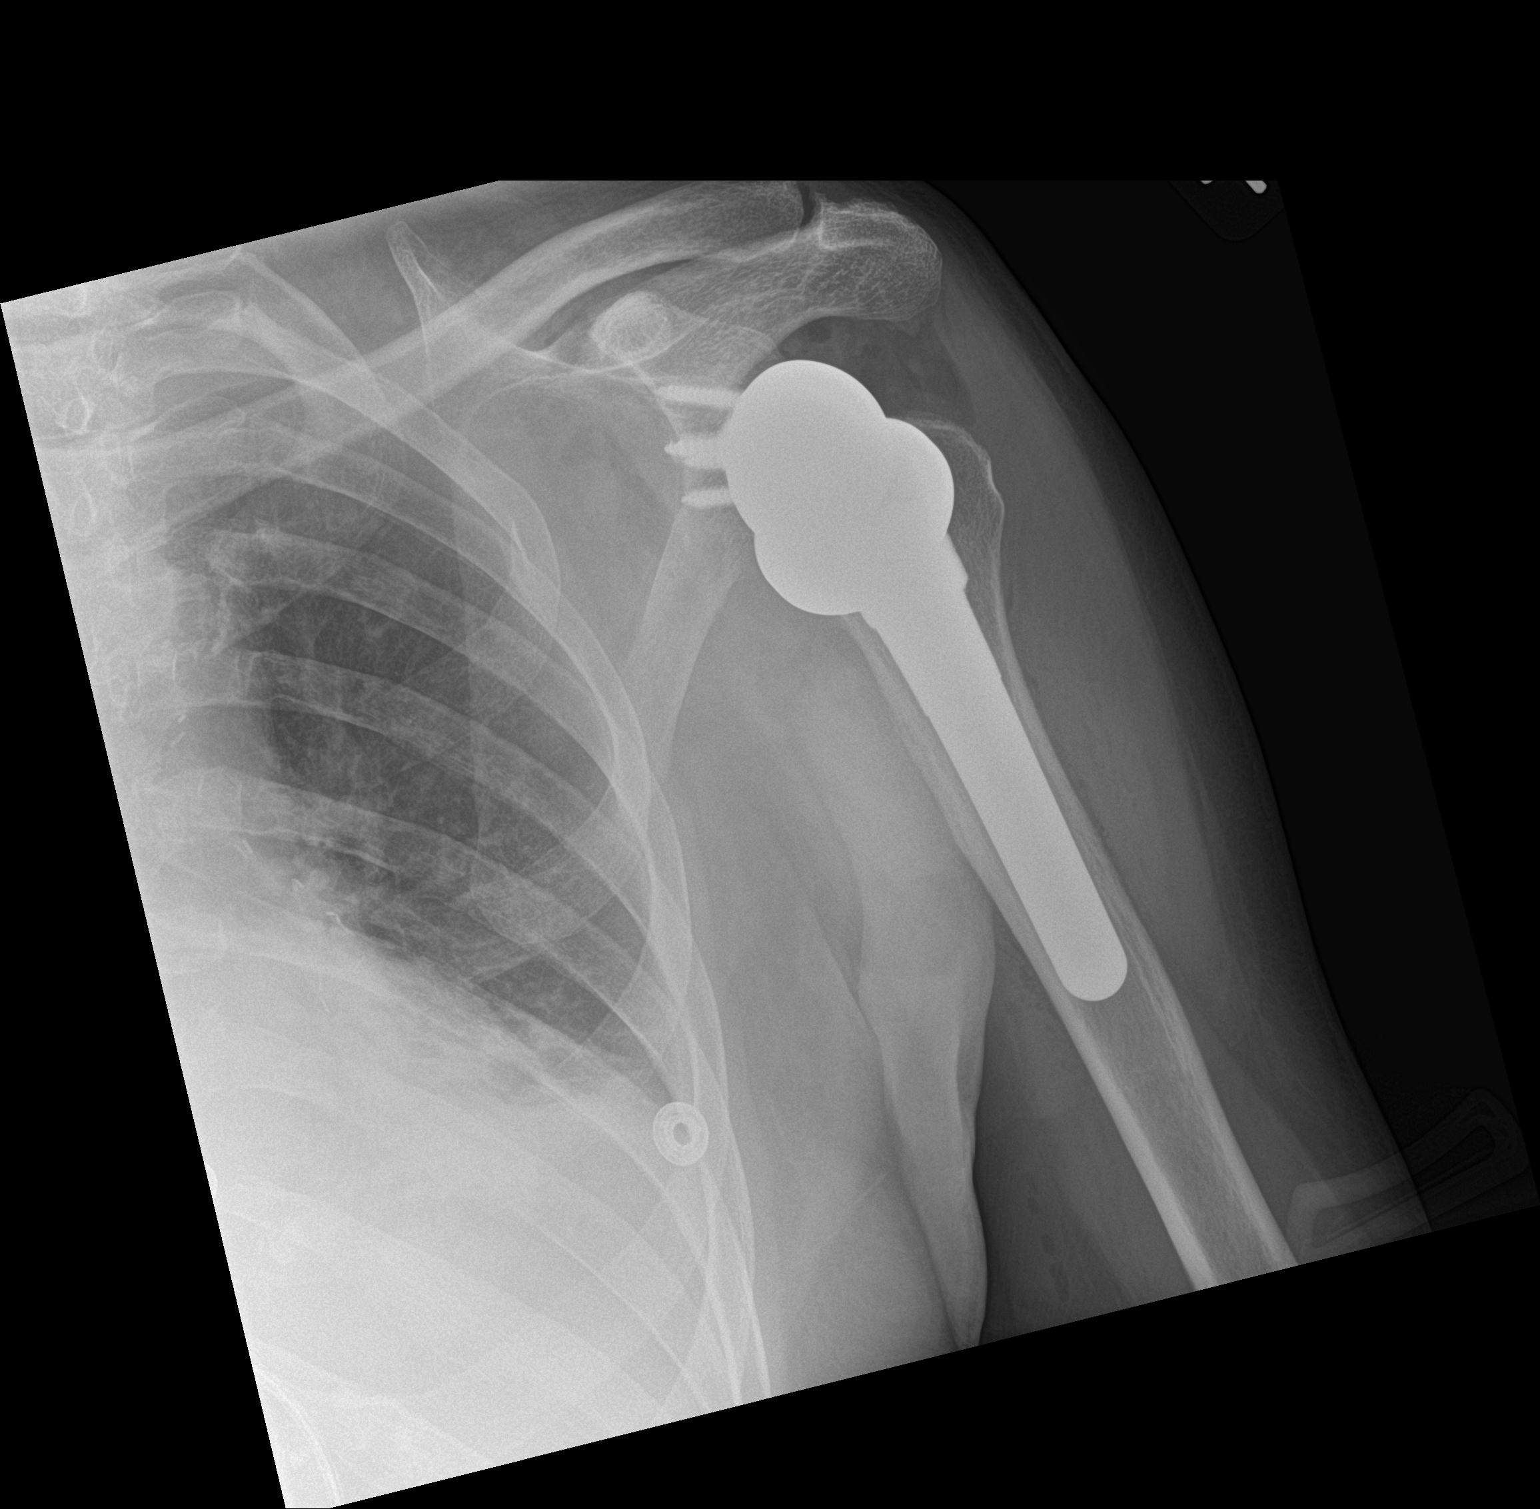

[1 of 1 positions shown; findings below may reference images not displayed]

FINDINGS: Single AP view of the shoulder demonstrates that the acetabular and
humeral components of the reversed shoulder prosthesis appear in
good position in this projection. No fractures.

Slight atelectasis at the left lung base.
IMPRESSION: Satisfactory appearance of the left shoulder in the AP projection
after reversed shoulder arthroplasty.

Atelectasis at the left lung base.

## 2019-04-17 ENCOUNTER — Telehealth: Payer: Self-pay | Admitting: Neurology

## 2019-04-17 NOTE — Telephone Encounter (Signed)
I called patient and LVM to reschedule d/t MD out of office on 6/21. Requested patient call back to reschedule.

## 2019-06-14 DIAGNOSIS — R040 Epistaxis: Secondary | ICD-10-CM | POA: Diagnosis not present

## 2019-06-14 DIAGNOSIS — E041 Nontoxic single thyroid nodule: Secondary | ICD-10-CM | POA: Diagnosis not present

## 2019-06-18 DIAGNOSIS — R319 Hematuria, unspecified: Secondary | ICD-10-CM | POA: Diagnosis not present

## 2019-06-18 DIAGNOSIS — N201 Calculus of ureter: Secondary | ICD-10-CM | POA: Diagnosis not present

## 2019-06-18 DIAGNOSIS — K8020 Calculus of gallbladder without cholecystitis without obstruction: Secondary | ICD-10-CM | POA: Diagnosis not present

## 2019-06-18 DIAGNOSIS — R1032 Left lower quadrant pain: Secondary | ICD-10-CM | POA: Diagnosis not present

## 2019-06-18 DIAGNOSIS — R1012 Left upper quadrant pain: Secondary | ICD-10-CM | POA: Diagnosis not present

## 2019-06-20 DIAGNOSIS — R310 Gross hematuria: Secondary | ICD-10-CM | POA: Diagnosis not present

## 2019-06-20 DIAGNOSIS — N201 Calculus of ureter: Secondary | ICD-10-CM | POA: Diagnosis not present

## 2019-07-02 DIAGNOSIS — R310 Gross hematuria: Secondary | ICD-10-CM | POA: Diagnosis not present

## 2019-07-02 DIAGNOSIS — N201 Calculus of ureter: Secondary | ICD-10-CM | POA: Diagnosis not present

## 2019-07-09 ENCOUNTER — Ambulatory Visit: Payer: Medicare Other | Admitting: Neurology

## 2019-07-17 ENCOUNTER — Ambulatory Visit (INDEPENDENT_AMBULATORY_CARE_PROVIDER_SITE_OTHER): Payer: Medicare Other | Admitting: Neurology

## 2019-07-17 ENCOUNTER — Other Ambulatory Visit: Payer: Self-pay

## 2019-07-17 ENCOUNTER — Telehealth: Payer: Self-pay | Admitting: Neurology

## 2019-07-17 ENCOUNTER — Encounter: Payer: Self-pay | Admitting: Neurology

## 2019-07-17 VITALS — BP 144/71 | HR 64 | Ht 67.5 in | Wt 162.0 lb

## 2019-07-17 DIAGNOSIS — R55 Syncope and collapse: Secondary | ICD-10-CM

## 2019-07-17 DIAGNOSIS — I639 Cerebral infarction, unspecified: Secondary | ICD-10-CM | POA: Diagnosis not present

## 2019-07-17 DIAGNOSIS — W19XXXA Unspecified fall, initial encounter: Secondary | ICD-10-CM

## 2019-07-17 NOTE — Telephone Encounter (Signed)
Medicare/bcbs supp order sent to GI. No auth they will reach out to the patient to schedule.  

## 2019-07-17 NOTE — Progress Notes (Signed)
[facility]    Provider:  Dr Jaynee Eagles Requesting Provider: Shirline Frees, MD Primary Care Provider:  Shirline Frees, MD  CC:  Stroke  HPI:  [name] [name] is a 86 y.o. male here as a follow up by Shirline Frees, MD for stroke.  He has a past medical history of hyperlipidemia, mitral valve repair, colon cancer, kidney stones, coronary artery bypass, atrial fibrillation.  Patient has not had any clinical symptoms or strokes of TIA the last had an MRI in 2018; I reviewed images which showed remote bilateral cerebellar infarcts.  I reviewed prior notes from Dr. Leonie Man and reviewed images from MRI and agree with radiology notes and also MRI images which showed hypoplastic right vertebral artery but no significant carotid stenosis and he is on long-term anticoagulation for paroxysmal A. fib which initially started following CABG surgery in 2013; strokes could be due to A. fib or post procedure and he has been stable since.  He tolerates Eliquis well.Marland Kitchen   He is here today and he reports that he he goes to see his wife in [place] in hospice for Frontotemporal Dementia, he visits 2-3x a week. He feels well, no stroke symptoms or any new symptoms except he had a syncopal event. He has been overall happy with [facility] and hospice comes daily M-F to help with care. One Sunday after lunch he was standing and all of a sudden fell backwards. Not lightheaded, he lost balance unknown why, unknown why he fell backwards, unknown loss of consciousness, he denied feeling dizzy or seizure activity, no headaches, no vomiting, no chest, no problems since then. Lately he feels a little dizzy when he stands up or walking. He doesn't drink a lot fluids during the day. He shows me a list of his blood pressures and his systolic is sometimes as low as 98 or 105. He is a poor historian. He had a kidney stone not long ago. Dizziness started the last month.   Reviewed notes, labs and imaging from  outside physicians, which showed: See above.  Review of Systems: Patient complains of symptoms per HPI as well as the following symptoms: Recent episode with loss of consciousness and fall, no new neurologic complaints, no prior history of strokes, TIAs, migraines, seizures and up until recently no loss of consciousness. Pertinent negatives and positives per HPI. All others negative.   Social History   Socioeconomic History  . Marital status: Married    Spouse name: Not on file  . Number of children: 2  . Years of education: Not on file  . Highest education level: Not on file  Occupational History  . Occupation: retired, Chief Financial Officer [employer]  Tobacco Use  . Smoking status: Former Smoker    Types: Cigarettes  . Smokeless tobacco: Never Used  . Tobacco comment: quit 61yrs ago; pt states he smoked only 5 months.  Vaping Use  . Vaping Use: Never used  Substance and Sexual Activity  . Alcohol use: No  . Drug use: No  . Sexual activity: Yes  Other Topics Concern  . Not on file  Social History Narrative   Daughter lives with pt   Right handed   Caffeine: 1 cup max in a day    Social Determinants of Health   Financial Resource Strain:   . Difficulty of Paying Living Expenses:   Food Insecurity:   . Worried About Charity fundraiser in the Last Year:   . [place] in the Last  Year:   Transportation Needs:   . Film/video editor (Medical):   Marland Kitchen Lack of Transportation (Non-Medical):   Physical Activity:   . Days of Exercise per Week:   . Minutes of Exercise per Session:   Stress:   . Feeling of Stress :   Social Connections:   . Frequency of Communication with Friends and Family:   . Frequency of Social Gatherings with Friends and Family:   . Attends Religious Services:   . Active Member of Clubs or Organizations:   . Attends Archivist Meetings:   Marland Kitchen Marital Status:   Intimate Partner Violence:   . Fear of Current or Ex-Partner:   . Emotionally Abused:   Marland Kitchen  Physically Abused:   . Sexually Abused:     Family History  Problem Relation Age of Onset  . Cancer Mother   . Heart disease Father   . Heart attack Father   . Glaucoma Sister   . Prostate cancer Other        Fraternal HX   . Stroke Neg Hx        none known    Past Medical History:  Diagnosis Date  . Arthritis    hands  . Cancer ([place])    colon cancer 1984  . Cataract   . CHF (congestive heart failure) (HCC)    takes Lasix daily  . Coronary artery disease 08/10/2011  . Dermatochalasis   . Diverticulosis   . Dyslipidemia    takes Niacin daily  . GERD (gastroesophageal reflux disease)    sometimes d/t food  . H/O hiatal hernia   . History of colon polyps   . History of kidney stones   . History of seasonal allergies    takes OTC allergy meds prn  . Hx: recurrent pneumonia   . Hypercholesterolemia   . MR (mitral regurgitation)   . MVP (mitral valve prolapse)   . Pneumonia    early July 2013  . Pseudophakia   . PVC (premature ventricular contraction)   . S/P CABG x 2 08/19/2011   LIMA to LAD, SVG to RCA, EVH via left thigh  . S/P mitral valve repair 08/19/2011   Complex valvuloplasty including triangular resection of posterior leaflet, artificial Goretex neocord placement x6 and 29mm Sorin Memo 3D ring annuloplasty  . Shortness of breath    with exertion/lying/sitting  . Systolic murmur   . Thyroid nodule 08/18/2011   3 cm nodule discovered on chest CT scan  . Urinary frequency   . Urinary urgency     Patient Active Problem List   Diagnosis Date Noted  . Syncope 07/23/2019  . Fall 07/23/2019  . Cerebellar infarction ([place]) 07/03/2018  . Shoulder arthritis 12/13/2017  . Rotator cuff arthropathy of left shoulder   . S/P MVR (mitral valve repair) 10/11/2011  . S/P CABG (coronary artery bypass graft) 10/11/2011  . Ventricular tachycardia ([place]) 08/25/2011  . S/P mitral valve repair 08/19/2011  . S/P CABG x 2 08/19/2011  . Severe mitral regurgitation 08/12/2011  .  CHF (congestive heart failure) ([place])   . MR (mitral regurgitation)   . Cataract   . Dermatochalasis   . Hypercholesterolemia   . Systolic murmur   . Nephrolithiasis   . PVC (premature ventricular contraction)   . Pseudophakia   . Coronary artery disease 08/10/2011    Past Surgical History:  Procedure Laterality Date  . BUNIONECTOMY    . CARDIAC CATHETERIZATION  08/10/11  . CATARACT EXTRACTION  bilateral  . COLONOSCOPY    . CORONARY ARTERY BYPASS GRAFT  08/19/2011   Procedure: CORONARY ARTERY BYPASS GRAFTING (CABG);  Surgeon: Rexene Alberts, MD;  Location: [facility];  Service: Open Heart Surgery;  Laterality: N/A;  Coronary Artery Bypass Grafting times two using left internal mammary artery and left greater saphenous vein endoscopiclly harvested  . CYSTOSCOPY     with manipulation of Ureteral Calculus  . ESOPHAGOGASTRODUODENOSCOPY     with dilitation  . HERNIA REPAIR    . LITHOTRIPSY     Whole body extracorporeal shock wave  . LITHOTRIPSY     Left ESL PUA GPE 01/03/2006,04/03/2006  . MITRAL VALVE REPAIR  08/19/2011   Procedure: MITRAL VALVE REPAIR (MVR);  Surgeon: Rexene Alberts, MD;  Location: [facility];  Service: Open Heart Surgery;  Laterality: N/A;  . partial colecotomy  1984  . REVERSE SHOULDER ARTHROPLASTY Left 12/13/2017   Procedure: LEFT REVERSE SHOULDER ARTHROPLASTY;  Surgeon: Meredith Pel, MD;  Location: [facility];  Service: Orthopedics;  Laterality: Left;    Current Outpatient Medications  Medication Sig Dispense Refill  . amiodarone (PACERONE) 200 MG tablet Take 100 mg by mouth daily.     Marland Kitchen apixaban (ELIQUIS) 5 MG TABS tablet Take 5 mg by mouth 2 (two) times daily.    . Ascorbic Acid (VITAMIN C) 1000 MG tablet Take 1,000 mg by mouth daily.    Marland Kitchen aspirin EC 81 MG tablet Take 81 mg by mouth every evening.     . Cholecalciferol (VITAMIN D3 PO) Take 125 mcg by mouth daily.    . Glucosamine HCl-MSM (GLUCOSAMINE-MSM PO) Take by mouth daily. Glucosamine 1000 mg MSM 1000 mg     . hydroxypropyl methylcellulose / hypromellose (ISOPTO TEARS / GONIOVISC) 2.5 % ophthalmic solution Place 1 drop into both eyes daily as needed for dry eyes.    . Multiple Vitamin (MULTIVITAMIN PO) Take by mouth daily. Senior    . Multiple Vitamins-Minerals (OCUVITE ADULT 50+ PO) Take by mouth daily.    . Omega-3 Fatty Acids (FISH OIL) 1200 MG CAPS Take 1,200 mg by mouth 2 (two) times daily.    . pravastatin (PRAVACHOL) 40 MG tablet Take 40 mg by mouth every evening.    Marland Kitchen QUERCETIN PO Take 1,000 mg by mouth in the morning.    . tamsulosin (FLOMAX) 0.4 MG CAPS capsule Take 0.4 mg by mouth every evening.    Marland Kitchen UNABLE TO FIND 400 mg daily. Med Name: Triple Magnesium Complex    . zinc gluconate 50 MG tablet Take 50 mg by mouth daily.    . furosemide (LASIX) 20 MG tablet Take 20 mg by mouth daily.  (Patient not taking: Reported on 07/17/2019)     No current facility-administered medications for this visit.    Allergies as of 07/17/2019 - Review Complete 07/17/2019  Allergen Reaction Noted  . Ivp dye [iodinated diagnostic agents] Rash 08/12/2011    Vitals: BP (!) 144/71 (BP Location: Left Arm, Patient Position: Sitting)   Pulse 64   Ht 5' 7.5" (1.715 m)   Wt 162 lb (73.5 kg)   BMI 25.00 kg/m  Last Weight:  Wt Readings from Last 1 Encounters:  07/17/19 162 lb (73.5 kg)   Last Height:   Ht Readings from Last 1 Encounters:  07/17/19 5' 7.5" (1.715 m)     Physical exam: Exam: Gen: NAD, conversant, well nourised, well groomed  CV: RRR, no MRG. No Carotid Bruits. No peripheral edema, warm, nontender Eyes: Conjunctivae clear without exudates or hemorrhage  Neuro: Detailed Neurologic Exam  Speech:    Speech is normal; fluent and spontaneous with normal comprehension.  Cognition:    The patient is oriented to person, place, and time;     recent and remote memory intact;     language fluent;     normal attention, concentration,     fund of knowledge Cranial  Nerves:    The pupils are equal, round, and reactive to light.  Attempted funduscopy could not visualize. Visual fields are full to finger confrontation. Extraocular movements are intact. Trigeminal sensation is intact and the muscles of mastication are normal. The face is symmetric. The palate elevates in the midline. Hearing intact. Voice is normal. Shoulder shrug is normal. The tongue has normal motion without fasciculations.   Coordination:    No ataxia or dysmetria  Gait:    Normal native gait  Motor Observation:    No asymmetry, no atrophy, and no involuntary movements noted. Tone:    Normal muscle tone.    Posture:    Posture is normal. normal erect    Strength:    Strength is V/V in the upper and lower limbs.      Sensation: intact to LT     Reflex Exam:  DTR's:    Deep tendon reflexes in the upper and lower extremities are symmetrical bilaterally.   Toes:    The toes are downgoing bilaterally.   Clonus:    Clonus is absent.    Assessment/Plan:   86 y.o. male here as a follow up by Shirline Frees, MD for stroke.  He has a past medical history of hyperlipidemia, mitral valve repair, colon cancer, kidney stones, coronary artery bypass, atrial fibrillation.  Patient with a history of silent bilateral cerebellar strokes likely due to A. fib or post procedure 2013.  However he has been stable since then with no further episodes of TIA or strokelike symptoms.  However he did recently have an episode of falling backwards, he lost balance, possibly loss of consciousness unknown, and lately he feels a little dizzy when he stands up, he did show me his list of blood pressures and his systolic is sometimes as low as 98 or 105.  Appears to be orthostatic hypotension, we discussed this in detail, orthostatics in the office however were normal.  I advised him on staying hydrated and other compensatory measures for orthostatic hypotension.  At this time however will order labs and a CT of  the head to ensure no bleeding given his long-term use of Eliquis.  Orders Placed This Encounter  Procedures  . CT HEAD WO CONTRAST  . CBC with Differential/Platelets  . Comprehensive metabolic panel   No orders of the defined types were placed in this encounter.   Cc: Shirline Frees, MD,  Shirline Frees, MD  Sarina Ill, MD  [facility] [address] [place], [place] [address]  Phone [phone] Fax [phone]  I spent 40 minutes of face-to-face and non-face-to-face time with patient on the  1. Syncope, unspecified syncope type   2. Fall, initial encounter   3. Cerebellar stroke ([place])    diagnosis.  This included previsit chart review, lab review, study review, order entry, electronic health record documentation, patient education on the different diagnostic and therapeutic options, counseling and coordination of care, risks and benefits of management, compliance, or risk factor reduction

## 2019-07-17 NOTE — Progress Notes (Signed)
Orthostatic VS for the past 72 hrs (Last 3 readings):  Orthostatic BP Patient Position BP Location Orthostatic Pulse  07/17/19 0922 137/71 Sitting Left Arm 62  07/17/19 0919 133/70 Standing Left Arm 71  07/17/19 0918 136/77 Sitting Left Arm 59  07/17/19   143/76        Supine     Left arm      56  0917

## 2019-07-17 NOTE — Patient Instructions (Addendum)
Cat Scan of the head Increase drinking fluids to help with the dizziness  Non-Drug Treatment for Low Blood Pressure on Standing:  1. Changing Postures: . Change posture slowly when getting up, especially in the morning . Hold on to something during the first few minutes after standing up. Do not start walking as soon as you get up from the chair . Avoid prolonged recumbency or lying down . Raise the head of the bed by 10 to 20 degrees  2. Exercise: . Perform Isotonic exercise, e.g.recumbent bike, pedaling movements while sitting in a chair . Avoid exercises where you have to strain   3. Avoid Pooling of blood in legs: . Wear custom-fitted elastic stockings. The ones which extend to the abdomen work even better. Consider wearing an abdominal binder. . Perform physical counter-maneuvers, such as crossing legs and tensing leg muscles.  4. Eating and Drinking: . Small meals are recommended. Avoid large meals. Avoid standing suddenly after a large meal . Avoid alcohol . Increase intake of fluids and regular salt. A daily intake of up to 10 grams of sodium per day and a fluid intake of 2.0 to 2.5 liters per day (8 to 10 glasses of water) is recommended.  . Rapid (over 3 minutes) ingestion of approximately 0.5 liter (2 glasses of water) of tap water, raises blood pressure within 5 to 15 minutes and lasts for an hour.  5. Other Tips: . Avoid hot baths. Instead take warm baths . Maintain a BP record standing and lying down . If you are only any BP lowering drugs (antihypertensives, diuretics, antidepressants, drugs for prostate, etc) ask your doctor to revisit the need to keep you on these drugs . If all non-drug therapy fails, ask your doctor about drug therapy   Follow-up with primary care physician.

## 2019-07-18 ENCOUNTER — Telehealth: Payer: Self-pay | Admitting: *Deleted

## 2019-07-18 LAB — COMPREHENSIVE METABOLIC PANEL
ALT: 13 IU/L (ref 0–44)
AST: 16 IU/L (ref 0–40)
Albumin/Globulin Ratio: 2 (ref 1.2–2.2)
Albumin: 4.1 g/dL (ref 3.6–4.6)
Alkaline Phosphatase: 81 IU/L (ref 48–121)
BUN/Creatinine Ratio: 15 (ref 10–24)
BUN: 18 mg/dL (ref 8–27)
Bilirubin Total: 0.7 mg/dL (ref 0.0–1.2)
CO2: 26 mmol/L (ref 20–29)
Calcium: 8.9 mg/dL (ref 8.6–10.2)
Chloride: 107 mmol/L — ABNORMAL HIGH (ref 96–106)
Creatinine, Ser: 1.18 mg/dL (ref 0.76–1.27)
GFR calc Af Amer: 64 mL/min/{1.73_m2} (ref >59)
GFR calc non Af Amer: 55 mL/min/{1.73_m2} — ABNORMAL LOW (ref >59)
Globulin, Total: 2.1 g/dL (ref 1.5–4.5)
Glucose: 93 mg/dL (ref 65–99)
Potassium: 4.7 mmol/L (ref 3.5–5.2)
Sodium: 142 mmol/L (ref 134–144)
Total Protein: 6.2 g/dL (ref 6.0–8.5)

## 2019-07-18 LAB — CBC WITH DIFFERENTIAL/PLATELET
Basophils Absolute: 0.1 10*3/uL (ref 0.0–0.2)
Basos: 1 %
EOS (ABSOLUTE): 0.3 10*3/uL (ref 0.0–0.4)
Eos: 4 %
Hematocrit: 40.4 % (ref 37.5–51.0)
Hemoglobin: 13 g/dL (ref 13.0–17.7)
Immature Grans (Abs): 0 10*3/uL (ref 0.0–0.1)
Immature Granulocytes: 0 %
Lymphocytes Absolute: 1 10*3/uL (ref 0.7–3.1)
Lymphs: 16 %
MCH: 31 pg (ref 26.6–33.0)
MCHC: 32.2 g/dL (ref 31.5–35.7)
MCV: 96 fL (ref 79–97)
Monocytes Absolute: 0.7 10*3/uL (ref 0.1–0.9)
Monocytes: 10 %
Neutrophils Absolute: 4.4 10*3/uL (ref 1.4–7.0)
Neutrophils: 69 %
Platelets: 145 10*3/uL — ABNORMAL LOW (ref 150–450)
RBC: 4.19 x10E6/uL (ref 4.14–5.80)
RDW: 12.5 % (ref 11.6–15.4)
WBC: 6.5 10*3/uL (ref 3.4–10.8)

## 2019-07-18 NOTE — Telephone Encounter (Signed)
Spoke with pt and discussed lab results. Pt verbalized understanding and appreciation.

## 2019-07-18 NOTE — Telephone Encounter (Signed)
-----   Message from Melvenia Beam, MD sent at 07/18/2019 11:10 AM EDT ----- Labs unremarkable thanks

## 2019-07-23 ENCOUNTER — Encounter: Payer: Self-pay | Admitting: Neurology

## 2019-07-23 DIAGNOSIS — W19XXXA Unspecified fall, initial encounter: Secondary | ICD-10-CM | POA: Insufficient documentation

## 2019-07-23 DIAGNOSIS — R55 Syncope and collapse: Secondary | ICD-10-CM | POA: Insufficient documentation

## 2019-07-25 ENCOUNTER — Ambulatory Visit
Admission: RE | Admit: 2019-07-25 | Discharge: 2019-07-25 | Disposition: A | Discharge status: Home / Self Care | Payer: Medicare Other | Source: Ambulatory Visit | Attending: Neurology | Admitting: Neurology

## 2019-07-25 DIAGNOSIS — R55 Syncope and collapse: Secondary | ICD-10-CM

## 2019-07-25 DIAGNOSIS — W19XXXA Unspecified fall, initial encounter: Secondary | ICD-10-CM

## 2019-07-26 ENCOUNTER — Telehealth: Payer: Self-pay | Admitting: *Deleted

## 2019-07-26 NOTE — Telephone Encounter (Signed)
-----   Message from Melvenia Beam, MD sent at 07/26/2019  2:31 PM EDT ----- CT of the head unremarkable, no sequelae of fall seen. thanks

## 2019-07-26 NOTE — Telephone Encounter (Signed)
Spoke with pt and discussed CT head results unremarkable, no concerns or effects seen from the fall. He verbalized understanding and appreciation. He did not have any questions.

## 2019-10-10 DIAGNOSIS — Z23 Encounter for immunization: Secondary | ICD-10-CM | POA: Diagnosis not present

## 2019-10-15 DIAGNOSIS — N401 Enlarged prostate with lower urinary tract symptoms: Secondary | ICD-10-CM | POA: Diagnosis not present

## 2019-10-15 DIAGNOSIS — N183 Chronic kidney disease, stage 3 unspecified: Secondary | ICD-10-CM | POA: Diagnosis not present

## 2019-10-15 DIAGNOSIS — Z85038 Personal history of other malignant neoplasm of large intestine: Secondary | ICD-10-CM | POA: Diagnosis not present

## 2019-10-15 DIAGNOSIS — Z0000 Encounter for general adult medical examination without abnormal findings: Secondary | ICD-10-CM | POA: Diagnosis not present

## 2019-10-15 DIAGNOSIS — I2510 Atherosclerotic heart disease of native coronary artery without angina pectoris: Secondary | ICD-10-CM | POA: Diagnosis not present

## 2019-10-15 DIAGNOSIS — E7800 Pure hypercholesterolemia, unspecified: Secondary | ICD-10-CM | POA: Diagnosis not present

## 2019-10-15 DIAGNOSIS — I480 Paroxysmal atrial fibrillation: Secondary | ICD-10-CM | POA: Diagnosis not present

## 2019-10-26 DIAGNOSIS — Z85038 Personal history of other malignant neoplasm of large intestine: Secondary | ICD-10-CM | POA: Diagnosis not present

## 2019-10-26 DIAGNOSIS — N183 Chronic kidney disease, stage 3 unspecified: Secondary | ICD-10-CM | POA: Diagnosis not present

## 2019-10-26 DIAGNOSIS — I480 Paroxysmal atrial fibrillation: Secondary | ICD-10-CM | POA: Diagnosis not present

## 2019-10-26 DIAGNOSIS — N401 Enlarged prostate with lower urinary tract symptoms: Secondary | ICD-10-CM | POA: Diagnosis not present

## 2019-10-26 DIAGNOSIS — I2510 Atherosclerotic heart disease of native coronary artery without angina pectoris: Secondary | ICD-10-CM | POA: Diagnosis not present

## 2019-10-26 DIAGNOSIS — E7800 Pure hypercholesterolemia, unspecified: Secondary | ICD-10-CM | POA: Diagnosis not present

## 2019-11-11 DIAGNOSIS — Z23 Encounter for immunization: Secondary | ICD-10-CM | POA: Diagnosis not present

## 2019-12-19 DIAGNOSIS — I480 Paroxysmal atrial fibrillation: Secondary | ICD-10-CM | POA: Diagnosis not present

## 2019-12-19 DIAGNOSIS — I25810 Atherosclerosis of coronary artery bypass graft(s) without angina pectoris: Secondary | ICD-10-CM | POA: Diagnosis not present

## 2019-12-19 DIAGNOSIS — I5021 Acute systolic (congestive) heart failure: Secondary | ICD-10-CM | POA: Diagnosis not present

## 2019-12-19 DIAGNOSIS — E7800 Pure hypercholesterolemia, unspecified: Secondary | ICD-10-CM | POA: Diagnosis not present

## 2019-12-19 DIAGNOSIS — I5022 Chronic systolic (congestive) heart failure: Secondary | ICD-10-CM | POA: Diagnosis not present

## 2019-12-19 HISTORY — PX: CIRCUMCISION: SUR203

## 2020-01-01 ENCOUNTER — Telehealth: Payer: Self-pay | Admitting: Neurology

## 2020-01-01 NOTE — Telephone Encounter (Signed)
I called the pt and LVM asking if he would like to do his visit over the phone tomorrow 12/15 @ 8:30 AM rather than coming into the office. Dr Jaynee Eagles is offering, if stable, to do it this way so he can reduce risk of exposure to covid-19 variants. Asked for call back in the AM. Left office number in message and advised if he declined a phone visit he can still come in of course.

## 2020-01-01 NOTE — Progress Notes (Signed)
[facility]    Provider:  Dr Jaynee Eagles Requesting Provider: Shirline Frees, MD Primary Care Provider:  Shirline Frees, MD  CC:  Stroke  Interval history 01/02/2020: Patient is here today, he has a remote history of strokes and is on Eliquis for afib. His wife has FTD and she can't walk and she is at [place]. He visits very often. She was taken off of hospice because she is stable, not decompensating. He feels he doesn't have balance. He is trying to drink more fluid, it helps with his positional dizziness, his blood pressure is not dipping as low as it was. No stroke symptoms. No falls, no moe passing out spells.  Patient saw Dr. Leonie Man who reviewed all his images as well. He sits with his wife all day, he does not exercise. He denies any depression (although he appears sad to me today)   HPI:  [name] [name] is a 86 y.o. male here as a follow up by Shirline Frees, MD for stroke.  He has a past medical history of hyperlipidemia, mitral valve repair, colon cancer, kidney stones, coronary artery bypass, atrial fibrillation.  Patient has not had any clinical symptoms or strokes of TIA the last had an MRI in 2018; I reviewed images which showed remote bilateral cerebellar infarcts.  I reviewed prior notes from Dr. Leonie Man and reviewed images from MRI and agree with radiology notes and also MRI images which showed hypoplastic right vertebral artery but no significant carotid stenosis and he is on long-term anticoagulation for paroxysmal A. fib which initially started following CABG surgery in 2013; strokes could be due to A. fib or post procedure and he has been stable since.  He tolerates Eliquis well.Marland Kitchen   He is here today and he reports that he he goes to see his wife in [place] in hospice for Frontotemporal Dementia, he visits 2-3x a week. He feels well, no stroke symptoms or any new symptoms except he had a syncopal event. He has been overall happy with [facility] and  hospice comes daily M-F to help with care. One Sunday after lunch he was standing and all of a sudden fell backwards. Not lightheaded, he lost balance unknown why, unknown why he fell backwards, unknown loss of consciousness, he denied feeling dizzy or seizure activity, no headaches, no vomiting, no chest, no problems since then. Lately he feels a little dizzy when he stands up or walking. He doesn't drink a lot fluids during the day. He shows me a list of his blood pressures and his systolic is sometimes as low as 98 or 105. He is a poor historian. He had a kidney stone not long ago. Dizziness started the last month.   Reviewed notes, labs and imaging from outside physicians, which showed:   CT head 07/25/2019: Personallyreviewed imaging and agree with the following:   The cortical sulci, fissures and cisterns are normal in size and appearance.  Lateral, third and fourth ventricle are normal in size and appearance.  No extra-axial fluid collections are seen.  No intracranial hemorrhage.  Calcifications in bilateral vertebral and internal carotid arteries. No evidence of mass effect or midline shift.    The orbits and their contents, paranasal sinuses and calvarium are unremarkable.     IMPRESSION:   Unremarkable CT head (without). No acute findings.  Review of Systems: Patient complains of symptoms per HPI as well as the following symptoms: imbalance . Pertinent negatives and positives per HPI. All others negative  Social History   Socioeconomic History  . Marital status: Married    Spouse name: Not on file  . Number of children: 2  . Years of education: Not on file  . Highest education level: Not on file  Occupational History  . Occupation: retired, Chief Financial Officer [employer]  Tobacco Use  . Smoking status: Former Smoker    Types: Cigarettes  . Smokeless tobacco: Never Used  . Tobacco comment: quit 74yrs ago; pt states he smoked only 3 months.  Vaping Use  . Vaping Use: Never used   Substance and Sexual Activity  . Alcohol use: No  . Drug use: No  . Sexual activity: Yes  Other Topics Concern  . Not on file  Social History Narrative   Daughter lives with pt   Right handed   Caffeine: 1 cup max in a day    Social Determinants of Health   Financial Resource Strain: Not on file  Food Insecurity: Not on file  Transportation Needs: Not on file  Physical Activity: Not on file  Stress: Not on file  Social Connections: Not on file  Intimate Partner Violence: Not on file    Family History  Problem Relation Age of Onset  . Cancer Mother   . Heart disease Father   . Heart attack Father   . Glaucoma Sister   . Prostate cancer Other        Fraternal HX   . Stroke Neg Hx        none known    Past Medical History:  Diagnosis Date  . Arthritis    hands  . Cancer ([place])    colon cancer 1984  . Cataract   . CHF (congestive heart failure) (HCC)    takes Lasix daily  . Coronary artery disease 08/10/2011  . Dermatochalasis   . Diverticulosis   . Dyslipidemia    takes Niacin daily  . GERD (gastroesophageal reflux disease)    sometimes d/t food  . H/O hiatal hernia   . History of colon polyps   . History of kidney stones   . History of seasonal allergies    takes OTC allergy meds prn  . Hx: recurrent pneumonia   . Hypercholesterolemia   . MR (mitral regurgitation)   . MVP (mitral valve prolapse)   . Pneumonia    early July 2013  . Pseudophakia   . PVC (premature ventricular contraction)   . S/P CABG x 2 08/19/2011   LIMA to LAD, SVG to RCA, EVH via left thigh  . S/P mitral valve repair 08/19/2011   Complex valvuloplasty including triangular resection of posterior leaflet, artificial Goretex neocord placement x6 and 15mm Sorin Memo 3D ring annuloplasty  . Shortness of breath    with exertion/lying/sitting  . Systolic murmur   . Thyroid nodule 08/18/2011   3 cm nodule discovered on chest CT scan  . Urinary frequency   . Urinary urgency     Patient  Active Problem List   Diagnosis Date Noted  . Syncope 07/23/2019  . Fall 07/23/2019  . Cerebellar infarction ([place]) 07/03/2018  . Shoulder arthritis 12/13/2017  . Rotator cuff arthropathy of left shoulder   . S/P MVR (mitral valve repair) 10/11/2011  . S/P CABG (coronary artery bypass graft) 10/11/2011  . Ventricular tachycardia ([place]) 08/25/2011  . S/P mitral valve repair 08/19/2011  . S/P CABG x 2 08/19/2011  . Severe mitral regurgitation 08/12/2011  . CHF (congestive heart failure) ([place])   . MR (mitral regurgitation)   .  Cataract   . Dermatochalasis   . Hypercholesterolemia   . Systolic murmur   . Nephrolithiasis   . PVC (premature ventricular contraction)   . Pseudophakia   . Coronary artery disease 08/10/2011    Past Surgical History:  Procedure Laterality Date  . BUNIONECTOMY    . CARDIAC CATHETERIZATION  08/10/11  . CATARACT EXTRACTION     bilateral  . COLONOSCOPY    . CORONARY ARTERY BYPASS GRAFT  08/19/2011   Procedure: CORONARY ARTERY BYPASS GRAFTING (CABG);  Surgeon: Rexene Alberts, MD;  Location: [facility];  Service: Open Heart Surgery;  Laterality: N/A;  Coronary Artery Bypass Grafting times two using left internal mammary artery and left greater saphenous vein endoscopiclly harvested  . CYSTOSCOPY     with manipulation of Ureteral Calculus  . ESOPHAGOGASTRODUODENOSCOPY     with dilitation  . HERNIA REPAIR    . LITHOTRIPSY     Whole body extracorporeal shock wave  . LITHOTRIPSY     Left ESL PUA GPE 01/03/2006,04/03/2006  . MITRAL VALVE REPAIR  08/19/2011   Procedure: MITRAL VALVE REPAIR (MVR);  Surgeon: Rexene Alberts, MD;  Location: [facility];  Service: Open Heart Surgery;  Laterality: N/A;  . partial colecotomy  1984  . REVERSE SHOULDER ARTHROPLASTY Left 12/13/2017   Procedure: LEFT REVERSE SHOULDER ARTHROPLASTY;  Surgeon: Meredith Pel, MD;  Location: [facility];  Service: Orthopedics;  Laterality: Left;    Current Outpatient Medications  Medication Sig Dispense  Refill  . apixaban (ELIQUIS) 5 MG TABS tablet Take 5 mg by mouth 2 (two) times daily.    . Ascorbic Acid (VITAMIN C) 1000 MG tablet Take 1,000 mg by mouth daily.    Marland Kitchen aspirin EC 81 MG tablet Take 81 mg by mouth every evening.     . Cholecalciferol (VITAMIN D3 PO) Take 125 mcg by mouth daily.    . Glucosamine HCl-MSM (GLUCOSAMINE-MSM PO) Take by mouth daily. Glucosamine 1000 mg MSM 1000 mg    . hydroxypropyl methylcellulose / hypromellose (ISOPTO TEARS / GONIOVISC) 2.5 % ophthalmic solution Place 1 drop into both eyes daily as needed for dry eyes.    . Multiple Vitamin (MULTIVITAMIN PO) Take by mouth daily. Senior    . Multiple Vitamins-Minerals (OCUVITE ADULT 50+ PO) Take by mouth daily.    . Omega-3 Fatty Acids (FISH OIL) 1200 MG CAPS Take 1,200 mg by mouth 2 (two) times daily.    . pravastatin (PRAVACHOL) 40 MG tablet Take 40 mg by mouth every evening.    Marland Kitchen QUERCETIN PO Take 1,000 mg by mouth in the morning.    . tamsulosin (FLOMAX) 0.4 MG CAPS capsule Take 0.4 mg by mouth every evening.    Marland Kitchen UNABLE TO FIND 400 mg daily. Med Name: Triple Magnesium Complex    . vitamin B-12 (CYANOCOBALAMIN) 1000 MCG tablet Take 1,000 mcg by mouth.    . zinc gluconate 50 MG tablet Take 50 mg by mouth daily.    Marland Kitchen amiodarone (PACERONE) 200 MG tablet Take 100 mg by mouth daily.      No current facility-administered medications for this visit.    Allergies as of 01/02/2020 - Review Complete 01/02/2020  Allergen Reaction Noted  . Ivp dye [iodinated diagnostic agents] Rash 08/12/2011    Vitals: BP 137/67 (BP Location: Right Arm, Patient Position: Sitting)   Pulse (!) 59   Wt 158 lb (71.7 kg)   BMI 24.38 kg/m  Last Weight:  Wt Readings from Last 1 Encounters:  01/02/20 158  lb (71.7 kg)   Last Height:   Ht Readings from Last 1 Encounters:  07/17/19 5' 7.5" (1.715 m)     Physical exam: Exam: Gen: NAD, conversant, flat affect                   Neuro: Detailed Neurologic Exam  Speech:     Speech is normal; fluent and spontaneous with normal comprehension.  Cognition:    The patient is oriented to person, place, and time;   Cranial Nerves:    The pupils are equal, round, and reactive to light.  Visual fields are full to threat. Extraocular movements are intact. Trigeminal sensation is intact and the muscles of mastication are normal. The face is symmetric. The palate elevates in the midline. Hearing intact. Voice is normal. Shoulder shrug is normal. The tongue has normal motion without fasciculations.   Coordination:    No ataxia or dysmetria  Gait:    Can heel and toe walk. Slight imbalance on heel to toe. Slight decrease in arm swing but not overtly parkinsonian.   Motor Observation:    No asymmetry, no atrophy, and no involuntary movements noted. No tremor. Tone:    Normal muscle tone, no cogwheeling   Posture:    Stooped    Strength:    Strength is symmetrical in the upper and lower limbs, 5 to 5-/5.       Sensation: intact to LT     Reflex Exam:  DTR's: absent AJs, 1+ patellars and biceps.      Toes:    The toes are equivocal bilaterally.   Clonus:    Clonus is absent.    Assessment/Plan:   86 y.o. male here as a follow up by Shirline Frees, MD for follow up of remote stroke.  He has a past medical history of hyperlipidemia, mitral valve repair, colon cancer, kidney stones, coronary artery bypass, atrial fibrillation.  Patient with a history of silent bilateral cerebellar strokes likely due to A. fib or post procedure 2013.  However he has been stable since then with no further episodes of TIA or strokelike symptoms.  At last appointment he reported an episode of falling backwards, he lost balance, possibly loss of consciousness unknown, and at last appointment he reported he feels a little dizzy when he stands up, he did show me his list of blood pressures and his systolic is sometimes as low as 98 or 105.  Appeared to be orthostatic hypotension, we discussed  this in detail, orthostatics in the office however were normal.  I advised him on staying hydrated and other compensatory measures for orthostatic hypotension.  a CT of the head showed nothing acute.   - CT head 07/25/2019: Unremarkable CT head (without). No acute findings. - He is drinking more water, no more episodes of lightheadedness or syncope - Imbalance: He doesn't do much, sits with his wife all day. He has some stooping and decreased arm swing and some flat affect but no tremor, not shuffling, tone is normal I don't think this is parkinsonian, I think he needs physical therapy for balance and strength training. He requests [facility] - follow up one year   Orders Placed This Encounter  Procedures  . Ambulatory referral to Physical Therapy      Cc: Shirline Frees, MD,  Shirline Frees, MD  Sarina Ill, MD  [facility] [address] [place], [place] [address]  Phone [phone] Fax [phone]  I spent over 30  minutes of face-to-face and non-face-to-face time with patient on the  1. Imbalance   2. Gait abnormality    diagnosis.  This included previsit chart review, lab review, study review, order entry, electronic health record documentation, patient education on the different diagnostic and therapeutic options, counseling and coordination of care, risks and benefits of management, compliance, or risk factor reduction

## 2020-01-01 NOTE — Telephone Encounter (Signed)
Tori: can you call patient and see if he would like to have tomorrow's appointment by phone if he is stable. I would hate for him to be exposed to any of the new covid-19 variants, he cares for his wife who has dementia. If he Is stable we can do it over the pohone instead. thanks

## 2020-01-01 NOTE — Telephone Encounter (Signed)
Would you call him and see if he is ok with a phone call tomorrow instead of coming in for a visit?

## 2020-01-02 ENCOUNTER — Ambulatory Visit (INDEPENDENT_AMBULATORY_CARE_PROVIDER_SITE_OTHER): Payer: Medicare Other | Admitting: Neurology

## 2020-01-02 ENCOUNTER — Encounter: Payer: Self-pay | Admitting: Neurology

## 2020-01-02 VITALS — BP 137/67 | HR 59 | Wt 158.0 lb

## 2020-01-02 DIAGNOSIS — R2689 Other abnormalities of gait and mobility: Secondary | ICD-10-CM

## 2020-01-02 DIAGNOSIS — I639 Cerebral infarction, unspecified: Secondary | ICD-10-CM

## 2020-01-02 DIAGNOSIS — R269 Unspecified abnormalities of gait and mobility: Secondary | ICD-10-CM

## 2020-01-02 NOTE — Patient Instructions (Signed)
Physical therapy Follow up one year   Fall Prevention in the Home, Adult Falls can cause injuries. They can happen to people of all ages. There are many things you can do to make your home safe and to help prevent falls. Ask for help when making these changes, if needed. What actions can I take to prevent falls? General Instructions  Use good lighting in all rooms. Replace any light bulbs that burn out.  Turn on the lights when you go into a dark area. Use night-lights.  Keep items that you use often in easy-to-reach places. Lower the shelves around your home if necessary.  Set up your furniture so you have a clear path. Avoid moving your furniture around.  Do not have throw rugs and other things on the floor that can make you trip.  Avoid walking on wet floors.  If any of your floors are uneven, fix them.  Add color or contrast paint or tape to clearly mark and help you see: ? Any grab bars or handrails. ? First and last steps of stairways. ? Where the edge of each step is.  If you use a stepladder: ? Make sure that it is fully opened. Do not climb a closed stepladder. ? Make sure that both sides of the stepladder are locked into place. ? Ask someone to hold the stepladder for you while you use it.  If there are any pets around you, be aware of where they are. What can I do in the bathroom?      Keep the floor dry. Clean up any water that spills onto the floor as soon as it happens.  Remove soap buildup in the tub or shower regularly.  Use non-skid mats or decals on the floor of the tub or shower.  Attach bath mats securely with double-sided, non-slip rug tape.  If you need to sit down in the shower, use a plastic, non-slip stool.  Install grab bars by the toilet and in the tub and shower. Do not use towel bars as grab bars. What can I do in the bedroom?  Make sure that you have a light by your bed that is easy to reach.  Do not use any sheets or blankets that  are too big for your bed. They should not hang down onto the floor.  Have a firm chair that has side arms. You can use this for support while you get dressed. What can I do in the kitchen?  Clean up any spills right away.  If you need to reach something above you, use a strong step stool that has a grab bar.  Keep electrical cords out of the way.  Do not use floor polish or wax that makes floors slippery. If you must use wax, use non-skid floor wax. What can I do with my stairs?  Do not leave any items on the stairs.  Make sure that you have a light switch at the top of the stairs and the bottom of the stairs. If you do not have them, ask someone to add them for you.  Make sure that there are handrails on both sides of the stairs, and use them. Fix handrails that are broken or loose. Make sure that handrails are as long as the stairways.  Install non-slip stair treads on all stairs in your home.  Avoid having throw rugs at the top or bottom of the stairs. If you do have throw rugs, attach them to the floor with  carpet tape.  Choose a carpet that does not hide the edge of the steps on the stairway.  Check any carpeting to make sure that it is firmly attached to the stairs. Fix any carpet that is loose or worn. What can I do on the outside of my home?  Use bright outdoor lighting.  Regularly fix the edges of walkways and driveways and fix any cracks.  Remove anything that might make you trip as you walk through a door, such as a raised step or threshold.  Trim any bushes or trees on the path to your home.  Regularly check to see if handrails are loose or broken. Make sure that both sides of any steps have handrails.  Install guardrails along the edges of any raised decks and porches.  Clear walking paths of anything that might make someone trip, such as tools or rocks.  Have any leaves, snow, or ice cleared regularly.  Use sand or salt on walking paths during  winter.  Clean up any spills in your garage right away. This includes grease or oil spills. What other actions can I take?  Wear shoes that: ? Have a low heel. Do not wear high heels. ? Have rubber bottoms. ? Are comfortable and fit you well. ? Are closed at the toe. Do not wear open-toe sandals.  Use tools that help you move around (mobility aids) if they are needed. These include: ? Canes. ? Walkers. ? Scooters. ? Crutches.  Review your medicines with your doctor. Some medicines can make you feel dizzy. This can increase your chance of falling. Ask your doctor what other things you can do to help prevent falls. Where to find more information  Centers for Disease Control and Prevention, [employer]ADI: https://garcia.biz/  [employer] on Aging: BrainJudge.co.uk Contact a doctor if:  You are afraid of falling at home.  You feel weak, drowsy, or dizzy at home.  You fall at home. Summary  There are many simple things that you can do to make your home safe and to help prevent falls.  Ways to make your home safe include removing tripping hazards and installing grab bars in the bathroom.  Ask for help when making these changes in your home. This information is not intended to replace advice given to you by your health care provider. Make sure you discuss any questions you have with your health care provider. Document Revised: 04/27/2018 Document Reviewed: 08/19/2016 Elsevier Patient Education  2020 [employer].

## 2020-01-08 ENCOUNTER — Telehealth: Payer: Self-pay | Admitting: Neurology

## 2020-01-08 NOTE — Telephone Encounter (Signed)
Will sent to [facility] to schedule . I will Put Order Back for Dr. Jaynee Eagles to sign . Thanks Hinton Dyer.  Telephone . [phone] fax [phone]

## 2020-01-16 ENCOUNTER — Ambulatory Visit: Payer: Medicare Other | Admitting: Neurology

## 2020-01-21 DIAGNOSIS — R2689 Other abnormalities of gait and mobility: Secondary | ICD-10-CM | POA: Diagnosis not present

## 2020-01-21 DIAGNOSIS — R269 Unspecified abnormalities of gait and mobility: Secondary | ICD-10-CM | POA: Diagnosis not present

## 2020-01-28 DIAGNOSIS — R2689 Other abnormalities of gait and mobility: Secondary | ICD-10-CM | POA: Diagnosis not present

## 2020-01-28 DIAGNOSIS — R269 Unspecified abnormalities of gait and mobility: Secondary | ICD-10-CM | POA: Diagnosis not present

## 2020-02-18 DIAGNOSIS — Z85038 Personal history of other malignant neoplasm of large intestine: Secondary | ICD-10-CM | POA: Diagnosis not present

## 2020-02-18 DIAGNOSIS — I480 Paroxysmal atrial fibrillation: Secondary | ICD-10-CM | POA: Diagnosis not present

## 2020-02-18 DIAGNOSIS — N401 Enlarged prostate with lower urinary tract symptoms: Secondary | ICD-10-CM | POA: Diagnosis not present

## 2020-02-18 DIAGNOSIS — E7800 Pure hypercholesterolemia, unspecified: Secondary | ICD-10-CM | POA: Diagnosis not present

## 2020-02-18 DIAGNOSIS — I2510 Atherosclerotic heart disease of native coronary artery without angina pectoris: Secondary | ICD-10-CM | POA: Diagnosis not present

## 2020-02-18 DIAGNOSIS — N183 Chronic kidney disease, stage 3 unspecified: Secondary | ICD-10-CM | POA: Diagnosis not present

## 2020-02-19 DIAGNOSIS — R2689 Other abnormalities of gait and mobility: Secondary | ICD-10-CM | POA: Diagnosis not present

## 2020-02-26 DIAGNOSIS — R2689 Other abnormalities of gait and mobility: Secondary | ICD-10-CM | POA: Diagnosis not present

## 2020-03-03 DIAGNOSIS — Z23 Encounter for immunization: Secondary | ICD-10-CM | POA: Diagnosis not present

## 2020-04-03 DIAGNOSIS — S51819A Laceration without foreign body of unspecified forearm, initial encounter: Secondary | ICD-10-CM | POA: Diagnosis not present

## 2020-04-03 DIAGNOSIS — Z85038 Personal history of other malignant neoplasm of large intestine: Secondary | ICD-10-CM | POA: Diagnosis not present

## 2020-04-03 DIAGNOSIS — I480 Paroxysmal atrial fibrillation: Secondary | ICD-10-CM | POA: Diagnosis not present

## 2020-04-03 DIAGNOSIS — I2510 Atherosclerotic heart disease of native coronary artery without angina pectoris: Secondary | ICD-10-CM | POA: Diagnosis not present

## 2020-04-03 DIAGNOSIS — Z23 Encounter for immunization: Secondary | ICD-10-CM | POA: Diagnosis not present

## 2020-04-03 DIAGNOSIS — N401 Enlarged prostate with lower urinary tract symptoms: Secondary | ICD-10-CM | POA: Diagnosis not present

## 2020-04-03 DIAGNOSIS — E7800 Pure hypercholesterolemia, unspecified: Secondary | ICD-10-CM | POA: Diagnosis not present

## 2020-04-03 DIAGNOSIS — N183 Chronic kidney disease, stage 3 unspecified: Secondary | ICD-10-CM | POA: Diagnosis not present

## 2020-04-28 DIAGNOSIS — Z85038 Personal history of other malignant neoplasm of large intestine: Secondary | ICD-10-CM | POA: Diagnosis not present

## 2020-04-28 DIAGNOSIS — E7800 Pure hypercholesterolemia, unspecified: Secondary | ICD-10-CM | POA: Diagnosis not present

## 2020-04-28 DIAGNOSIS — N183 Chronic kidney disease, stage 3 unspecified: Secondary | ICD-10-CM | POA: Diagnosis not present

## 2020-04-28 DIAGNOSIS — I480 Paroxysmal atrial fibrillation: Secondary | ICD-10-CM | POA: Diagnosis not present

## 2020-04-28 DIAGNOSIS — N401 Enlarged prostate with lower urinary tract symptoms: Secondary | ICD-10-CM | POA: Diagnosis not present

## 2020-04-28 DIAGNOSIS — I2510 Atherosclerotic heart disease of native coronary artery without angina pectoris: Secondary | ICD-10-CM | POA: Diagnosis not present

## 2020-04-29 DIAGNOSIS — Z23 Encounter for immunization: Secondary | ICD-10-CM | POA: Diagnosis not present

## 2020-05-06 DIAGNOSIS — Z23 Encounter for immunization: Secondary | ICD-10-CM | POA: Diagnosis not present

## 2020-06-25 DIAGNOSIS — I480 Paroxysmal atrial fibrillation: Secondary | ICD-10-CM | POA: Diagnosis not present

## 2020-06-25 DIAGNOSIS — I2510 Atherosclerotic heart disease of native coronary artery without angina pectoris: Secondary | ICD-10-CM | POA: Diagnosis not present

## 2020-06-25 DIAGNOSIS — N401 Enlarged prostate with lower urinary tract symptoms: Secondary | ICD-10-CM | POA: Diagnosis not present

## 2020-06-25 DIAGNOSIS — Z85038 Personal history of other malignant neoplasm of large intestine: Secondary | ICD-10-CM | POA: Diagnosis not present

## 2020-06-25 DIAGNOSIS — E7800 Pure hypercholesterolemia, unspecified: Secondary | ICD-10-CM | POA: Diagnosis not present

## 2020-06-25 DIAGNOSIS — N183 Chronic kidney disease, stage 3 unspecified: Secondary | ICD-10-CM | POA: Diagnosis not present

## 2020-07-20 DIAGNOSIS — Z20822 Contact with and (suspected) exposure to covid-19: Secondary | ICD-10-CM | POA: Diagnosis not present

## 2020-08-20 DIAGNOSIS — Z20822 Contact with and (suspected) exposure to covid-19: Secondary | ICD-10-CM | POA: Diagnosis not present

## 2020-08-21 DIAGNOSIS — Z85038 Personal history of other malignant neoplasm of large intestine: Secondary | ICD-10-CM | POA: Diagnosis not present

## 2020-08-21 DIAGNOSIS — E7800 Pure hypercholesterolemia, unspecified: Secondary | ICD-10-CM | POA: Diagnosis not present

## 2020-08-21 DIAGNOSIS — N401 Enlarged prostate with lower urinary tract symptoms: Secondary | ICD-10-CM | POA: Diagnosis not present

## 2020-08-21 DIAGNOSIS — I480 Paroxysmal atrial fibrillation: Secondary | ICD-10-CM | POA: Diagnosis not present

## 2020-08-21 DIAGNOSIS — N183 Chronic kidney disease, stage 3 unspecified: Secondary | ICD-10-CM | POA: Diagnosis not present

## 2020-08-21 DIAGNOSIS — I2510 Atherosclerotic heart disease of native coronary artery without angina pectoris: Secondary | ICD-10-CM | POA: Diagnosis not present

## 2020-09-30 DIAGNOSIS — L570 Actinic keratosis: Secondary | ICD-10-CM | POA: Diagnosis not present

## 2020-09-30 DIAGNOSIS — C44622 Squamous cell carcinoma of skin of right upper limb, including shoulder: Secondary | ICD-10-CM | POA: Diagnosis not present

## 2020-09-30 DIAGNOSIS — Z85828 Personal history of other malignant neoplasm of skin: Secondary | ICD-10-CM | POA: Diagnosis not present

## 2020-10-06 DIAGNOSIS — Z23 Encounter for immunization: Secondary | ICD-10-CM | POA: Diagnosis not present

## 2020-10-14 DIAGNOSIS — C44622 Squamous cell carcinoma of skin of right upper limb, including shoulder: Secondary | ICD-10-CM | POA: Diagnosis not present

## 2020-10-14 DIAGNOSIS — L570 Actinic keratosis: Secondary | ICD-10-CM | POA: Diagnosis not present

## 2020-10-14 DIAGNOSIS — Z85828 Personal history of other malignant neoplasm of skin: Secondary | ICD-10-CM | POA: Diagnosis not present

## 2020-10-15 DIAGNOSIS — E7800 Pure hypercholesterolemia, unspecified: Secondary | ICD-10-CM | POA: Diagnosis not present

## 2020-10-15 DIAGNOSIS — N183 Chronic kidney disease, stage 3 unspecified: Secondary | ICD-10-CM | POA: Diagnosis not present

## 2020-10-15 DIAGNOSIS — Z85038 Personal history of other malignant neoplasm of large intestine: Secondary | ICD-10-CM | POA: Diagnosis not present

## 2020-10-15 DIAGNOSIS — Z0000 Encounter for general adult medical examination without abnormal findings: Secondary | ICD-10-CM | POA: Diagnosis not present

## 2020-10-15 DIAGNOSIS — I480 Paroxysmal atrial fibrillation: Secondary | ICD-10-CM | POA: Diagnosis not present

## 2020-10-15 DIAGNOSIS — N401 Enlarged prostate with lower urinary tract symptoms: Secondary | ICD-10-CM | POA: Diagnosis not present

## 2020-10-15 DIAGNOSIS — I2510 Atherosclerotic heart disease of native coronary artery without angina pectoris: Secondary | ICD-10-CM | POA: Diagnosis not present

## 2020-11-11 DIAGNOSIS — N401 Enlarged prostate with lower urinary tract symptoms: Secondary | ICD-10-CM | POA: Diagnosis not present

## 2020-11-11 DIAGNOSIS — E7800 Pure hypercholesterolemia, unspecified: Secondary | ICD-10-CM | POA: Diagnosis not present

## 2020-11-11 DIAGNOSIS — I480 Paroxysmal atrial fibrillation: Secondary | ICD-10-CM | POA: Diagnosis not present

## 2020-11-11 DIAGNOSIS — N183 Chronic kidney disease, stage 3 unspecified: Secondary | ICD-10-CM | POA: Diagnosis not present

## 2020-11-25 DIAGNOSIS — Z85828 Personal history of other malignant neoplasm of skin: Secondary | ICD-10-CM | POA: Diagnosis not present

## 2020-11-25 DIAGNOSIS — L570 Actinic keratosis: Secondary | ICD-10-CM | POA: Diagnosis not present

## 2020-12-03 DIAGNOSIS — R3912 Poor urinary stream: Secondary | ICD-10-CM | POA: Diagnosis not present

## 2020-12-03 DIAGNOSIS — N401 Enlarged prostate with lower urinary tract symptoms: Secondary | ICD-10-CM | POA: Diagnosis not present

## 2020-12-03 DIAGNOSIS — Z87442 Personal history of urinary calculi: Secondary | ICD-10-CM | POA: Diagnosis not present

## 2020-12-03 DIAGNOSIS — N471 Phimosis: Secondary | ICD-10-CM | POA: Diagnosis not present

## 2020-12-03 DIAGNOSIS — N138 Other obstructive and reflux uropathy: Secondary | ICD-10-CM | POA: Diagnosis not present

## 2020-12-23 DIAGNOSIS — Z01812 Encounter for preprocedural laboratory examination: Secondary | ICD-10-CM | POA: Diagnosis not present

## 2020-12-23 DIAGNOSIS — I441 Atrioventricular block, second degree: Secondary | ICD-10-CM | POA: Diagnosis not present

## 2020-12-23 DIAGNOSIS — I454 Nonspecific intraventricular block: Secondary | ICD-10-CM | POA: Diagnosis not present

## 2020-12-23 DIAGNOSIS — Z01810 Encounter for preprocedural cardiovascular examination: Secondary | ICD-10-CM | POA: Diagnosis not present

## 2020-12-23 DIAGNOSIS — N471 Phimosis: Secondary | ICD-10-CM | POA: Diagnosis not present

## 2020-12-29 DIAGNOSIS — Z7901 Long term (current) use of anticoagulants: Secondary | ICD-10-CM | POA: Diagnosis not present

## 2020-12-29 DIAGNOSIS — Z8673 Personal history of transient ischemic attack (TIA), and cerebral infarction without residual deficits: Secondary | ICD-10-CM | POA: Diagnosis not present

## 2020-12-29 DIAGNOSIS — N477 Other inflammatory diseases of prepuce: Secondary | ICD-10-CM | POA: Diagnosis not present

## 2020-12-29 DIAGNOSIS — I2510 Atherosclerotic heart disease of native coronary artery without angina pectoris: Secondary | ICD-10-CM | POA: Diagnosis not present

## 2020-12-29 DIAGNOSIS — N478 Other disorders of prepuce: Secondary | ICD-10-CM | POA: Diagnosis not present

## 2020-12-29 DIAGNOSIS — Z7982 Long term (current) use of aspirin: Secondary | ICD-10-CM | POA: Diagnosis not present

## 2020-12-29 DIAGNOSIS — Z951 Presence of aortocoronary bypass graft: Secondary | ICD-10-CM | POA: Diagnosis not present

## 2020-12-29 DIAGNOSIS — N471 Phimosis: Secondary | ICD-10-CM | POA: Diagnosis not present

## 2021-01-05 ENCOUNTER — Ambulatory Visit: Payer: Medicare Other | Admitting: Neurology

## 2021-01-14 ENCOUNTER — Ambulatory Visit (INDEPENDENT_AMBULATORY_CARE_PROVIDER_SITE_OTHER): Payer: Medicare Other | Admitting: Neurology

## 2021-01-14 ENCOUNTER — Encounter: Payer: Self-pay | Admitting: Neurology

## 2021-01-14 VITALS — BP 107/61 | HR 73 | Ht 67.0 in | Wt 150.2 lb

## 2021-01-14 DIAGNOSIS — G459 Transient cerebral ischemic attack, unspecified: Secondary | ICD-10-CM

## 2021-01-14 DIAGNOSIS — R2689 Other abnormalities of gait and mobility: Secondary | ICD-10-CM | POA: Diagnosis not present

## 2021-01-14 NOTE — Patient Instructions (Signed)
Fall Prevention in the Home, Adult Falls can cause injuries and can happen to people of all ages. There are many things you can do to make your home safe and to help prevent falls. Ask for help when making these changes. What actions can I take to prevent falls? General Instructions Use good lighting in all rooms. Replace any light bulbs that burn out. Turn on the lights in dark areas. Use night-lights. Keep items that you use often in easy-to-reach places. Lower the shelves around your home if needed. Set up your furniture so you have a clear path. Avoid moving your furniture around. Do not have throw rugs or other things on the floor that can make you trip. Avoid walking on wet floors. If any of your floors are uneven, fix them. Add color or contrast paint or tape to clearly mark and help you see: Grab bars or handrails. First and last steps of staircases. Where the edge of each step is. If you use a stepladder: Make sure that it is fully opened. Do not climb a closed stepladder. Make sure the sides of the stepladder are locked in place. Ask someone to hold the stepladder while you use it. Know where your pets are when moving through your home. What can I do in the bathroom?   Keep the floor dry. Clean up any water on the floor right away. Remove soap buildup in the tub or shower. Use nonskid mats or decals on the floor of the tub or shower. Attach bath mats securely with double-sided, nonslip rug tape. If you need to sit down in the shower, use a plastic, nonslip stool. Install grab bars by the toilet and in the tub and shower. Do not use towel bars as grab bars. What can I do in the bedroom? Make sure that you have a light by your bed that is easy to reach. Do not use any sheets or blankets for your bed that hang to the floor. Have a firm chair with side arms that you can use for support when you get dressed. What can I do in the kitchen? Clean up any spills right away. If you  need to reach something above you, use a step stool with a grab bar. Keep electrical cords out of the way. Do not use floor polish or wax that makes floors slippery. What can I do with my stairs? Do not leave any items on the stairs. Make sure that you have a light switch at the top and the bottom of the stairs. Make sure that there are handrails on both sides of the stairs. Fix handrails that are broken or loose. Install nonslip stair treads on all your stairs. Avoid having throw rugs at the top or bottom of the stairs. Choose a carpet that does not hide the edge of the steps on the stairs. Check carpeting to make sure that it is firmly attached to the stairs. Fix carpet that is loose or worn. What can I do on the outside of my home? Use bright outdoor lighting. Fix the edges of walkways and driveways and fix any cracks. Remove anything that might make you trip as you walk through a door, such as a raised step or threshold. Trim any bushes or trees on paths to your home. Check to see if handrails are loose or broken and that both sides of all steps have handrails. Install guardrails along the edges of any raised decks and porches. Clear paths of anything that can  make you trip, such as tools or rocks. Have leaves, snow, or ice cleared regularly. Use sand or salt on paths during winter. Clean up any spills in your garage right away. This includes grease or oil spills. What other actions can I take? Wear shoes that: Have a low heel. Do not wear high heels. Have rubber bottoms. Feel good on your feet and fit well. Are closed at the toe. Do not wear open-toe sandals. Use tools that help you move around if needed. These include: Canes. Walkers. Scooters. Crutches. Review your medicines with your doctor. Some medicines can make you feel dizzy. This can increase your chance of falling. Ask your doctor what else you can do to help prevent falls. Where to find more information Centers for  Disease Control and Prevention, [place]: http://www.wolf.info/ National Institute on Aging: http://kim-miller.com/ Contact a doctor if: You are afraid of falling at home. You feel weak, drowsy, or dizzy at home. You fall at home. Summary There are many simple things that you can do to make your home safe and to help prevent falls. Ways to make your home safe include removing things that can make you trip and installing grab bars in the bathroom. Ask for help when making these changes in your home. This information is not intended to replace advice given to you by your health care provider. Make sure you discuss any questions you have with your health care provider. Document Revised: 08/08/2019 Document Reviewed: 08/08/2019 Elsevier Patient Education  [place]. Stroke Prevention Some medical conditions and behaviors can lead to a higher chance of having a stroke. You can help prevent a stroke by eating healthy, exercising, not smoking, and managing any medical conditions you have. Stroke is a leading cause of functional impairment. Primary prevention is particularly important because a majority of strokes are first-time events. Stroke changes the lives of not only those who experience a stroke but also their family and other caregivers. How can this condition affect me? A stroke is a medical emergency and should be treated right away. A stroke can lead to brain damage and can sometimes be life-threatening. If a person gets medical treatment right away, there is a better chance of surviving and recovering from a stroke. What can increase my risk? The following medical conditions may increase your risk of a stroke: Cardiovascular disease. High blood pressure (hypertension). Diabetes. High cholesterol. Sickle cell disease. Blood clotting disorders (hypercoagulable state). Obesity. Sleep disorders (obstructive sleep apnea). Other risk factors include: Being older than age 35. Having a history of  blood clots, stroke, or mini-stroke (transient ischemic attack, TIA). Genetic factors, such as race, ethnicity, or a family history of stroke. Smoking cigarettes or using other tobacco products. Taking birth control pills, especially if you also use tobacco. Heavy use of alcohol or drugs, especially cocaine and methamphetamine. Physical inactivity. What actions can I take to prevent this? Manage your health conditions High cholesterol levels. Eating a healthy diet is important for preventing high cholesterol. If cholesterol cannot be managed through diet alone, you may need to take medicines. Take any prescribed medicines to control your cholesterol as told by your health care provider. Hypertension. To reduce your risk of stroke, try to keep your blood pressure below 130/80. Eating a healthy diet and exercising regularly are important for controlling blood pressure. If these steps are not enough to manage your blood pressure, you may need to take medicines. Take any prescribed medicines to control hypertension as told by your health care  provider. Ask your health care provider if you should monitor your blood pressure at home. Have your blood pressure checked every year, even if your blood pressure is normal. Blood pressure increases with age and some medical conditions. Diabetes. Eating a healthy diet and exercising regularly are important parts of managing your blood sugar (glucose). If your blood sugar cannot be managed through diet and exercise, you may need to take medicines. Take any prescribed medicines to control your diabetes as told by your health care provider. Get evaluated for obstructive sleep apnea. Talk to your health care provider about getting a sleep evaluation if you snore a lot or have excessive sleepiness. Make sure that any other medical conditions you have, such as atrial fibrillation or atherosclerosis, are managed. Nutrition Follow instructions from your health care  provider about what to eat or drink to help manage your health condition. These instructions may include: Reducing your daily calorie intake. Limiting how much salt (sodium) you use to 1,500 milligrams (mg) each day. Using only healthy fats for cooking, such as olive oil, canola oil, or sunflower oil. Eating healthy foods. You can do this by: Choosing foods that are high in fiber, such as whole grains, and fresh fruits and vegetables. Eating at least 5 servings of fruits and vegetables a day. Try to fill one-half of your plate with fruits and vegetables at each meal. Choosing lean protein foods, such as lean cuts of meat, poultry without skin, fish, tofu, beans, and nuts. Eating low-fat dairy products. Avoiding foods that are high in sodium. This can help lower blood pressure. Avoiding foods that have saturated fat, trans fat, and cholesterol. This can help prevent high cholesterol. Avoiding processed and prepared foods. Counting your daily carbohydrate intake.  Lifestyle If you drink alcohol: Limit how much you have to: 0-1 drink a day for women who are not pregnant. 0-2 drinks a day for men. Know how much alcohol is in your drink. In the [place] one drink equals one 12 oz bottle of beer (349mL), one 5 oz glass of wine (159mL), or one 1 oz glass of hard liquor (37mL). Do not use any products that contain nicotine or tobacco. These products include cigarettes, chewing tobacco, and vaping devices, such as e-cigarettes. If you need help quitting, ask your health care provider. Avoid secondhand smoke. Do not use drugs. Activity  Try to stay at a healthy weight. Get at least 30 minutes of exercise on most days, such as: Fast walking. Biking. Swimming. Medicines Take over-the-counter and prescription medicines only as told by your health care provider. Aspirin or blood thinners (antiplatelets or anticoagulants) may be recommended to reduce your risk of forming blood clots that can lead to  stroke. Avoid taking birth control pills. Talk to your health care provider about the risks of taking birth control pills if: You are over 84 years old. You smoke. You get very bad headaches. You have had a blood clot. Where to find more information American Stroke Association: www.strokeassociation.org Get help right away if: You or a loved one has any symptoms of a stroke. "BE FAST" is an easy way to remember the main warning signs of a stroke: B - Balance. Signs are dizziness, sudden trouble walking, or loss of balance. E - Eyes. Signs are trouble seeing or a sudden change in vision. F - Face. Signs are sudden weakness or numbness of the face, or the face or eyelid drooping on one side. A - Arms. Signs are weakness or numbness in  an arm. This happens suddenly and usually on one side of the body. S - Speech. Signs are sudden trouble speaking, slurred speech, or trouble understanding what people say. T - Time. Time to call emergency services. Write down what time symptoms started. You or a loved one has other signs of a stroke, such as: A sudden, severe headache with no known cause. Nausea or vomiting. Seizure. These symptoms may represent a serious problem that is an emergency. Do not wait to see if the symptoms will go away. Get medical help right away. Call your local emergency services (911 in the [place] Do not drive yourself to the hospital. Summary You can help to prevent a stroke by eating healthy, exercising, not smoking, limiting alcohol intake, and managing any medical conditions you may have. Do not use any products that contain nicotine or tobacco. These include cigarettes, chewing tobacco, and vaping devices, such as e-cigarettes. If you need help quitting, ask your health care provider. Remember "BE FAST" for warning signs of a stroke. Get help right away if you or a loved one has any of these signs. This information is not intended to replace advice given to you by your  health care provider. Make sure you discuss any questions you have with your health care provider. Document Revised: 08/06/2019 Document Reviewed: 08/06/2019 Elsevier Patient Education  [place].

## 2021-01-14 NOTE — Progress Notes (Signed)
[facility]    Provider:  Dr Jaynee Eagles Requesting Provider: Shirline Frees, MD Primary Care Provider:  Shirline Frees, MD  CC:  Stroke  01/14/2021; No falls, no blackouts, his balance is better, he is extremely careful. No strokes. Doing well. Appetite is good, eating well, not choking, he has gained a few pounds recently his BMI is 23, his recent bmp normal, cbc unremarkable, no strokes or TIAs, he is still on eliquis for stroke prevention, he is on B12, he went to physical therapy in early 2022 for imbalance, he improved. His wife Leane Platt is still going strong, she never complains, she is at [place]. He visits his wife 3-4x a day.   Interval history 01/02/2020: Patient is here today, he has a remote history of strokes and is on Eliquis for afib. His wife has FTD and she can't walk and she is at [place]. He visits very often. She was taken off of hospice because she is stable, not decompensating. He feels he doesn't have balance. He is trying to drink more fluid, it helps with his positional dizziness, his blood pressure is not dipping as low as it was. No stroke symptoms. No falls, no moe passing out spells.  Patient saw Dr. Leonie Man who reviewed all his images as well. He sits with his wife all day, he does not exercise. He denies any depression (although he appears sad to me today)   HPI:  BRAIDON CHERMAK is a 87 y.o. male here as a follow up by Shirline Frees, MD for stroke.  He has a past medical history of hyperlipidemia, mitral valve repair, colon cancer, kidney stones, coronary artery bypass, atrial fibrillation.  Patient has not had any clinical symptoms or strokes of TIA the last had an MRI in 2018; I reviewed images which showed remote bilateral cerebellar infarcts.  I reviewed prior notes from Dr. Leonie Man and reviewed images from MRI and agree with radiology notes and also MRI images which showed hypoplastic right vertebral artery but no significant carotid  stenosis and he is on long-term anticoagulation for paroxysmal A. fib which initially started following CABG surgery in 2013; strokes could be due to A. fib or post procedure and he has been stable since.  He tolerates Eliquis well.Marland Kitchen   He is here today and he reports that he he goes to see his wife in [place] in hospice for Frontotemporal Dementia, he visits 2-3x a week. He feels well, no stroke symptoms or any new symptoms except he had a syncopal event. He has been overall happy with [facility] and hospice comes daily M-F to help with care. One Sunday after lunch he was standing and all of a sudden fell backwards. Not lightheaded, he lost balance unknown why, unknown why he fell backwards, unknown loss of consciousness, he denied feeling dizzy or seizure activity, no headaches, no vomiting, no chest, no problems since then. Lately he feels a little dizzy when he stands up or walking. He doesn't drink a lot fluids during the day. He shows me a list of his blood pressures and his systolic is sometimes as low as 98 or 105. He is a poor historian. He had a kidney stone not long ago. Dizziness started the last month.   Reviewed notes, labs and imaging from outside physicians, which showed:   CT head 07/25/2019: Personallyreviewed imaging and agree with the following:   The cortical sulci, fissures and cisterns are normal in size and appearance.  Lateral, third and fourth  ventricle are normal in size and appearance.  No extra-axial fluid collections are seen.  No intracranial hemorrhage.  Calcifications in bilateral vertebral and internal carotid arteries. No evidence of mass effect or midline shift.     The orbits and their contents, paranasal sinuses and calvarium are unremarkable.       IMPRESSION:    Unremarkable CT head (without). No acute findings.  Patient complains of symptoms per HPI as well as the following symptoms: circumcision . Pertinent negatives and positives per HPI. All others  negative     Social History   Socioeconomic History   Marital status: Married    Spouse name: Not on file   Number of children: 2   Years of education: Not on file   Highest education level: Not on file  Occupational History   Occupation: retired, Chief Financial Officer [employer]  Tobacco Use   Smoking status: Former    Types: Cigarettes   Smokeless tobacco: Never   Tobacco comments:    quit 17yrs ago; pt states he smoked only 3 months.  Vaping Use   Vaping Use: Never used  Substance and Sexual Activity   Alcohol use: No   Drug use: No   Sexual activity: Yes  Other Topics Concern   Not on file  Social History Narrative   Daughter lives with pt   Right handed   Caffeine: 1 cup max in a day    Social Determinants of Health   Financial Resource Strain: Not on file  Food Insecurity: Not on file  Transportation Needs: Not on file  Physical Activity: Not on file  Stress: Not on file  Social Connections: Not on file  Intimate Partner Violence: Not on file    Family History  Problem Relation Age of Onset   Cancer Mother    Heart disease Father    Heart attack Father    Glaucoma Sister    Prostate cancer Other        Fraternal HX    Stroke Neg Hx        none known    Past Medical History:  Diagnosis Date   Arthritis    hands   Cancer ([place])    colon cancer 1984   Cataract    CHF (congestive heart failure) ([place])    takes Lasix daily   Coronary artery disease 08/10/2011   Dermatochalasis    Diverticulosis    Dyslipidemia    takes Niacin daily   GERD (gastroesophageal reflux disease)    sometimes d/t food   H/O hiatal hernia    History of colon polyps    History of kidney stones    History of seasonal allergies    takes OTC allergy meds prn   Hx: recurrent pneumonia    Hypercholesterolemia    MR (mitral regurgitation)    MVP (mitral valve prolapse)    Pneumonia    early July 2013   Pseudophakia    PVC (premature ventricular contraction)    S/P CABG x 2 08/19/2011    LIMA to LAD, SVG to RCA, EVH via left thigh   S/P mitral valve repair 08/19/2011   Complex valvuloplasty including triangular resection of posterior leaflet, artificial Goretex neocord placement x6 and 70mm Sorin Memo 3D ring annuloplasty   Shortness of breath    with exertion/lying/sitting   Systolic murmur    Thyroid nodule 08/18/2011   3 cm nodule discovered on chest CT scan   Urinary frequency    Urinary urgency  Patient Active Problem List   Diagnosis Date Noted   Syncope 07/23/2019   Fall 07/23/2019   Cerebellar infarction ([place]) 07/03/2018   Shoulder arthritis 12/13/2017   Rotator cuff arthropathy of left shoulder    S/P MVR (mitral valve repair) 10/11/2011   S/P CABG (coronary artery bypass graft) 10/11/2011   Ventricular tachycardia 08/25/2011   S/P mitral valve repair 08/19/2011   S/P CABG x 2 08/19/2011   Severe mitral regurgitation 08/12/2011   CHF (congestive heart failure) ([facility])    MR (mitral regurgitation)    Cataract    Dermatochalasis    Hypercholesterolemia    Systolic murmur    Nephrolithiasis    PVC (premature ventricular contraction)    Pseudophakia    Coronary artery disease 08/10/2011    Past Surgical History:  Procedure Laterality Date   BUNIONECTOMY     CARDIAC CATHETERIZATION  08/10/2011   CATARACT EXTRACTION     bilateral   CIRCUMCISION  12/2019   COLONOSCOPY     CORONARY ARTERY BYPASS GRAFT  08/19/2011   Procedure: CORONARY ARTERY BYPASS GRAFTING (CABG);  Surgeon: Rexene Alberts, MD;  Location: [facility];  Service: Open Heart Surgery;  Laterality: N/A;  Coronary Artery Bypass Grafting times two using left internal mammary artery and left greater saphenous vein endoscopiclly harvested   CYSTOSCOPY     with manipulation of Ureteral Calculus   ESOPHAGOGASTRODUODENOSCOPY     with dilitation   HERNIA REPAIR     LITHOTRIPSY     Whole body extracorporeal shock wave   LITHOTRIPSY     Left ESL PUA GPE 01/03/2006,04/03/2006   MITRAL VALVE REPAIR   08/19/2011   Procedure: MITRAL VALVE REPAIR (MVR);  Surgeon: Rexene Alberts, MD;  Location: [facility];  Service: Open Heart Surgery;  Laterality: N/A;   partial colecotomy  01/18/1982   REVERSE SHOULDER ARTHROPLASTY Left 12/13/2017   Procedure: LEFT REVERSE SHOULDER ARTHROPLASTY;  Surgeon: Meredith Pel, MD;  Location: [facility];  Service: Orthopedics;  Laterality: Left;    Current Outpatient Medications  Medication Sig Dispense Refill   apixaban (ELIQUIS) 5 MG TABS tablet Take 5 mg by mouth 2 (two) times daily.     aspirin EC 81 MG tablet Take 81 mg by mouth every evening.      finasteride (PROSCAR) 5 MG tablet Take 5 mg by mouth daily.     Glucosamine HCl-MSM (GLUCOSAMINE-MSM PO) Take by mouth daily. Glucosamine 1000 mg MSM 1000 mg     Omega-3 Fatty Acids (FISH OIL) 1200 MG CAPS Take 1,200 mg by mouth 2 (two) times daily.     pravastatin (PRAVACHOL) 40 MG tablet Take 40 mg by mouth every evening.     QUERCETIN PO Take 1,000 mg by mouth in the morning.     tamsulosin (FLOMAX) 0.4 MG CAPS capsule Take 0.4 mg by mouth every evening.     UNABLE TO FIND 400 mg daily. Med Name: Triple Magnesium Complex     amiodarone (PACERONE) 200 MG tablet Take 100 mg by mouth daily.      Multiple Vitamin (MULTIVITAMIN PO) Take by mouth daily. Senior     Multiple Vitamins-Minerals (OCUVITE ADULT 50+ PO) Take by mouth daily.     zinc gluconate 50 MG tablet Take 50 mg by mouth daily.     No current facility-administered medications for this visit.    Allergies as of 01/14/2021 - Review Complete 01/14/2021  Allergen Reaction Noted   Ivp dye [iodinated contrast media] Rash 08/12/2011  Vitals: BP 107/61    Pulse 73    Ht 5\' 7"  (1.702 m)    Wt 150 lb 3.2 oz (68.1 kg)    BMI 23.52 kg/m  Last Weight:  Wt Readings from Last 1 Encounters:  01/14/21 150 lb 3.2 oz (68.1 kg)   Last Height:   Ht Readings from Last 1 Encounters:  01/14/21 5\' 7"  (1.702 m)    Exam: NAD, pleasant                  Speech:     Speech is normal; fluent and spontaneous with normal comprehension.  Cognition:    The patient is oriented to person, place, and time;     recent and remote memory intact;     language fluent;    Cranial Nerves:    The pupils are equal, round, and reactive to light.Trigeminal sensation is intact and the muscles of mastication are normal. The face is symmetric. The palate elevates in the midline. Hearing intact. Voice is normal. Shoulder shrug is normal. The tongue has normal motion without fasciculations.   Coordination:  No dysmetria  Motor Observation:    No asymmetry, no atrophy, and no involuntary movements noted. Tone:    Normal muscle tone.     Strength:    Strength is antigravity and equal in the upper and lower limbs. No focal deficits.      Sensation: intact to LT  Gait: slightly stopped, low clearance, but has good arm swing and no apparent imbalance.      Assessment/Plan:   87 y.o. male here as a follow up by Shirline Frees, MD for follow up of remote stroke.  He has a past medical history of hyperlipidemia, mitral valve repair, colon cancer, kidney stones, coronary artery bypass, atrial fibrillation.  Patient with a history of silent bilateral cerebellar strokes likely due to A. fib or post procedure 2013.  However he has been stable since then with no further episodes of TIA or strokelike symptoms.  At last appointment he reported an episode of falling backwards, he lost balance, possibly loss of consciousness unknown, and at last appointment he reported he feels a little dizzy when he stands up, he did show me his list of blood pressures and his systolic is sometimes as low as 98 or 105.  Appeared to be orthostatic hypotension, we discussed this in detail, orthostatics in the office however were normal.  I advised him on staying hydrated and other compensatory measures for orthostatic hypotension.  a CT of the head showed nothing acute.   - CT head 07/25/2019: Unremarkable  CT head (without). No acute findings. - He is drinking more water, no more episodes of lightheadedness or syncope - Imbalance: Improved. No falls, He doesn't do much, sits with his wife all day. He has some stooping and decreased arm swing and some flat affect but no tremor, not shuffling, tone is normal I don't think this is parkinsonian, I think he needs physical therapy for balance and strength training. He requests [facility] - PATIENT CAN BE RETURNED TO PRIMARY CARE   No orders of the defined types were placed in this encounter.     Cc: Shirline Frees, MD,  Shirline Frees, MD  Sarina Ill, MD  [facility] [address] [place], [place] [address]  Phone [phone] Fax [phone]  I spent over 10 minutes of face-to-face and non-face-to-face time with patient on the  1. Imbalance   2. TIA (  transient ischemic attack)     diagnosis.  This included previsit chart review, lab review, study review, order entry, electronic health record documentation, patient education on the different diagnostic and therapeutic options, counseling and coordination of care, risks and benefits of management, compliance, or risk factor reduction

## 2021-01-23 DIAGNOSIS — I25810 Atherosclerosis of coronary artery bypass graft(s) without angina pectoris: Secondary | ICD-10-CM | POA: Diagnosis not present

## 2021-01-23 DIAGNOSIS — I5021 Acute systolic (congestive) heart failure: Secondary | ICD-10-CM | POA: Diagnosis not present

## 2021-01-23 DIAGNOSIS — I5022 Chronic systolic (congestive) heart failure: Secondary | ICD-10-CM | POA: Diagnosis not present

## 2021-01-23 DIAGNOSIS — I480 Paroxysmal atrial fibrillation: Secondary | ICD-10-CM | POA: Diagnosis not present

## 2021-01-23 DIAGNOSIS — Z951 Presence of aortocoronary bypass graft: Secondary | ICD-10-CM | POA: Diagnosis not present

## 2021-01-23 DIAGNOSIS — I6501 Occlusion and stenosis of right vertebral artery: Secondary | ICD-10-CM | POA: Diagnosis not present

## 2021-01-26 DIAGNOSIS — Z87442 Personal history of urinary calculi: Secondary | ICD-10-CM | POA: Diagnosis not present

## 2021-01-26 DIAGNOSIS — N471 Phimosis: Secondary | ICD-10-CM | POA: Diagnosis not present

## 2021-01-26 DIAGNOSIS — N401 Enlarged prostate with lower urinary tract symptoms: Secondary | ICD-10-CM | POA: Diagnosis not present

## 2021-01-26 DIAGNOSIS — N138 Other obstructive and reflux uropathy: Secondary | ICD-10-CM | POA: Diagnosis not present

## 2021-03-16 DIAGNOSIS — Z20822 Contact with and (suspected) exposure to covid-19: Secondary | ICD-10-CM | POA: Diagnosis not present

## 2021-03-29 DIAGNOSIS — Z20828 Contact with and (suspected) exposure to other viral communicable diseases: Secondary | ICD-10-CM | POA: Diagnosis not present

## 2021-04-07 DIAGNOSIS — Z20822 Contact with and (suspected) exposure to covid-19: Secondary | ICD-10-CM | POA: Diagnosis not present

## 2021-04-07 DIAGNOSIS — I083 Combined rheumatic disorders of mitral, aortic and tricuspid valves: Secondary | ICD-10-CM | POA: Diagnosis not present

## 2021-04-10 DIAGNOSIS — Z20822 Contact with and (suspected) exposure to covid-19: Secondary | ICD-10-CM | POA: Diagnosis not present

## 2021-04-13 DIAGNOSIS — I519 Heart disease, unspecified: Secondary | ICD-10-CM | POA: Diagnosis not present

## 2021-04-13 DIAGNOSIS — I5021 Acute systolic (congestive) heart failure: Secondary | ICD-10-CM | POA: Diagnosis not present

## 2021-04-13 DIAGNOSIS — I25810 Atherosclerosis of coronary artery bypass graft(s) without angina pectoris: Secondary | ICD-10-CM | POA: Diagnosis not present

## 2021-04-13 DIAGNOSIS — I5022 Chronic systolic (congestive) heart failure: Secondary | ICD-10-CM | POA: Diagnosis not present

## 2021-04-13 DIAGNOSIS — I480 Paroxysmal atrial fibrillation: Secondary | ICD-10-CM | POA: Diagnosis not present

## 2021-04-16 DIAGNOSIS — E7800 Pure hypercholesterolemia, unspecified: Secondary | ICD-10-CM | POA: Diagnosis not present

## 2021-04-16 DIAGNOSIS — N183 Chronic kidney disease, stage 3 unspecified: Secondary | ICD-10-CM | POA: Diagnosis not present

## 2021-04-18 DIAGNOSIS — Z20822 Contact with and (suspected) exposure to covid-19: Secondary | ICD-10-CM | POA: Diagnosis not present

## 2021-04-23 DIAGNOSIS — Z20822 Contact with and (suspected) exposure to covid-19: Secondary | ICD-10-CM | POA: Diagnosis not present

## 2021-05-04 DIAGNOSIS — Z20822 Contact with and (suspected) exposure to covid-19: Secondary | ICD-10-CM | POA: Diagnosis not present

## 2021-05-14 DIAGNOSIS — Z20822 Contact with and (suspected) exposure to covid-19: Secondary | ICD-10-CM | POA: Diagnosis not present

## 2021-05-20 DIAGNOSIS — Z20822 Contact with and (suspected) exposure to covid-19: Secondary | ICD-10-CM | POA: Diagnosis not present

## 2021-05-25 DIAGNOSIS — Z20822 Contact with and (suspected) exposure to covid-19: Secondary | ICD-10-CM | POA: Diagnosis not present

## 2021-05-28 DIAGNOSIS — I25810 Atherosclerosis of coronary artery bypass graft(s) without angina pectoris: Secondary | ICD-10-CM | POA: Diagnosis not present

## 2021-05-28 DIAGNOSIS — I480 Paroxysmal atrial fibrillation: Secondary | ICD-10-CM | POA: Diagnosis not present

## 2021-05-28 DIAGNOSIS — I5021 Acute systolic (congestive) heart failure: Secondary | ICD-10-CM | POA: Diagnosis not present

## 2021-05-28 DIAGNOSIS — I519 Heart disease, unspecified: Secondary | ICD-10-CM | POA: Diagnosis not present

## 2021-05-28 DIAGNOSIS — I5022 Chronic systolic (congestive) heart failure: Secondary | ICD-10-CM | POA: Diagnosis not present

## 2021-05-28 DIAGNOSIS — R42 Dizziness and giddiness: Secondary | ICD-10-CM | POA: Diagnosis not present

## 2021-05-29 DIAGNOSIS — I4892 Unspecified atrial flutter: Secondary | ICD-10-CM | POA: Diagnosis not present

## 2021-05-29 DIAGNOSIS — Z951 Presence of aortocoronary bypass graft: Secondary | ICD-10-CM | POA: Diagnosis not present

## 2021-05-29 DIAGNOSIS — I25810 Atherosclerosis of coronary artery bypass graft(s) without angina pectoris: Secondary | ICD-10-CM | POA: Diagnosis not present

## 2021-05-29 DIAGNOSIS — I4891 Unspecified atrial fibrillation: Secondary | ICD-10-CM | POA: Diagnosis not present

## 2021-06-30 DIAGNOSIS — H43813 Vitreous degeneration, bilateral: Secondary | ICD-10-CM | POA: Diagnosis not present

## 2021-06-30 DIAGNOSIS — H40003 Preglaucoma, unspecified, bilateral: Secondary | ICD-10-CM | POA: Diagnosis not present

## 2021-06-30 DIAGNOSIS — H5201 Hypermetropia, right eye: Secondary | ICD-10-CM | POA: Diagnosis not present

## 2021-06-30 DIAGNOSIS — Z83511 Family history of glaucoma: Secondary | ICD-10-CM | POA: Diagnosis not present

## 2021-06-30 DIAGNOSIS — H52203 Unspecified astigmatism, bilateral: Secondary | ICD-10-CM | POA: Diagnosis not present

## 2021-06-30 DIAGNOSIS — H524 Presbyopia: Secondary | ICD-10-CM | POA: Diagnosis not present

## 2021-06-30 DIAGNOSIS — H5212 Myopia, left eye: Secondary | ICD-10-CM | POA: Diagnosis not present

## 2021-07-06 DIAGNOSIS — I480 Paroxysmal atrial fibrillation: Secondary | ICD-10-CM | POA: Diagnosis not present

## 2021-07-06 DIAGNOSIS — Z7901 Long term (current) use of anticoagulants: Secondary | ICD-10-CM | POA: Diagnosis not present

## 2021-07-06 DIAGNOSIS — I4891 Unspecified atrial fibrillation: Secondary | ICD-10-CM | POA: Diagnosis not present

## 2021-07-06 DIAGNOSIS — R001 Bradycardia, unspecified: Secondary | ICD-10-CM | POA: Diagnosis not present

## 2021-07-06 DIAGNOSIS — Z9889 Other specified postprocedural states: Secondary | ICD-10-CM | POA: Diagnosis not present

## 2021-07-06 DIAGNOSIS — I5022 Chronic systolic (congestive) heart failure: Secondary | ICD-10-CM | POA: Diagnosis not present

## 2021-07-06 DIAGNOSIS — I25810 Atherosclerosis of coronary artery bypass graft(s) without angina pectoris: Secondary | ICD-10-CM | POA: Diagnosis not present

## 2021-07-06 DIAGNOSIS — M7989 Other specified soft tissue disorders: Secondary | ICD-10-CM | POA: Diagnosis not present

## 2021-07-07 DIAGNOSIS — I480 Paroxysmal atrial fibrillation: Secondary | ICD-10-CM | POA: Diagnosis not present

## 2021-07-07 DIAGNOSIS — R42 Dizziness and giddiness: Secondary | ICD-10-CM | POA: Diagnosis not present

## 2021-07-09 DIAGNOSIS — I4891 Unspecified atrial fibrillation: Secondary | ICD-10-CM | POA: Diagnosis not present

## 2021-07-09 DIAGNOSIS — I4729 Other ventricular tachycardia: Secondary | ICD-10-CM | POA: Diagnosis not present

## 2021-08-07 DIAGNOSIS — I5021 Acute systolic (congestive) heart failure: Secondary | ICD-10-CM | POA: Diagnosis not present

## 2021-08-07 DIAGNOSIS — I6501 Occlusion and stenosis of right vertebral artery: Secondary | ICD-10-CM | POA: Diagnosis not present

## 2021-08-07 DIAGNOSIS — I519 Heart disease, unspecified: Secondary | ICD-10-CM | POA: Diagnosis not present

## 2021-08-07 DIAGNOSIS — I4891 Unspecified atrial fibrillation: Secondary | ICD-10-CM | POA: Diagnosis not present

## 2021-08-07 DIAGNOSIS — E7800 Pure hypercholesterolemia, unspecified: Secondary | ICD-10-CM | POA: Diagnosis not present

## 2021-08-10 DIAGNOSIS — I441 Atrioventricular block, second degree: Secondary | ICD-10-CM | POA: Diagnosis not present

## 2021-08-10 DIAGNOSIS — I499 Cardiac arrhythmia, unspecified: Secondary | ICD-10-CM | POA: Diagnosis not present

## 2021-08-10 DIAGNOSIS — I4891 Unspecified atrial fibrillation: Secondary | ICD-10-CM | POA: Diagnosis not present

## 2021-08-26 DIAGNOSIS — N401 Enlarged prostate with lower urinary tract symptoms: Secondary | ICD-10-CM | POA: Diagnosis not present

## 2021-08-26 DIAGNOSIS — N138 Other obstructive and reflux uropathy: Secondary | ICD-10-CM | POA: Diagnosis not present

## 2021-10-15 DIAGNOSIS — Z23 Encounter for immunization: Secondary | ICD-10-CM | POA: Diagnosis not present

## 2021-10-16 DIAGNOSIS — E7800 Pure hypercholesterolemia, unspecified: Secondary | ICD-10-CM | POA: Diagnosis not present

## 2021-10-16 DIAGNOSIS — I441 Atrioventricular block, second degree: Secondary | ICD-10-CM | POA: Diagnosis not present

## 2021-10-16 DIAGNOSIS — Z8673 Personal history of transient ischemic attack (TIA), and cerebral infarction without residual deficits: Secondary | ICD-10-CM | POA: Diagnosis not present

## 2021-10-16 DIAGNOSIS — I25810 Atherosclerosis of coronary artery bypass graft(s) without angina pectoris: Secondary | ICD-10-CM | POA: Diagnosis not present

## 2021-10-16 DIAGNOSIS — I4891 Unspecified atrial fibrillation: Secondary | ICD-10-CM | POA: Diagnosis not present

## 2021-10-16 DIAGNOSIS — I5022 Chronic systolic (congestive) heart failure: Secondary | ICD-10-CM | POA: Diagnosis not present

## 2021-10-16 DIAGNOSIS — Z9889 Other specified postprocedural states: Secondary | ICD-10-CM | POA: Diagnosis not present

## 2021-10-20 DIAGNOSIS — I499 Cardiac arrhythmia, unspecified: Secondary | ICD-10-CM | POA: Diagnosis not present

## 2021-10-20 DIAGNOSIS — I4891 Unspecified atrial fibrillation: Secondary | ICD-10-CM | POA: Diagnosis not present

## 2021-10-20 DIAGNOSIS — I440 Atrioventricular block, first degree: Secondary | ICD-10-CM | POA: Diagnosis not present

## 2021-10-20 DIAGNOSIS — I454 Nonspecific intraventricular block: Secondary | ICD-10-CM | POA: Diagnosis not present

## 2021-10-26 DIAGNOSIS — I480 Paroxysmal atrial fibrillation: Secondary | ICD-10-CM | POA: Diagnosis not present

## 2021-10-26 DIAGNOSIS — Z85038 Personal history of other malignant neoplasm of large intestine: Secondary | ICD-10-CM | POA: Diagnosis not present

## 2021-10-26 DIAGNOSIS — E7800 Pure hypercholesterolemia, unspecified: Secondary | ICD-10-CM | POA: Diagnosis not present

## 2021-10-26 DIAGNOSIS — R600 Localized edema: Secondary | ICD-10-CM | POA: Diagnosis not present

## 2021-10-26 DIAGNOSIS — I5022 Chronic systolic (congestive) heart failure: Secondary | ICD-10-CM | POA: Diagnosis not present

## 2021-10-26 DIAGNOSIS — I2510 Atherosclerotic heart disease of native coronary artery without angina pectoris: Secondary | ICD-10-CM | POA: Diagnosis not present

## 2021-10-26 DIAGNOSIS — Z0000 Encounter for general adult medical examination without abnormal findings: Secondary | ICD-10-CM | POA: Diagnosis not present

## 2021-10-26 DIAGNOSIS — N401 Enlarged prostate with lower urinary tract symptoms: Secondary | ICD-10-CM | POA: Diagnosis not present

## 2021-11-13 DIAGNOSIS — I4891 Unspecified atrial fibrillation: Secondary | ICD-10-CM | POA: Diagnosis not present

## 2021-11-16 DIAGNOSIS — I440 Atrioventricular block, first degree: Secondary | ICD-10-CM | POA: Diagnosis not present

## 2021-11-16 DIAGNOSIS — I498 Other specified cardiac arrhythmias: Secondary | ICD-10-CM | POA: Diagnosis not present

## 2021-11-16 DIAGNOSIS — I4891 Unspecified atrial fibrillation: Secondary | ICD-10-CM | POA: Diagnosis not present

## 2021-12-18 HISTORY — PX: OTHER SURGICAL HISTORY: SHX169

## 2021-12-22 DIAGNOSIS — H40003 Preglaucoma, unspecified, bilateral: Secondary | ICD-10-CM | POA: Diagnosis not present

## 2022-01-07 DIAGNOSIS — E7800 Pure hypercholesterolemia, unspecified: Secondary | ICD-10-CM | POA: Diagnosis not present

## 2022-01-07 DIAGNOSIS — Z951 Presence of aortocoronary bypass graft: Secondary | ICD-10-CM | POA: Diagnosis not present

## 2022-01-07 DIAGNOSIS — Z950 Presence of cardiac pacemaker: Secondary | ICD-10-CM | POA: Diagnosis not present

## 2022-01-07 DIAGNOSIS — Z79899 Other long term (current) drug therapy: Secondary | ICD-10-CM | POA: Diagnosis not present

## 2022-01-07 DIAGNOSIS — Z8673 Personal history of transient ischemic attack (TIA), and cerebral infarction without residual deficits: Secondary | ICD-10-CM | POA: Diagnosis not present

## 2022-01-07 DIAGNOSIS — I495 Sick sinus syndrome: Secondary | ICD-10-CM | POA: Diagnosis not present

## 2022-01-07 DIAGNOSIS — I501 Left ventricular failure: Secondary | ICD-10-CM | POA: Diagnosis not present

## 2022-01-07 DIAGNOSIS — I2510 Atherosclerotic heart disease of native coronary artery without angina pectoris: Secondary | ICD-10-CM | POA: Diagnosis not present

## 2022-01-07 DIAGNOSIS — R001 Bradycardia, unspecified: Secondary | ICD-10-CM | POA: Diagnosis not present

## 2022-01-07 DIAGNOSIS — Z954 Presence of other heart-valve replacement: Secondary | ICD-10-CM | POA: Diagnosis not present

## 2022-01-07 DIAGNOSIS — I4819 Other persistent atrial fibrillation: Secondary | ICD-10-CM | POA: Diagnosis not present

## 2022-01-07 DIAGNOSIS — I10 Essential (primary) hypertension: Secondary | ICD-10-CM | POA: Diagnosis not present

## 2022-01-07 DIAGNOSIS — I480 Paroxysmal atrial fibrillation: Secondary | ICD-10-CM | POA: Diagnosis not present

## 2022-01-08 DIAGNOSIS — Z950 Presence of cardiac pacemaker: Secondary | ICD-10-CM | POA: Diagnosis not present

## 2022-01-08 DIAGNOSIS — Z9889 Other specified postprocedural states: Secondary | ICD-10-CM | POA: Diagnosis not present

## 2022-01-08 DIAGNOSIS — Z951 Presence of aortocoronary bypass graft: Secondary | ICD-10-CM | POA: Diagnosis not present

## 2022-01-08 DIAGNOSIS — I495 Sick sinus syndrome: Secondary | ICD-10-CM | POA: Diagnosis not present

## 2022-01-08 DIAGNOSIS — I4819 Other persistent atrial fibrillation: Secondary | ICD-10-CM | POA: Diagnosis not present

## 2022-01-08 DIAGNOSIS — Z8673 Personal history of transient ischemic attack (TIA), and cerebral infarction without residual deficits: Secondary | ICD-10-CM | POA: Diagnosis not present

## 2022-01-08 DIAGNOSIS — I480 Paroxysmal atrial fibrillation: Secondary | ICD-10-CM | POA: Diagnosis not present

## 2022-01-08 DIAGNOSIS — I2510 Atherosclerotic heart disease of native coronary artery without angina pectoris: Secondary | ICD-10-CM | POA: Diagnosis not present

## 2022-01-08 DIAGNOSIS — M47814 Spondylosis without myelopathy or radiculopathy, thoracic region: Secondary | ICD-10-CM | POA: Diagnosis not present

## 2022-03-23 ENCOUNTER — Other Ambulatory Visit: Payer: Self-pay | Admitting: Family Medicine

## 2022-03-23 DIAGNOSIS — R2689 Other abnormalities of gait and mobility: Secondary | ICD-10-CM

## 2022-03-24 ENCOUNTER — Other Ambulatory Visit (HOSPITAL_COMMUNITY): Payer: Self-pay | Admitting: Family Medicine

## 2022-03-24 DIAGNOSIS — R2689 Other abnormalities of gait and mobility: Secondary | ICD-10-CM

## 2022-05-18 ENCOUNTER — Encounter (HOSPITAL_COMMUNITY): Payer: Self-pay

## 2022-05-18 ENCOUNTER — Ambulatory Visit (HOSPITAL_COMMUNITY): Payer: Medicare Other

## 2022-05-25 ENCOUNTER — Encounter: Payer: Self-pay | Admitting: *Deleted

## 2022-05-25 DIAGNOSIS — E876 Hypokalemia: Secondary | ICD-10-CM | POA: Insufficient documentation

## 2022-05-25 DIAGNOSIS — N401 Enlarged prostate with lower urinary tract symptoms: Secondary | ICD-10-CM

## 2022-05-25 DIAGNOSIS — Z85038 Personal history of other malignant neoplasm of large intestine: Secondary | ICD-10-CM

## 2022-05-25 DIAGNOSIS — N183 Chronic kidney disease, stage 3 unspecified: Secondary | ICD-10-CM

## 2022-05-25 DIAGNOSIS — I480 Paroxysmal atrial fibrillation: Secondary | ICD-10-CM | POA: Insufficient documentation

## 2022-05-26 ENCOUNTER — Encounter: Payer: Self-pay | Admitting: Neurology

## 2022-05-26 ENCOUNTER — Ambulatory Visit: Payer: Medicare Other | Admitting: Neurology

## 2022-05-26 VITALS — BP 105/66 | HR 88 | Ht 67.0 in | Wt 141.4 lb

## 2022-05-26 DIAGNOSIS — R42 Dizziness and giddiness: Secondary | ICD-10-CM | POA: Diagnosis not present

## 2022-05-26 DIAGNOSIS — R270 Ataxia, unspecified: Secondary | ICD-10-CM

## 2022-05-26 DIAGNOSIS — M4712 Other spondylosis with myelopathy, cervical region: Secondary | ICD-10-CM

## 2022-05-26 DIAGNOSIS — W19XXXA Unspecified fall, initial encounter: Secondary | ICD-10-CM

## 2022-05-26 DIAGNOSIS — R269 Unspecified abnormalities of gait and mobility: Secondary | ICD-10-CM

## 2022-05-26 DIAGNOSIS — E860 Dehydration: Secondary | ICD-10-CM | POA: Diagnosis not present

## 2022-05-26 DIAGNOSIS — R292 Abnormal reflex: Secondary | ICD-10-CM

## 2022-05-26 NOTE — Patient Instructions (Addendum)
Increase fluids urine should be a clear light yellow  MRI cervical spine Vestibular therapy - [place] Try to elevate head of bed at night Decrease lasix, discussed with cardiology, may use as needed, urine looks very dark today we collected for a UA.  Follow up in 6 months  Non-Drug Treatment for Low Blood Pressure on Standing:  1. Changing Postures:  Change posture slowly when getting up, especially in the morning  Hold on to something during the first few minutes after standing up. Do not start walking as soon as you get up from the chair  Avoid prolonged recumbency or lying down  Raise the head of the bed by 10 to 20 degrees  2. Exercise:  Perform Isotonic exercise, e.g.recumbent bike, pedaling movements while sitting in a chair  Avoid exercises where you have to strain   3. Avoid Pooling of blood in legs:  Wear custom-fitted elastic stockings. The ones which extend to the abdomen work even better. Consider wearing an abdominal binder.  Perform physical counter-maneuvers, such as crossing legs and tensing leg muscles.  4. Eating and Drinking:  Small meals are recommended. Avoid large meals. Avoid standing suddenly after a large meal  Avoid alcohol  Increase intake of fluids and regular salt. A daily intake of up to 10 grams of sodium per day and a fluid intake of 2.0 to 2.5 liters per day (8 to 10 glasses of water) is recommended.   Rapid (over 3 minutes) ingestion of approximately 0.5 liter (2 glasses of water) of tap water, raises blood pressure within 5 to 15 minutes and lasts for an hour.  5. Other Tips:  Avoid hot baths. Instead take warm baths  Maintain a BP record standing and lying down  If you are only any BP lowering drugs (antihypertensives, diuretics, antidepressants, drugs for prostate, etc) ask your doctor to revisit the need to keep you on these drugs  If all non-drug therapy fails, ask your doctor about drug therapy   Follow-up with primary care  physician.

## 2022-05-26 NOTE — Progress Notes (Signed)
[facility]    Provider:  Dr Lucia [name] Requesting Provider: [name], MD Primary Care Provider:  [name], MD  CC:  Stroke  Follow-up May 2024: This is a patient who is 89 years old who is here for follow-up.  We have seen him in the past for stroke last time he was seen was in December 2022. has CHF (congestive heart failure) (HCC); MR (mitral regurgitation); Cataract; Dermatochalasis; Hypercholesterolemia; Systolic murmur; Nephrolithiasis; PVC (premature ventricular contraction); Pseudophakia; Severe mitral regurgitation; Coronary artery disease; S/P mitral valve repair; S/P CABG x 2; Ventricular tachycardia (HCC); S/P MVR (mitral valve repair); S/P CABG (coronary artery bypass graft); Shoulder arthritis; Rotator cuff arthropathy of left shoulder; Cerebellar infarction (HCC); Syncope; Fall; Hypokalemia; History of colon cancer; Paroxysmal atrial fibrillation (HCC); Benign prostatic hyperplasia with lower urinary tract symptoms; and Chronic kidney disease (CKD), stage III (moderate) (HCC) on their problem list.  I reviewed notes from a [name] [name], patient reported worsened imbalance, cerebellar disease, had an MRI of the brain completed.  Recent labs March 23, 2022 include normal thyroid, unremarkable CMP with BUN 22 and creatinine 0.86, unremarkable CBC with slightly low platelets at 146 and very minimal anemia hemoglobin 12.5.  He had an MRI 03/23/2022 because of imbalance and falling backwards and notes further go to explain that he had a recent episode when he was sitting in the bed preparing to get up and he felt a sense of the force pushing and back, it happened at another time after a while he was able to get up out of bed since this began he has felt "wobbly" although he has had no further falls.  He denies true vertigo headache or nausea.  He does have a history of vertigo and stroke seen by me and chronic old small strokes in the cerebellum.  He has a history of  A-fib on Eliquis. An MRi brain was ordered 12/5 but I do not see results on EPIC, I reviewed report that was on "Care Everywhere" and it appears nothing acute and no new strokes; no acute abnormality to explain his feelings of imbalance. Always happens in the morning. At night he gets in bed moving around.   Here with his son. He feels like he is spinning. It happens first thing in the morning when sit up on the edge of the bed. Happens with changing positons. He also feels imbalance, he has neck pain.Feels dizzy. Feels like someone is pulling him back. He has neck issues, pain in the stiff, stiffness in the neck, walking he feels imbalanced. Usually the dizziness is when changing positions. Not drinking a lot of fluids. He hasn't eaten or drank anything today, he doesn't drink a lot of water. He doesn't drink enough water. He is also taking a fluid pill. Doesn't urinate often. His BP is low 105 systolic. Discussed Drink more water, tea, or fluids. May ask cardiology to decrease the fluid pill, he states he does not get edema anymore.    Reviewed MRI brain images: MRI brain report 03/2021: EXAM:  MRI HEAD WITHOUT CONTRAST   TECHNIQUE:  Multiplanar, multiecho pulse sequences of the brain and surrounding  structures were obtained without intravenous contrast.   COMPARISON:  01/02/2017   FINDINGS:  Brain: No acute infarct, mass effect or extra-axial collection. No  acute or chronic hemorrhage. Minimal multifocal hyperintense  T2-weight signal within the white matter. Old punctate cerebellar  infarcts are less clearly demonstrated on the current study. The  midline structures are normal.  Vascular: Major flow voids are preserved. Diminutive right vertebral  artery, unchanged   Skull and upper cervical spine: Normal calvarium and skull base.  Visualized upper cervical spine and soft tissues are normal.   Sinuses/Orbits:No paranasal sinus fluid levels or advanced mucosal  thickening. No  mastoid or middle ear effusion. Normal orbits.   IMPRESSION:  1. No acute intracranial abnormality.  2. Old, tiny cerebellar infarcts   Patient complains of symptoms per HPI as well as the following symptoms: dizzy, imbalanced . Pertinent negatives and positives per HPI. All others negative   01/14/2021; No falls, no blackouts, his balance is better, he is extremely careful. No strokes. Doing well. Appetite is good, eating well, not choking, he has gained a few pounds recently his BMI is 23, his recent bmp normal, cbc unremarkable, no strokes or TIAs, he is still on eliquis for stroke prevention, he is on B12, he went to physical therapy in early 2022 for imbalance, he improved. His wife [name] [name] is still going strong, she never complains, she is at [place]. He visits his wife 3-4x a day.   Interval history 01/02/2020: Patient is here today, he has a remote history of strokes and is on Eliquis for afib. His wife has FTD and she can't walk and she is at [place]. He visits very often. She was taken off of hospice because she is stable, not decompensating. He feels he doesn't have balance. He is trying to drink more fluid, it helps with his positional dizziness, his blood pressure is not dipping as low as it was. No stroke symptoms. No falls, no moe passing out spells.  Patient saw [name] [name] who reviewed all his images as well. He sits with his wife all day, he does not exercise. He denies any depression (although he appears sad to me today)   HPI:  [name] [name] is a 89 y.o. male here as a follow up by [name] Blamer, MD for stroke.  He has a past medical history of hyperlipidemia, mitral valve repair, colon cancer, kidney stones, coronary artery bypass, atrial fibrillation.  Patient has not had any clinical symptoms or strokes of TIA the last had an MRI in 2018; I reviewed images which showed remote bilateral cerebellar infarcts.  I reviewed prior notes from [name] [name] and reviewed images  from MRI and agree with radiology notes and also MRI images which showed hypoplastic right vertebral artery but no significant carotid stenosis and he is on long-term anticoagulation for paroxysmal A. fib which initially started following CABG surgery in 2013; strokes could be due to A. fib or post procedure and he has been stable since.  He tolerates Eliquis well.Marland Kitchen   He is here today and he reports that he he goes to see his wife in [place] in hospice for Frontotemporal Dementia, he visits 2-3x a week. He feels well, no stroke symptoms or any new symptoms except he had a syncopal event. He has been overall happy with [facility] and hospice comes daily M-F to help with care. One Sunday after lunch he was standing and all of a sudden fell backwards. Not lightheaded, he lost balance unknown why, unknown why he fell backwards, unknown loss of consciousness, he denied feeling dizzy or seizure activity, no headaches, no vomiting, no chest, no problems since then. Lately he feels a little dizzy when he stands up or walking. He doesn't drink a lot fluids during the day. He shows me a list of his blood pressures and  his systolic is sometimes as low as 98 or 105. He is a poor historian. He had a kidney stone not long ago. Dizziness started the last month.   Reviewed notes, labs and imaging from outside physicians, which showed:   CT head 07/25/2019: Personallyreviewed imaging and agree with the following:   The cortical sulci, fissures and cisterns are normal in size and appearance.  Lateral, third and fourth ventricle are normal in size and appearance.  No extra-axial fluid collections are seen.  No intracranial hemorrhage.  Calcifications in bilateral vertebral and internal carotid arteries. No evidence of mass effect or midline shift.     The orbits and their contents, paranasal sinuses and calvarium are unremarkable.       IMPRESSION:    Unremarkable CT head (without). No acute findings.  Patient  complains of symptoms per HPI as well as the following symptoms: circumcision . Pertinent negatives and positives per HPI. All others negative     Social History   Socioeconomic History   Marital status: Married    Spouse name: Not on file   Number of children: 2   Years of education: Not on file   Highest education level: Not on file  Occupational History   Occupation: retired, Art gallery manager [employer]  Tobacco Use   Smoking status: Former    Types: Cigarettes   Smokeless tobacco: Never   Tobacco comments:    quit 6yrs ago; pt states he smoked only 3 months.  Vaping Use   Vaping Use: Never used  Substance and Sexual Activity   Alcohol use: No   Drug use: No   Sexual activity: Yes  Other Topics Concern   Not on file  Social History Narrative   Daughter lives with pt   Right handed   Caffeine: 1 cup max in a day    Social Determinants of Health   Financial Resource Strain: Not on file  Food Insecurity: Not on file  Transportation Needs: Not on file  Physical Activity: Not on file  Stress: Not on file  Social Connections: Not on file  Intimate Partner Violence: Not on file    Family History  Problem Relation Age of Onset   Cancer Mother    Heart disease Father    Heart attack Father    Glaucoma Sister    Prostate cancer Other        Fraternal HX    Stroke Neg Hx        none known    Past Medical History:  Diagnosis Date   Arthritis    hands   Cancer (HCC)    colon cancer 1984   Cataract    CHF (congestive heart failure) (HCC)    takes Lasix daily   Coronary artery disease 08/10/2011   Dermatochalasis    Dislocated wrist    left   Diverticulosis    Dyslipidemia    takes Niacin daily   GERD (gastroesophageal reflux disease)    sometimes d/t food   H/O hiatal hernia    History of colon polyps    History of kidney stones    History of seasonal allergies    takes OTC allergy meds prn   Hx: recurrent pneumonia    Hypercholesterolemia    Hyperlipidemia     MR (mitral regurgitation)    MVP (mitral valve prolapse)    Pneumonia    early July 2013   Pseudophakia    PVC (premature ventricular contraction)    S/P CABG x 2  08/19/2011   LIMA to LAD, SVG to RCA, EVH via left thigh   S/P mitral valve repair 08/19/2011   Complex valvuloplasty including triangular resection of posterior leaflet, artificial Goretex neocord placement x6 and 38mm Sorin Memo 3D ring annuloplasty   Shortness of breath    with exertion/lying/sitting   Systolic murmur    Thyroid nodule 08/18/2011   3 cm nodule discovered on chest CT scan   Urinary frequency    Urinary urgency     Patient Active Problem List   Diagnosis Date Noted   Hypokalemia 05/25/2022   History of colon cancer 05/25/2022   Paroxysmal atrial fibrillation (HCC) 05/25/2022   Benign prostatic hyperplasia with lower urinary tract symptoms 05/25/2022   Chronic kidney disease (CKD), stage III (moderate) (HCC) 05/25/2022   Syncope 07/23/2019   Fall 07/23/2019   Cerebellar infarction (HCC) 07/03/2018   Shoulder arthritis 12/13/2017   Rotator cuff arthropathy of left shoulder    S/P MVR (mitral valve repair) 10/11/2011   S/P CABG (coronary artery bypass graft) 10/11/2011   Ventricular tachycardia (HCC) 08/25/2011   S/P mitral valve repair 08/19/2011   S/P CABG x 2 08/19/2011   Severe mitral regurgitation 08/12/2011   CHF (congestive heart failure) (HCC)    MR (mitral regurgitation)    Cataract    Dermatochalasis    Hypercholesterolemia    Systolic murmur    Nephrolithiasis    PVC (premature ventricular contraction)    Pseudophakia    Coronary artery disease 08/10/2011    Past Surgical History:  Procedure Laterality Date   BUNIONECTOMY     CARDIAC CATHETERIZATION  08/10/2011   CATARACT EXTRACTION     bilateral   CIRCUMCISION  12/2019   COLONOSCOPY     CORONARY ARTERY BYPASS GRAFT  08/19/2011   Procedure: CORONARY ARTERY BYPASS GRAFTING (CABG);  Surgeon: Purcell Nails, MD;  Location:  [facility] OR;  Service: Open Heart Surgery;  Laterality: N/A;  Coronary Artery Bypass Grafting times two using left internal mammary artery and left greater saphenous vein endoscopiclly harvested   CYSTOSCOPY     with manipulation of Ureteral Calculus   ESOPHAGOGASTRODUODENOSCOPY     with dilitation   HERNIA REPAIR     LITHOTRIPSY     Whole body extracorporeal shock wave   LITHOTRIPSY     Left ESL PUA GPE 01/03/2006,04/03/2006   MITRAL VALVE REPAIR  08/19/2011   Procedure: MITRAL VALVE REPAIR (MVR);  Surgeon: Purcell Nails, MD;  Location: [facility] OR;  Service: Open Heart Surgery;  Laterality: N/A;   pace maker     [place] medication   pacemaker  12/2021   partial colecotomy  01/18/1982   PARTIAL COLECTOMY  1984   REVERSE SHOULDER ARTHROPLASTY Left 12/13/2017   Procedure: LEFT REVERSE SHOULDER ARTHROPLASTY;  Surgeon: Cammy Copa, MD;  Location: [name] OR;  Service: Orthopedics;  Laterality: Left;    Current Outpatient Medications  Medication Sig Dispense Refill   apixaban (ELIQUIS) 5 MG TABS tablet Take 5 mg by mouth 2 (two) times daily.     aspirin EC 81 MG tablet Take 81 mg by mouth every evening.      finasteride (PROSCAR) 5 MG tablet Take 5 mg by mouth daily.     furosemide (LASIX) 20 MG tablet Take 20 mg by mouth daily.     hydroxypropyl methylcellulose / hypromellose (ISOPTO TEARS / GONIOVISC) 2.5 % ophthalmic solution Place 1 drop into both eyes as needed for dry eyes.     Multiple Vitamins-Minerals (  OCUVITE ADULT 50+ PO) Take by mouth daily.     Omega-3 Fatty Acids (FISH OIL) 1200 MG CAPS Take 1,200 mg by mouth 2 (two) times daily.     No current facility-administered medications for this visit.    Allergies as of 05/26/2022 - Review Complete 05/26/2022  Allergen Reaction Noted   Hibiclens [chlorhexidine gluconate]  05/25/2022   Ivp dye [iodinated contrast media] Rash 08/12/2011   Niacin and related Rash 05/25/2022    Vitals: BP 105/66 (BP Location: Right Arm, Patient  Position: Sitting, Cuff Size: Normal)   Pulse 88   Ht 5\' 7"  (1.702 m)   Wt 141 lb 6.4 oz (64.1 kg)   BMI 22.15 kg/m  Last Weight:  Wt Readings from Last 1 Encounters:  05/26/22 141 lb 6.4 oz (64.1 kg)   Last Height:   Ht Readings from Last 1 Encounters:  05/26/22 5\' 7"  (1.702 m)        Exam: NAD, pleasant                  Speech:    Speech is normal; fluent and spontaneous with normal comprehension.  Cognition:    The patient is oriented to person, place, and time;     recent and remote memory intact;     language fluent;    Cranial Nerves:    The pupils are equal, round, and reactive to light.Trigeminal sensation is intact and the muscles of mastication are normal. The face is symmetric. The palate elevates in the midline. Hearing intact. Voice is normal. Shoulder shrug is normal. The tongue has normal motion without fasciculations.   Coordination:  No dysmetria  Motor Observation:    No asymmetry, no atrophy, and no involuntary movements noted. Tone:    Normal muscle tone.     Strength: Right arm weakness      Sensation: intact to LT  Gait: stoopped, low clearance,. Imblance on heel/toe/tandem. Low clearance. Decreased right arm swing  DTR: brisk     Assessment/Plan:   89 y.o. male here as a follow up by [name] Blamer, MD for dizziness, imbalance.  He has a past medical history of hyperlipidemia, mitral valve repair, colon cancer, kidney stones, coronary artery bypass, atrial fibrillation.  Patient with a history of silent bilateral cerebellar strokes likely due to A. fib or post procedure 2013.  However he has been stable as far as strokes, recent MRI without new strokes and so since then with no further episodes of TIA or strokelike symptoms.  Today and at  last appointment he reports episodes of falling backwards, imbalance, dizziness when he stands up, he did show me his list of blood pressures and his systolic is sometimes as low as 98 or 105.  His blood  pressures dropped slightly but not orthostatic.  I advised him on staying hydrated and other compensatory measures for orthostatic hypotension.  a CT of the head showed nothing acute. Son is here with mr. Stroble today and provides much information  Increase fluids urine should be a clear light yellow  MRI cervical spine for ataxia and dizziness, abnormal neuro exam and gait Vestibular therapy - [place] but [place] is fine, may be vertigo, vestibular therapy Try to elevate head of bed at night Decrease lasix, discussed with cardiology, may use as needed, urine looks very dark today we collected for a UA. Increase fluids. May consider cta H&N if above not helpful Follow up in 3 month  Orders Placed This Encounter  Procedures  Culture, Urine   MR CERVICAL SPINE WO CONTRAST   Urinalysis, Routine w reflex microscopic   Ambulatory referral to Physical Therapy      Cc: [name] Blamer, MD,  [name] Blamer, MD  Naomie Dean, MD  [facility] [address] [place], [place] [address]  Phone [phone] Fax [phone]  I spent over 45 minutes of face-to-face and non-face-to-face time with patient on the  1. Dizziness   2. Ataxia   3. screen for Myelopathy due to cervical spondylosis   4. Vertigo   5. Fall, initial encounter   6. Dehydration   7. Gait abnormality   8. Abnormal DTR (deep tendon reflex)      diagnosis.  This included previsit chart review, lab review, study review, order entry, electronic health record documentation, patient education on the different diagnostic and therapeutic options, counseling and coordination of care, risks and benefits of management, compliance, or risk factor reduction

## 2022-05-27 LAB — URINALYSIS, ROUTINE W REFLEX MICROSCOPIC
Bilirubin, UA: NEGATIVE
Glucose, UA: NEGATIVE
Ketones, UA: NEGATIVE
Leukocytes,UA: NEGATIVE
Nitrite, UA: NEGATIVE
Protein,UA: NEGATIVE
RBC, UA: NEGATIVE
Specific Gravity, UA: 1.022 (ref 1.005–1.030)
Urobilinogen, Ur: 0.2 mg/dL (ref 0.2–1.0)
pH, UA: 6 (ref 5.0–7.5)

## 2022-05-28 LAB — URINE CULTURE: Organism ID, Bacteria: NO GROWTH

## 2022-05-29 ENCOUNTER — Telehealth: Payer: Self-pay | Admitting: Neurology

## 2022-05-29 NOTE — Telephone Encounter (Signed)
I called and discussed with his son david, I conversed with patient's cardiologist(Strag, Gabrielle Dare, PA-C) and mr Hazel  can stop his lasix and take as needed we will see if that helps with dizziness, if not or he gets HTN or  edema he can restart thanks

## 2022-05-31 ENCOUNTER — Telehealth: Payer: Self-pay | Admitting: Neurology

## 2022-05-31 NOTE — Telephone Encounter (Signed)
[employer] medicare NPR sent to GI [phone] 

## 2022-06-02 ENCOUNTER — Ambulatory Visit: Payer: Medicare Other | Attending: Neurology

## 2022-06-02 DIAGNOSIS — R42 Dizziness and giddiness: Secondary | ICD-10-CM | POA: Diagnosis present

## 2022-06-02 DIAGNOSIS — R2681 Unsteadiness on feet: Secondary | ICD-10-CM | POA: Diagnosis present

## 2022-06-02 NOTE — Therapy (Signed)
OUTPATIENT PHYSICAL THERAPY VESTIBULAR EVALUATION     Patient Name: [name] [name] MRN: [id number] DOB:1929-06-28, 89 y.o., male Today's Date: 06/02/2022  END OF SESSION:  PT End of Session - 06/02/22 1403     Visit Number 1    Number of Visits 9    Date for PT Re-Evaluation 07/02/22    Authorization Type [employer] medicare    PT Start Time 1402    PT Stop Time 1450    PT Time Calculation (min) 48 min    Activity Tolerance Patient tolerated treatment well    Behavior During Therapy WFL for tasks assessed/performed             Past Medical History:  Diagnosis Date   Arthritis    hands   Cancer (HCC)    colon cancer 1984   Cataract    CHF (congestive heart failure) (HCC)    takes Lasix daily   Coronary artery disease 08/10/2011   Dermatochalasis    Dislocated wrist    left   Diverticulosis    Dyslipidemia    takes Niacin daily   GERD (gastroesophageal reflux disease)    sometimes d/t food   H/O hiatal hernia    History of colon polyps    History of kidney stones    History of seasonal allergies    takes OTC allergy meds prn   Hx: recurrent pneumonia    Hypercholesterolemia    Hyperlipidemia    MR (mitral regurgitation)    MVP (mitral valve prolapse)    Pneumonia    early July 2013   Pseudophakia    PVC (premature ventricular contraction)    S/P CABG x 2 08/19/2011   LIMA to LAD, SVG to RCA, EVH via left thigh   S/P mitral valve repair 08/19/2011   Complex valvuloplasty including triangular resection of posterior leaflet, artificial Goretex neocord placement x6 and 38mm Sorin Memo 3D ring annuloplasty   Shortness of breath    with exertion/lying/sitting   Systolic murmur    Thyroid nodule 08/18/2011   3 cm nodule discovered on chest CT scan   Urinary frequency    Urinary urgency    Past Surgical History:  Procedure Laterality Date   BUNIONECTOMY     CARDIAC CATHETERIZATION  08/10/2011   CATARACT EXTRACTION     bilateral   CIRCUMCISION  12/2019    COLONOSCOPY     CORONARY ARTERY BYPASS GRAFT  08/19/2011   Procedure: CORONARY ARTERY BYPASS GRAFTING (CABG);  Surgeon: Purcell Nails, MD;  Location: [facility];  Service: Open Heart Surgery;  Laterality: N/A;  Coronary Artery Bypass Grafting times two using left internal mammary artery and left greater saphenous vein endoscopiclly harvested   CYSTOSCOPY     with manipulation of Ureteral Calculus   ESOPHAGOGASTRODUODENOSCOPY     with dilitation   HERNIA REPAIR     LITHOTRIPSY     Whole body extracorporeal shock wave   LITHOTRIPSY     Left ESL PUA GPE 01/03/2006,04/03/2006   MITRAL VALVE REPAIR  08/19/2011   Procedure: MITRAL VALVE REPAIR (MVR);  Surgeon: Purcell Nails, MD;  Location: [facility] OR;  Service: Open Heart Surgery;  Laterality: N/A;   pace maker     [place] medication   pacemaker  12/2021   partial colecotomy  01/18/1982   PARTIAL COLECTOMY  1984   REVERSE SHOULDER ARTHROPLASTY Left 12/13/2017   Procedure: LEFT REVERSE SHOULDER ARTHROPLASTY;  Surgeon: Cammy Copa, MD;  Location: [facility] OR;  Service:  Orthopedics;  Laterality: Left;   Patient Active Problem List   Diagnosis Date Noted   Hypokalemia 05/25/2022   History of colon cancer 05/25/2022   Paroxysmal atrial fibrillation (HCC) 05/25/2022   Benign prostatic hyperplasia with lower urinary tract symptoms 05/25/2022   Chronic kidney disease (CKD), stage III (moderate) (HCC) 05/25/2022   Syncope 07/23/2019   Fall 07/23/2019   Cerebellar infarction (HCC) 07/03/2018   Shoulder arthritis 12/13/2017   Rotator cuff arthropathy of left shoulder    S/P MVR (mitral valve repair) 10/11/2011   S/P CABG (coronary artery bypass graft) 10/11/2011   Ventricular tachycardia (HCC) 08/25/2011   S/P mitral valve repair 08/19/2011   S/P CABG x 2 08/19/2011   Severe mitral regurgitation 08/12/2011   CHF (congestive heart failure) (HCC)    MR (mitral regurgitation)    Cataract    Dermatochalasis    Hypercholesterolemia     Systolic murmur    Nephrolithiasis    PVC (premature ventricular contraction)    Pseudophakia    Coronary artery disease 08/10/2011    PCP: [name], MD REFERRING PROVIDER: Naomie Dean, MD  REFERRING DIAG:  R42 (ICD-10-CM) - Dizziness  R42 (ICD-10-CM) - Vertigo    THERAPY DIAG:  Unsteadiness on feet - Plan: PT plan of care cert/re-cert  Dizziness and giddiness - Plan: PT plan of care cert/re-cert  ONSET DATE: 05/29/22 referral  Rationale for Evaluation and Treatment: Rehabilitation  SUBJECTIVE:   SUBJECTIVE STATEMENT: Patient arrives to clinic by himself, no AD. He complains of dizziness. Patient reports that when he scoots back on the pillow in bed, he gets dizzy. Reports that standing still for long periods of time he'll feel "faint." Will feel dizzy when first sitting up in bed and so has to sit still for a little time before standing up and walking. When he lays flat, he reports spinning dizziness. Reports that this has been going on for ~6 months. Thinks it may be related to neck stiffness, but relies on his neck to help him with bed mobility.  Pt accompanied by: self  PERTINENT HISTORY: GERD, CA, HLD, CHF  PAIN:  Are you having pain? No  PRECAUTIONS: Fall  WEIGHT BEARING RESTRICTIONS: No  FALLS: Has patient fallen in last 6 months? No  LIVING ENVIRONMENT: Lives with: lives alone Lives in: House/apartment Stairs: Yes: External: 4 steps; on right going up Has following equipment at home: Single point cane, Walker - 2 wheeled, Environmental consultant - 4 wheeled, Wheelchair (manual), shower chair, and Grab bars  PLOF: Independent  PATIENT GOALS: "to not be dizzy"  OBJECTIVE:   COGNITION: Overall cognitive status: Within functional limits for tasks assessed   SENSATION: WFL  EDEMA:  Noted some dependent edema in B LE  POSTURE:  rounded shoulders, forward head, increased thoracic kyphosis, and flexed trunk   Cervical ROM:   Generally limited  STRENGTH:  WFL  BED MOBILITY:  Reportedly independent, if already dizzy- rolling over in bed will make it worse  GAIT: Gait pattern: WFL  PATIENT SURVEYS:  FOTO 46; expected to be 59  VESTIBULAR ASSESSMENT:  GENERAL OBSERVATION: NAD   SYMPTOM BEHAVIOR:  Subjective history: see above  Non-Vestibular symptoms: loss of consciousness  Type of dizziness: Spinning/Vertigo and Lightheadedness/Faint  Frequency: when he uses his neck to help him scoot in bed, sitting up too fast, standing still for too long   Duration: 5-54mins   Aggravating factors: Induced by position change: lying supine and Occurs when standing still   Relieving factors: closing eyes  and slow movements  Progression of symptoms: unchanged  OCULOMOTOR EXAM:  Ocular Alignment: normal  Ocular ROM: No Limitations  Spontaneous Nystagmus: absent  Gaze-Induced Nystagmus: absent  Smooth Pursuits: intact  Saccades: intact   VESTIBULAR - OCULAR REFLEX:   To be assessed PRN   POSITIONAL TESTING: Right Roll Test: no nystagmus Left Roll Test: no nystagmus Right Sidelying: upbeating, right nystagmus Left Sidelying: no nystagmus  MOTION SENSITIVITY:  Motion Sensitivity Quotient Intensity: 0 = none, 1 = Lightheaded, 2 = Mild, 3 = Moderate, 4 = Severe, 5 = Vomiting  Intensity  1. Sitting to supine 1  2. Supine to L side 0  3. Supine to R side 0  4. Supine to sitting 1  5. L Hallpike-Dix   6. Up from L    7. R Hallpike-Dix   8. Up from R    9. Sitting, head tipped to L knee   10. Head up from L knee   11. Sitting, head tipped to R knee   12. Head up from R knee   13. Sitting head turns x5   14.Sitting head nods x5   15. In stance, 180 turn to L    16. In stance, 180 turn to R     OTHOSTATICS:  Supine: 135/69 Sit: 113/61 Standing: 114/59    VESTIBULAR TREATMENT:                                                                                                   Deferred until patient has reliable transportation  home  PATIENT EDUCATION: Education details: PT POC, exam findings, pathophysiology of BPPV, adequate hydration, brandt daroff Person educated: Patient Education method: Explanation, Demonstration, and Handouts Education comprehension: verbalized understanding and needs further education  HOME EXERCISE PROGRAM: Sit to Side-Lying    Sit on edge of bed. 1. Turn head 45 to right. 2. Maintain head position and lie down slowly on left side. Hold until symptoms subside. 3. Sit up slowly. Hold until symptoms subside. 4. Turn head 45 to left. 5. Maintain head position and lie down slowly on right side. Hold until symptoms subside. 6. Sit up slowly. Repeat sequence __3__ times per side per session. Do __3__ sessions per day. GOALS: Goals reviewed with patient? Yes  SHORT TERM GOALS: = LTG based on PT POC  LONG TERM GOALS: Target date: 07/02/22  Pt will be independent with final HEP for improved symptom report  Baseline: to be updated  Goal status: INITIAL  2.  Patient will demonstrate (-) positional testing to indicate resolution of BPPV  Baseline: (+) R posterior canal canalithiasis  Goal status: INITIAL  3.  M-CTSIB goal Baseline: to be assessed Goal status: INITIAL  4.  Patient will improve FOTO score to >/= 59 to demonstrate reduction in symptoms Baseline: 45 Goal status: INITIAL  ASSESSMENT:  CLINICAL IMPRESSION: Patient is a 89 y.o. male who was seen today for physical therapy evaluation and treatment for dizziness. His dizziness caused by gross positional changes (supine> sit> stand) is likely explained by orthostatic hypotension. PT educating patient on adequate hydration levels and  slow transitional movements. B roll test (-). R sidelying test did elicit R torsional upbeating nystagmus with ~2s latency lasting ~15-20s with patient subjective report of room spinning. L sidelying test (-). Positional testing indicative of R posterior canal canalithiasis. PT deferring canalith  repositioning maneuver until patient has a safe, reliable way to get home. Patient agreeable to this. He would benefit from skilled PT services to address the above mentioned deficits.   OBJECTIVE IMPAIRMENTS: decreased knowledge of condition and dizziness.   ACTIVITY LIMITATIONS: sleeping, bed mobility, and locomotion level  PARTICIPATION LIMITATIONS: driving, community activity, and yard work  PERSONAL FACTORS: Age, Fitness, Transportation, and 3+ comorbidities: see above  are also affecting patient's functional outcome.   REHAB POTENTIAL: Good  CLINICAL DECISION MAKING: Stable/uncomplicated  EVALUATION COMPLEXITY: Low   PLAN:  PT FREQUENCY: 1-2x/week  PT DURATION: 4 weeks  PLANNED INTERVENTIONS: Therapeutic exercises, Therapeutic activity, Neuromuscular re-education, Balance training, Gait training, Patient/Family education, Self Care, Joint mobilization, Vestibular training, Canalith repositioning, Visual/preceptual remediation/compensation, DME instructions, Aquatic Therapy, Manual therapy, and Re-evaluation  PLAN FOR NEXT SESSION: re-assess R posterior canal- will need bed in trendelenburg if doing Epley    Westley Foots, PT, DPT, CBIS 06/02/2022, 3:06 PM

## 2022-06-09 ENCOUNTER — Ambulatory Visit: Payer: Medicare Other

## 2022-06-09 VITALS — BP 110/58

## 2022-06-09 DIAGNOSIS — R42 Dizziness and giddiness: Secondary | ICD-10-CM

## 2022-06-09 DIAGNOSIS — R2681 Unsteadiness on feet: Secondary | ICD-10-CM | POA: Diagnosis not present

## 2022-06-09 NOTE — Therapy (Signed)
OUTPATIENT PHYSICAL THERAPY VESTIBULAR EVALUATION     Patient Name: [name] [name] MRN: [id number] DOB:June 25, 1929, 89 y.o., male Today's Date: 06/09/2022  END OF SESSION:  PT End of Session - 06/09/22 0826     Visit Number 2    Number of Visits 9    Date for PT Re-Evaluation 07/02/22    Authorization Type [employer] medicare    PT Start Time 0845    PT Stop Time 0926    PT Time Calculation (min) 41 min    Activity Tolerance Patient tolerated treatment well    Behavior During Therapy [facility] for tasks assessed/performed             Past Medical History:  Diagnosis Date   Arthritis    hands   Cancer (HCC)    colon cancer 1984   Cataract    CHF (congestive heart failure) (HCC)    takes Lasix daily   Coronary artery disease 08/10/2011   Dermatochalasis    Dislocated wrist    left   Diverticulosis    Dyslipidemia    takes Niacin daily   GERD (gastroesophageal reflux disease)    sometimes d/t food   H/O hiatal hernia    History of colon polyps    History of kidney stones    History of seasonal allergies    takes OTC allergy meds prn   Hx: recurrent pneumonia    Hypercholesterolemia    Hyperlipidemia    MR (mitral regurgitation)    MVP (mitral valve prolapse)    Pneumonia    early July 2013   Pseudophakia    PVC (premature ventricular contraction)    S/P CABG x 2 08/19/2011   LIMA to LAD, SVG to RCA, EVH via left thigh   S/P mitral valve repair 08/19/2011   Complex valvuloplasty including triangular resection of posterior leaflet, artificial Goretex neocord placement x6 and 38mm Sorin Memo 3D ring annuloplasty   Shortness of breath    with exertion/lying/sitting   Systolic murmur    Thyroid nodule 08/18/2011   3 cm nodule discovered on chest CT scan   Urinary frequency    Urinary urgency    Past Surgical History:  Procedure Laterality Date   BUNIONECTOMY     CARDIAC CATHETERIZATION  08/10/2011   CATARACT EXTRACTION     bilateral   CIRCUMCISION  12/2019    COLONOSCOPY     CORONARY ARTERY BYPASS GRAFT  08/19/2011   Procedure: CORONARY ARTERY BYPASS GRAFTING (CABG);  Surgeon: Purcell Nails, MD;  Location: [facility] OR;  Service: Open Heart Surgery;  Laterality: N/A;  Coronary Artery Bypass Grafting times two using left internal mammary artery and left greater saphenous vein endoscopiclly harvested   CYSTOSCOPY     with manipulation of Ureteral Calculus   ESOPHAGOGASTRODUODENOSCOPY     with dilitation   HERNIA REPAIR     LITHOTRIPSY     Whole body extracorporeal shock wave   LITHOTRIPSY     Left ESL PUA GPE 01/03/2006,04/03/2006   MITRAL VALVE REPAIR  08/19/2011   Procedure: MITRAL VALVE REPAIR (MVR);  Surgeon: Purcell Nails, MD;  Location: [facility] OR;  Service: Open Heart Surgery;  Laterality: N/A;   pace maker     [place] medication   pacemaker  12/2021   partial colecotomy  01/18/1982   PARTIAL COLECTOMY  1984   REVERSE SHOULDER ARTHROPLASTY Left 12/13/2017   Procedure: LEFT REVERSE SHOULDER ARTHROPLASTY;  Surgeon: Cammy Copa, MD;  Location: [facility] OR;  Service:  Orthopedics;  Laterality: Left;   Patient Active Problem List   Diagnosis Date Noted   Hypokalemia 05/25/2022   History of colon cancer 05/25/2022   Paroxysmal atrial fibrillation (HCC) 05/25/2022   Benign prostatic hyperplasia with lower urinary tract symptoms 05/25/2022   Chronic kidney disease (CKD), stage III (moderate) (HCC) 05/25/2022   Syncope 07/23/2019   Fall 07/23/2019   Cerebellar infarction (HCC) 07/03/2018   Shoulder arthritis 12/13/2017   Rotator cuff arthropathy of left shoulder    S/P MVR (mitral valve repair) 10/11/2011   S/P CABG (coronary artery bypass graft) 10/11/2011   Ventricular tachycardia (HCC) 08/25/2011   S/P mitral valve repair 08/19/2011   S/P CABG x 2 08/19/2011   Severe mitral regurgitation 08/12/2011   CHF (congestive heart failure) (HCC)    MR (mitral regurgitation)    Cataract    Dermatochalasis    Hypercholesterolemia     Systolic murmur    Nephrolithiasis    PVC (premature ventricular contraction)    Pseudophakia    Coronary artery disease 08/10/2011    PCP: [name], MD REFERRING PROVIDER: Naomie Dean, MD  REFERRING DIAG:  R42 (ICD-10-CM) - Dizziness  R42 (ICD-10-CM) - Vertigo    THERAPY DIAG:  Unsteadiness on feet  Dizziness and giddiness  ONSET DATE: 05/29/22 referral  Rationale for Evaluation and Treatment: Rehabilitation  SUBJECTIVE:   SUBJECTIVE STATEMENT: Patient arrives to clinic with family member. Reports some dizziness since last appt, but states it's not spinning dizziness. When he lays down in bed "it doesn't like that" and it lasts ~15-20 mins. Denies falls/near falls.  Pt accompanied by: self  PERTINENT HISTORY: GERD, CA, HLD, CHF  PAIN:  Are you having pain? No  Vitals:   06/09/22 0853 06/09/22 0854  BP: 119/70 (!) 110/58     PRECAUTIONS: Fall  PATIENT GOALS: "to not be dizzy"  VESTIBULAR ASSESSMENT:  VESTIBULAR - OCULAR REFLEX:   To be assessed PRN   POSITIONAL TESTING: Right Dix-Hallpike: upbeating, right nystagmus Right Sidelying: upbeating, right nystagmus  MOTION SENSITIVITY:  Motion Sensitivity Quotient Intensity: 0 = none, 1 = Lightheaded, 2 = Mild, 3 = Moderate, 4 = Severe, 5 = Vomiting  Intensity  1. Sitting to supine 1  2. Supine to L side 0  3. Supine to R side 0  4. Supine to sitting 1  5. L Hallpike-Dix   6. Up from L    7. R Hallpike-Dix 3  8. Up from R  1  9. Sitting, head tipped to L knee   10. Head up from L knee   11. Sitting, head tipped to R knee   12. Head up from R knee   13. Sitting head turns x5   14.Sitting head nods x5   15. In stance, 180 turn to L    16. In stance, 180 turn to R      VESTIBULAR TREATMENT:                                                                                                   -Epley x2 for R posterior canal  canalithiasis   -~2-3s latency lasting ~10s   -initial treatment with R  upbeating torsional nystagmus   -2nd epley with possible conversion to horizontal?  -Retested R dixhallpike with no nystagmus and no reports of dizziness   PATIENT EDUCATION: Education details: exam findings, brandt daroff, hydration, cervical posture Person educated: Patient Education method: Explanation, Demonstration, and Handouts Education comprehension: verbalized understanding and needs further education  HOME EXERCISE PROGRAM: Sit to Side-Lying    Sit on edge of bed. 1. Turn head 45 to right. 2. Maintain head position and lie down slowly on left side. Hold until symptoms subside. 3. Sit up slowly. Hold until symptoms subside. 4. Turn head 45 to left. 5. Maintain head position and lie down slowly on right side. Hold until symptoms subside. 6. Sit up slowly. Repeat sequence __3__ times per side per session. Do __3__ sessions per day.  *if dizziness increases while doing this, stop the exercise and try again later  **do all the repetitions to 1 side first before moving on to the 2nd side  **prioritize doing this to the right side first GOALS: Goals reviewed with patient? Yes  SHORT TERM GOALS: = LTG based on PT POC  LONG TERM GOALS: Target date: 07/02/22  Pt will be independent with final HEP for improved symptom report  Baseline: to be updated  Goal status: INITIAL  2.  Patient will demonstrate (-) positional testing to indicate resolution of BPPV  Baseline: (+) R posterior canal canalithiasis  Goal status: INITIAL  3.  M-CTSIB goal Baseline: to be assessed Goal status: INITIAL  4.  Patient will improve FOTO score to >/= 59 to demonstrate reduction in symptoms Baseline: 45 Goal status: INITIAL  ASSESSMENT:  CLINICAL IMPRESSION: Patient seen for skilled PT session with emphasis on positional testing and canalith repositioning. Patient presenting with R posterior canal canalithiasis. Treated with Epley x2 with possible conversion to horizontal canalithiasis on 2nd  treatment with noted pure horizontal nystagmus, but upon final re-check, patient with resolved s/s. He may still have a resultant R vestibular hypofunction. Continue POC.   OBJECTIVE IMPAIRMENTS: decreased knowledge of condition and dizziness.   ACTIVITY LIMITATIONS: sleeping, bed mobility, and locomotion level  PARTICIPATION LIMITATIONS: driving, community activity, and yard work  PERSONAL FACTORS: Age, Fitness, Transportation, and 3+ comorbidities: see above  are also affecting patient's functional outcome.   REHAB POTENTIAL: Good  CLINICAL DECISION MAKING: Stable/uncomplicated  EVALUATION COMPLEXITY: Low   PLAN:  PT FREQUENCY: 1-2x/week  PT DURATION: 4 weeks  PLANNED INTERVENTIONS: Therapeutic exercises, Therapeutic activity, Neuromuscular re-education, Balance training, Gait training, Patient/Family education, Self Care, Joint mobilization, Vestibular training, Canalith repositioning, Visual/preceptual remediation/compensation, DME instructions, Aquatic Therapy, Manual therapy, and Re-evaluation  PLAN FOR NEXT SESSION: re-assess R posterior canal- will need bed in trendelenburg if doing Epley; assess for further vestibular dysfunction PRN   Westley Foots, PT, DPT, CBIS 06/09/2022, 9:36 AM

## 2022-06-16 ENCOUNTER — Ambulatory Visit: Payer: Medicare Other

## 2022-06-24 ENCOUNTER — Ambulatory Visit: Payer: Medicare Other | Attending: Neurology | Admitting: Physical Therapy

## 2022-06-24 ENCOUNTER — Encounter: Payer: Self-pay | Admitting: Physical Therapy

## 2022-06-24 DIAGNOSIS — R42 Dizziness and giddiness: Secondary | ICD-10-CM | POA: Insufficient documentation

## 2022-06-24 DIAGNOSIS — R2681 Unsteadiness on feet: Secondary | ICD-10-CM | POA: Diagnosis present

## 2022-06-24 NOTE — Therapy (Signed)
OUTPATIENT PHYSICAL THERAPY VESTIBULAR TREATMENT    Patient Name: [name] [name] MRN: [id number] DOB:06-Dec-1929, 89 y.o., male Today's Date: 06/24/2022  END OF SESSION:  PT End of Session - 06/24/22 0933     Visit Number 3    Number of Visits 9    Date for PT Re-Evaluation 07/02/22    Authorization Type [employer] medicare    PT Start Time [phone]    PT Stop Time 1011    PT Time Calculation (min) 40 min    Activity Tolerance Patient tolerated treatment well    Behavior During Therapy [facility] for tasks assessed/performed             Past Medical History:  Diagnosis Date   Arthritis    hands   Cancer (HCC)    colon cancer 1984   Cataract    CHF (congestive heart failure) (HCC)    takes Lasix daily   Coronary artery disease 08/10/2011   Dermatochalasis    Dislocated wrist    left   Diverticulosis    Dyslipidemia    takes Niacin daily   GERD (gastroesophageal reflux disease)    sometimes d/t food   H/O hiatal hernia    History of colon polyps    History of kidney stones    History of seasonal allergies    takes OTC allergy meds prn   Hx: recurrent pneumonia    Hypercholesterolemia    Hyperlipidemia    MR (mitral regurgitation)    MVP (mitral valve prolapse)    Pneumonia    early July 2013   Pseudophakia    PVC (premature ventricular contraction)    S/P CABG x 2 08/19/2011   LIMA to LAD, SVG to RCA, EVH via left thigh   S/P mitral valve repair 08/19/2011   Complex valvuloplasty including triangular resection of posterior leaflet, artificial Goretex neocord placement x6 and 38mm Sorin Memo 3D ring annuloplasty   Shortness of breath    with exertion/lying/sitting   Systolic murmur    Thyroid nodule 08/18/2011   3 cm nodule discovered on chest CT scan   Urinary frequency    Urinary urgency    Past Surgical History:  Procedure Laterality Date   BUNIONECTOMY     CARDIAC CATHETERIZATION  08/10/2011   CATARACT EXTRACTION     bilateral   CIRCUMCISION  12/2019    COLONOSCOPY     CORONARY ARTERY BYPASS GRAFT  08/19/2011   Procedure: CORONARY ARTERY BYPASS GRAFTING (CABG);  Surgeon: Purcell Nails, MD;  Location: [facility] OR;  Service: Open Heart Surgery;  Laterality: N/A;  Coronary Artery Bypass Grafting times two using left internal mammary artery and left greater saphenous vein endoscopiclly harvested   CYSTOSCOPY     with manipulation of Ureteral Calculus   ESOPHAGOGASTRODUODENOSCOPY     with dilitation   HERNIA REPAIR     LITHOTRIPSY     Whole body extracorporeal shock wave   LITHOTRIPSY     Left ESL PUA GPE 01/03/2006,04/03/2006   MITRAL VALVE REPAIR  08/19/2011   Procedure: MITRAL VALVE REPAIR (MVR);  Surgeon: Purcell Nails, MD;  Location: [facility] OR;  Service: Open Heart Surgery;  Laterality: N/A;   pace maker     [place]   pacemaker  12/2021   partial colecotomy  01/18/1982   PARTIAL COLECTOMY  1984   REVERSE SHOULDER ARTHROPLASTY Left 12/13/2017   Procedure: LEFT REVERSE SHOULDER ARTHROPLASTY;  Surgeon: Cammy Copa, MD;  Location: [facility] OR;  Service: Orthopedics;  Laterality: Left;   Patient Active Problem List   Diagnosis Date Noted   Hypokalemia 05/25/2022   History of colon cancer 05/25/2022   Paroxysmal atrial fibrillation (HCC) 05/25/2022   Benign prostatic hyperplasia with lower urinary tract symptoms 05/25/2022   Chronic kidney disease (CKD), stage III (moderate) (HCC) 05/25/2022   Syncope 07/23/2019   Fall 07/23/2019   Cerebellar infarction (HCC) 07/03/2018   Shoulder arthritis 12/13/2017   Rotator cuff arthropathy of left shoulder    S/P MVR (mitral valve repair) 10/11/2011   S/P CABG (coronary artery bypass graft) 10/11/2011   Ventricular tachycardia (HCC) 08/25/2011   S/P mitral valve repair 08/19/2011   S/P CABG x 2 08/19/2011   Severe mitral regurgitation 08/12/2011   CHF (congestive heart failure) (HCC)    MR (mitral regurgitation)    Cataract    Dermatochalasis    Hypercholesterolemia    Systolic  murmur    Nephrolithiasis    PVC (premature ventricular contraction)    Pseudophakia    Coronary artery disease 08/10/2011    PCP: [name], MD REFERRING PROVIDER: Naomie Dean, MD  REFERRING DIAG:  R42 (ICD-10-CM) - Dizziness  R42 (ICD-10-CM) - Vertigo    THERAPY DIAG:  Dizziness and giddiness  Unsteadiness on feet  ONSET DATE: 05/29/22 referral  Rationale for Evaluation and Treatment: Rehabilitation  SUBJECTIVE:   SUBJECTIVE STATEMENT: Reports neck feels stiff. Has not had episodes of dizziness "not when he started this journey". Notices balance is different - tries to be very careful.  Pt accompanied by: self  PERTINENT HISTORY: GERD, CA, HLD, CHF  PAIN:  Are you having pain? No  There were no vitals filed for this visit.    PRECAUTIONS: Fall  PATIENT GOALS: "to not be dizzy"    VESTIBULAR TREATMENT:                                                                                                   POSITIONAL TESTING: Right Dix-Hallpike: no nystagmus Right Sidelying: no nystagmus Left Sidelying: no nystagmus  Dix-Hallpike perform with bed in Trendelenberg due to decr extension   VESTIBULAR - OCULAR REFLEX:   Slow VOR: Normal  VOR Cancellation: Normal  HIT: Unable to accurately test, pt unable to relax neck     M-CTSIB  Condition 1: Firm Surface, EO 30 Sec, Normal Sway  Condition 2: Firm Surface, EC 30 Sec, Normal Sway  Condition 3: Foam Surface, EO 30 Sec, Normal Sway  Condition 4: Foam Surface, EC 3 Sec      Access Code: [email or web address] URL: [email or web address] Date: 06/24/2022 Prepared by: Sherlie Ban  Initiated HEP for balance:   Exercises - Wide Stance with Eyes Closed on Foam Pad  - 1 x daily - 7 x weekly - 3 sets - 30 hold - Romberg Stance on Foam Pad  - 1 x daily - 7 x weekly - 2 sets - 10 reps - head turns and head nods   PATIENT EDUCATION: Education details: Resolution of BPPV, education on etiology and  reoccurrence rate of BPPV and provided handout, results of mCTSIB and purpose of vestibular  system for balance, added to HEP, discussed likely plan for D/C at next session, discussed that if BPPV returns in the future  can get a new referral to return  Person educated: Patient Education method: Explanation, Demonstration, and Handouts Education comprehension: verbalized understanding and needs further education  HOME EXERCISE PROGRAM: Sit to Side-Lying    Sit on edge of bed. 1. Turn head 45 to right. 2. Maintain head position and lie down slowly on left side. Hold until symptoms subside. 3. Sit up slowly. Hold until symptoms subside. 4. Turn head 45 to left. 5. Maintain head position and lie down slowly on right side. Hold until symptoms subside. 6. Sit up slowly. Repeat sequence __3__ times per side per session. Do __3__ sessions per day.  *if dizziness increases while doing this, stop the exercise and try again later  **do all the repetitions to 1 side first before moving on to the 2nd side  **prioritize doing this to the right side first   Access Code: [email or web address] URL: [email or web address] Date: 06/24/2022 Prepared by: Sherlie Ban  Exercises - Wide Stance with Eyes Closed on Foam Pad  - 1 x daily - 7 x weekly - 3 sets - 30 hold - Romberg Stance on Foam Pad  - 1 x daily - 7 x weekly - 3 sets - 10 reps  Patient Education - What Is BPPV?  GOALS: Goals reviewed with patient? Yes  SHORT TERM GOALS: = LTG based on PT POC  LONG TERM GOALS: Target date: 07/02/22  Pt will be independent with final HEP for improved symptom report  Baseline: to be updated  Goal status: INITIAL  2.  Patient will demonstrate (-) positional testing to indicate resolution of BPPV  Baseline: (+) R posterior canal canalithiasis  Goal status: INITIAL  3. Pt will improve condition 4 of mCTSIB to at least 8 seconds in order to demo improved vestibular input for balance.  Baseline: 3  seconds.  Goal status: INITIAL  4.  Patient will improve FOTO score to >/= 59 to demonstrate reduction in symptoms Baseline: 45 Goal status: INITIAL  ASSESSMENT:  CLINICAL IMPRESSION:  Re-assessed positional testing with pt negative for R posterior canalithiasis today. Pt with no nystagmus or no dizziness. Pt also reporting no dizziness episodes at home. Provided extensive education regarding BPPV and that it is now cleared. Remainder of session focused on further assessment for vestibular dysfunction. Unable to assess VOR as pt unable to relax his neck. With m-CTSIB, pt only able to hold condition 4 for 3 seconds, indicating decr vestibular input for balance. Added to balance for HEP for EC and head motions. Will plan for likely D/C at next session if BPPV stays cleared, with pt in agreement with plan. Will continue per POC.    OBJECTIVE IMPAIRMENTS: decreased knowledge of condition and dizziness.   ACTIVITY LIMITATIONS: sleeping, bed mobility, and locomotion level  PARTICIPATION LIMITATIONS: driving, community activity, and yard work  PERSONAL FACTORS: Age, Fitness, Transportation, and 3+ comorbidities: see above  are also affecting patient's functional outcome.   REHAB POTENTIAL: Good  CLINICAL DECISION MAKING: Stable/uncomplicated  EVALUATION COMPLEXITY: Low   PLAN:  PT FREQUENCY: 1-2x/week  PT DURATION: 4 weeks  PLANNED INTERVENTIONS: Therapeutic exercises, Therapeutic activity, Neuromuscular re-education, Balance training, Gait training, Patient/Family education, Self Care, Joint mobilization, Vestibular training, Canalith repositioning, Visual/preceptual remediation/compensation, DME instructions, Aquatic Therapy, Manual therapy, and Re-evaluation  PLAN FOR NEXT SESSION: check goals, any return of BPPV? Likely D/C    Drake Leach, PT,  DPT 06/24/2022, 10:54 AM

## 2022-06-30 ENCOUNTER — Encounter: Payer: Self-pay | Admitting: Physical Therapy

## 2022-06-30 ENCOUNTER — Ambulatory Visit: Payer: Medicare Other | Admitting: Physical Therapy

## 2022-06-30 DIAGNOSIS — R42 Dizziness and giddiness: Secondary | ICD-10-CM

## 2022-06-30 DIAGNOSIS — R2681 Unsteadiness on feet: Secondary | ICD-10-CM

## 2022-06-30 NOTE — Therapy (Signed)
OUTPATIENT PHYSICAL THERAPY VESTIBULAR TREATMENT/DISCHARGE SUMMARY    Patient Name: [name] [name] MRN: [id number] DOB:November 03, 1929, 89 y.o., male Today's Date: 06/30/2022  END OF SESSION:  PT End of Session - 06/30/22 0932     Visit Number 4    Number of Visits 9    Date for PT Re-Evaluation 07/02/22    Authorization Type [employer]HC medicare    PT Start Time 0930    PT Stop Time 0950   full time not used due to D/C visit   PT Time Calculation (min) 20 min    Activity Tolerance Patient tolerated treatment well    Behavior During Therapy [facility] for tasks assessed/performed             Past Medical History:  Diagnosis Date   Arthritis    hands   Cancer (HCC)    colon cancer 1984   Cataract    CHF (congestive heart failure) (HCC)    takes Lasix daily   Coronary artery disease 08/10/2011   Dermatochalasis    Dislocated wrist    left   Diverticulosis    Dyslipidemia    takes Niacin daily   GERD (gastroesophageal reflux disease)    sometimes d/t food   H/O hiatal hernia    History of colon polyps    History of kidney stones    History of seasonal allergies    takes OTC allergy meds prn   Hx: recurrent pneumonia    Hypercholesterolemia    Hyperlipidemia    MR (mitral regurgitation)    MVP (mitral valve prolapse)    Pneumonia    early July 2013   Pseudophakia    PVC (premature ventricular contraction)    S/P CABG x 2 08/19/2011   LIMA to LAD, SVG to RCA, EVH via left thigh   S/P mitral valve repair 08/19/2011   Complex valvuloplasty including triangular resection of posterior leaflet, artificial Goretex neocord placement x6 and 38mm Sorin Memo 3D ring annuloplasty   Shortness of breath    with exertion/lying/sitting   Systolic murmur    Thyroid nodule 08/18/2011   3 cm nodule discovered on chest CT scan   Urinary frequency    Urinary urgency    Past Surgical History:  Procedure Laterality Date   BUNIONECTOMY     CARDIAC CATHETERIZATION  08/10/2011   CATARACT  EXTRACTION     bilateral   CIRCUMCISION  12/2019   COLONOSCOPY     CORONARY ARTERY BYPASS GRAFT  08/19/2011   Procedure: CORONARY ARTERY BYPASS GRAFTING (CABG);  Surgeon: Purcell Nails, MD;  Location: [facility] OR;  Service: Open Heart Surgery;  Laterality: N/A;  Coronary Artery Bypass Grafting times two using left internal mammary artery and left greater saphenous vein endoscopiclly harvested   CYSTOSCOPY     with manipulation of Ureteral Calculus   ESOPHAGOGASTRODUODENOSCOPY     with dilitation   HERNIA REPAIR     LITHOTRIPSY     Whole body extracorporeal shock wave   LITHOTRIPSY     Left ESL PUA GPE 01/03/2006,04/03/2006   MITRAL VALVE REPAIR  08/19/2011   Procedure: MITRAL VALVE REPAIR (MVR);  Surgeon: Purcell Nails, MD;  Location: [facility] OR;  Service: Open Heart Surgery;  Laterality: N/A;   pace maker     [place] medication   pacemaker  12/2021   partial colecotomy  01/18/1982   PARTIAL COLECTOMY  1984   REVERSE SHOULDER ARTHROPLASTY Left 12/13/2017   Procedure: LEFT REVERSE SHOULDER ARTHROPLASTY;  Surgeon: August Saucer,  Corrie Mckusick, MD;  Location: [facility] OR;  Service: Orthopedics;  Laterality: Left;   Patient Active Problem List   Diagnosis Date Noted   Hypokalemia 05/25/2022   History of colon cancer 05/25/2022   Paroxysmal atrial fibrillation (HCC) 05/25/2022   Benign prostatic hyperplasia with lower urinary tract symptoms 05/25/2022   Chronic kidney disease (CKD), stage III (moderate) (HCC) 05/25/2022   Syncope 07/23/2019   Fall 07/23/2019   Cerebellar infarction (HCC) 07/03/2018   Shoulder arthritis 12/13/2017   Rotator cuff arthropathy of left shoulder    S/P MVR (mitral valve repair) 10/11/2011   S/P CABG (coronary artery bypass graft) 10/11/2011   Ventricular tachycardia (HCC) 08/25/2011   S/P mitral valve repair 08/19/2011   S/P CABG x 2 08/19/2011   Severe mitral regurgitation 08/12/2011   CHF (congestive heart failure) (HCC)    MR (mitral regurgitation)    Cataract     Dermatochalasis    Hypercholesterolemia    Systolic murmur    Nephrolithiasis    PVC (premature ventricular contraction)    Pseudophakia    Coronary artery disease 08/10/2011    PCP: [name], MD REFERRING PROVIDER: Naomie Dean, MD  REFERRING DIAG:  R42 (ICD-10-CM) - Dizziness  R42 (ICD-10-CM) - Vertigo    THERAPY DIAG:  Dizziness and giddiness  Unsteadiness on feet  ONSET DATE: 05/29/22 referral  Rationale for Evaluation and Treatment: Rehabilitation  SUBJECTIVE:   SUBJECTIVE STATEMENT: Pt reports no dizziness episodes. Has not tried the balance exercises at home. Has been trying to drink more water at home. Can tell its been making a difference.   Pt accompanied by: self  PERTINENT HISTORY: GERD, CA, HLD, CHF  PAIN:  Are you having pain? No  There were no vitals filed for this visit.   PRECAUTIONS: Fall  PATIENT GOALS: "to not be dizzy"    VESTIBULAR TREATMENT:                                                                                                    FOTO:  DPS: 70 DFS: 67.4  Pt no longer dizzy and pt negative for positional testing. Educated pt on [employer] maneuver if BPPV/dizziness does return in the future, then pt can try this exercise at home, but if dizziness remains then can get a new referral from his physician to return to PT. Gave pt handout with these instructions for pt to also show son for incr carryover.   PATIENT EDUCATION: Education details: Reviewed etiology and reoccurrence rate of BPPV, results of FOTO, D/C from PT, Austin Miles exercises in case BPPV returns in the future this is something pt can try - or if it doesn't go away or if pt doesn't want to try, then can get a new referral to return.  Person educated: Patient Education method: Explanation, Demonstration, and Handouts Education comprehension: verbalized understanding and needs further education  HOME EXERCISE PROGRAM:  Access Code: [email or web address] URL:  [email or web address] Date: 06/30/2022 Prepared by: Sherlie Ban  Exercises - Wide Stance with Eyes Closed on Foam Pad  - 1 x daily - 7 x weekly -  3 sets - 30 hold - Romberg Stance on Foam Pad  - 1 x daily - 7 x weekly - 2 sets - 10 reps - Brandt-Daroff Vestibular Exercise  - 1 x daily - 7 x weekly - 2 sets - 5 reps  Patient Education - What Is BPPV?   PHYSICAL THERAPY DISCHARGE SUMMARY  Visits from Start of Care: 4  Current functional level related to goals / functional outcomes: See LTGs/Clinical Assessment Statement   Remaining deficits: Postural abnormalities, impaired balance.    Education / Equipment: HEP, BPPV education    Patient agrees to discharge. Patient goals were met/not met. Patient is being discharged due to meeting the stated rehab goals. And pt not longer having dizziness and pt with resolution of BPPV.    GOALS: Goals reviewed with patient? Yes  SHORT TERM GOALS: = LTG based on PT POC  LONG TERM GOALS: Target date: 07/02/22  Pt will be independent with final HEP for improved symptom report  Baseline: pt not performing HEP for balance Goal status: NOT MET  2.  Patient will demonstrate (-) positional testing to indicate resolution of BPPV  Baseline: negative positional testing  Goal status: MET  3. Pt will improve condition 4 of mCTSIB to at least 8 seconds in order to demo improved vestibular input for balance.  Baseline: 3 seconds.  Goal status: NOT MET  4.  Patient will improve FOTO score to >/= 59 to demonstrate reduction in symptoms Baseline: 45  70 on 06/30/22 Goal status: MET  ASSESSMENT:  CLINICAL IMPRESSION:  Pt negative for positional testing at last session and pt reporting no episodes of dizziness since he was last here. Pt met LTGs #2 and #4. Pt with significant improvement in his FOTO score, indicating improved functional outcomes in regards to pt's dizziness. Educated on balance exercises to perform at home  (given at last session) and provided Austin Miles handout for pt to try if BPPV returns in the future, or if it doesn't help, then can get a new referral to return to PT. Pt verbalized understanding. Will D/C at this time due to resolution of BPPV and pt with no dizziness at this time. Pt in agreement with plan.    OBJECTIVE IMPAIRMENTS: decreased knowledge of condition and dizziness.   ACTIVITY LIMITATIONS: sleeping, bed mobility, and locomotion level  PARTICIPATION LIMITATIONS: driving, community activity, and yard work  PERSONAL FACTORS: Age, Fitness, Transportation, and 3+ comorbidities: see above  are also affecting patient's functional outcome.   REHAB POTENTIAL: Good  CLINICAL DECISION MAKING: Stable/uncomplicated  EVALUATION COMPLEXITY: Low   PLAN:  PT FREQUENCY: 1-2x/week  PT DURATION: 4 weeks  PLANNED INTERVENTIONS: Therapeutic exercises, Therapeutic activity, Neuromuscular re-education, Balance training, Gait training, Patient/Family education, Self Care, Joint mobilization, Vestibular training, Canalith repositioning, Visual/preceptual remediation/compensation, DME instructions, Aquatic Therapy, Manual therapy, and Re-evaluation  PLAN FOR NEXT SESSION: D/C    Drake Leach, PT, DPT 06/30/2022, 10:07 AM

## 2022-08-31 ENCOUNTER — Ambulatory Visit: Payer: Medicare Other | Admitting: Neurology

## 2022-09-22 ENCOUNTER — Ambulatory Visit: Payer: Medicare Other | Admitting: Neurology

## 2022-12-14 ENCOUNTER — Encounter: Payer: Self-pay | Admitting: Neurology

## 2022-12-14 ENCOUNTER — Ambulatory Visit: Payer: Medicare Other | Admitting: Neurology

## 2022-12-14 VITALS — BP 122/58 | HR 70 | Ht 67.0 in | Wt 147.0 lb

## 2022-12-14 DIAGNOSIS — R42 Dizziness and giddiness: Secondary | ICD-10-CM

## 2022-12-14 NOTE — Progress Notes (Signed)
[facility]    Provider:  Dr Lucia Gaskins Requesting Provider: Noberto Retort, MD Primary Care Provider:  Noberto Retort, MD  CC:  Stroke  12/14/2022: Here with son who provides information. Patient states [name] [name] was very positive and vestibular therapy helped with epley maneuvers. Dizziness gone. His wife passed away right before his 38st birthday about a year ago. They were married 71 years. He says she never complained. Here with his son. Son bvisits him at [name] often. Son is here and provides much information. Also has a daughter. He lives by himself. No falls. No choking or coughing with food. BP is [name], drinking more, his biggest is trying to get hearing aids. Met his wife when he was 68 and she was 13.5 and they married when he was 61 and she was 29. He doesn't wear his hearing aids and son says he struggles to have his dad to wear.  Son is POA. He talks a lot about his grief and his wife. He went to grief therapy and is also involved with th church. Sleeps well. He says he gets through on "trust god no matter what" . Son has 3 kids. Daughter has 2 kids. He sees his grandchildren. He is tangential and slightly difficult to redirect, he perseverates on his wife, he is still grieving his wife, my heart goes out to him and his family.    Patient complains of symptoms per HPI as well as the following symptoms: grief, wife died . Pertinent negatives and positives per HPI. All others negative   Follow-up May 2024: This is a patient who is 89 years old who is here for follow-up.  We have seen him in the past for stroke last time he was seen was in December 2022. has CHF (congestive heart failure) (HCC); MR (mitral regurgitation); Cataract; Dermatochalasis; Hypercholesterolemia; Systolic murmur; Nephrolithiasis; PVC (premature ventricular contraction); Pseudophakia; Severe mitral regurgitation; Coronary artery disease; S/P mitral valve repair; S/P CABG x 2; Ventricular  tachycardia (HCC); S/P MVR (mitral valve repair); S/P CABG (coronary artery bypass graft); Shoulder arthritis; Rotator cuff arthropathy of left shoulder; Cerebellar infarction (HCC); Syncope; Fall; Hypokalemia; History of colon cancer; Paroxysmal atrial fibrillation (HCC); Benign prostatic hyperplasia with lower urinary tract symptoms; and Chronic kidney disease (CKD), stage III (moderate) (HCC) on their problem list.  I reviewed notes from a Dr. Tiburcio Pea, patient reported worsened imbalance, cerebellar disease, had an MRI of the brain completed.  Recent labs March 23, 2022 include normal thyroid, unremarkable CMP with BUN 22 and creatinine 0.86, unremarkable CBC with slightly low platelets at 146 and very minimal anemia hemoglobin 12.5.  He had an MRI 03/23/2022 because of imbalance and falling backwards and notes further go to explain that he had a recent episode when he was sitting in the bed preparing to get up and he felt a sense of the force pushing and back, it happened at another time after a while he was able to get up out of bed since this began he has felt "wobbly" although he has had no further falls.  He denies true vertigo headache or nausea.  He does have a history of vertigo and stroke seen by me and chronic old small strokes in the cerebellum.  He has a history of A-fib on Eliquis. An MRi brain was ordered 12/5 but I do not see results on EPIC, I reviewed report that was on "Care Everywhere" and it appears nothing acute and no new strokes; no acute abnormality to  explain his feelings of imbalance. Always happens in the morning. At night he gets in bed moving around.   Here with his son. He feels like he is spinning. It happens first thing in the morning when sit up on the edge of the bed. Happens with changing positons. He also feels imbalance, he has neck pain.Feels dizzy. Feels like someone is pulling him back. He has neck issues, pain in the stiff, stiffness in the neck, walking he feels  imbalanced. Usually the dizziness is when changing positions. Not drinking a lot of fluids. He hasn't eaten or drank anything today, he doesn't drink a lot of water. He doesn't drink enough water. He is also taking a fluid pill. Doesn't urinate often. His BP is low 105 systolic. Discussed Drink more water, tea, or fluids. May ask cardiology to decrease the fluid pill, he states he does not get edema anymore.    Reviewed MRI brain images: MRI brain report 03/2021: EXAM:  MRI HEAD WITHOUT CONTRAST   TECHNIQUE:  Multiplanar, multiecho pulse sequences of the brain and surrounding  structures were obtained without intravenous contrast.   COMPARISON:  01/02/2017   FINDINGS:  Brain: No acute infarct, mass effect or extra-axial collection. No  acute or chronic hemorrhage. Minimal multifocal hyperintense  T2-weight signal within the white matter. Old punctate cerebellar  infarcts are less clearly demonstrated on the current study. The  midline structures are normal.   Vascular: Major flow voids are preserved. Diminutive right vertebral  artery, unchanged   Skull and upper cervical spine: Normal calvarium and skull base.  Visualized upper cervical spine and soft tissues are normal.   Sinuses/Orbits:No paranasal sinus fluid levels or advanced mucosal  thickening. No mastoid or middle ear effusion. Normal orbits.   IMPRESSION:  1. No acute intracranial abnormality.  2. Old, tiny cerebellar infarcts   Patient complains of symptoms per HPI as well as the following symptoms: dizzy, imbalanced . Pertinent negatives and positives per HPI. All others negative   01/14/2021; No falls, no blackouts, his balance is [name], he is extremely careful. No strokes. Doing well. Appetite is good, eating well, not choking, he has gained a few pounds recently his BMI is 23, his recent bmp normal, cbc unremarkable, no strokes or TIAs, he is still on eliquis for stroke prevention, he is on B12, he went to physical  therapy in early 2022 for imbalance, he improved. His wife [name] is still going strong, she never complains, she is at [place]. He visits his wife 3-4x a day.   Interval history 01/02/2020: Patient is here today, he has a remote history of strokes and is on Eliquis for afib. His wife has FTD and she can't walk and she is at [place]. He visits very often. She was taken off of hospice because she is stable, not decompensating. He feels he doesn't have balance. He is trying to drink more fluid, it helps with his positional dizziness, his blood pressure is not dipping as low as it was. No stroke symptoms. No falls, no moe passing out spells.  Patient saw Dr. Pearlean Brownie who reviewed all his images as well. He sits with his wife all day, he does not exercise. He denies any depression (although he appears sad to me today)   HPI:  [name] [name] is a 89 y.o. male here as a follow up by Noberto Retort, MD for stroke.  He has a past medical history of hyperlipidemia, mitral valve repair, colon cancer, kidney stones,  coronary artery bypass, atrial fibrillation.  Patient has not had any clinical symptoms or strokes of TIA the last had an MRI in 2018; I reviewed images which showed remote bilateral cerebellar infarcts.  I reviewed prior notes from Dr. Pearlean Brownie and reviewed images from MRI and agree with radiology notes and also MRI images which showed hypoplastic right vertebral artery but no significant carotid stenosis and he is on long-term anticoagulation for paroxysmal A. fib which initially started following CABG surgery in 2013; strokes could be due to A. fib or post procedure and he has been stable since.  He tolerates Eliquis well.Marland Kitchen   He is here today and he reports that he he goes to see his wife in [place] in hospice for Frontotemporal Dementia, he visits 2-3x a week. He feels well, no stroke symptoms or any new symptoms except he had a syncopal event. He has been overall happy with [facility] and hospice comes daily M-F to help with care. One Sunday after lunch he was standing and all of a sudden fell backwards. Not lightheaded, he lost balance unknown why, unknown why he fell backwards, unknown loss of consciousness, he denied feeling dizzy or seizure activity, no headaches, no vomiting, no chest, no problems since then. Lately he feels a little dizzy when he stands up or walking. He doesn't drink a lot fluids during the day. He shows me a list of his blood pressures and his systolic is sometimes as low as 98 or 105. He is a poor historian. He had a kidney stone not long ago. Dizziness started the last month.   Reviewed notes, labs and imaging from outside physicians, which showed:   CT head 07/25/2019: Personallyreviewed imaging and agree with the following:   The cortical sulci, fissures and cisterns are normal in size and appearance.  Lateral, third and fourth ventricle are normal in size and appearance.  No extra-axial fluid collections are seen.  No intracranial hemorrhage.  Calcifications in bilateral vertebral and internal carotid arteries. No evidence of mass effect or midline shift.     The orbits and their contents, paranasal sinuses and calvarium are unremarkable.       IMPRESSION:    Unremarkable CT head (without). No acute findings.  Patient complains of symptoms per HPI as well as the following symptoms: circumcision . Pertinent negatives and positives per HPI. All others negative     Social History   Socioeconomic History   Marital status: Married    Spouse name: Not on file   Number of children: 2   Years of education: Not on file   Highest education level: Not on file  Occupational History   Occupation: retired, Art gallery manager [employer]  Tobacco Use   Smoking status: Former    Types: Cigarettes   Smokeless tobacco: Never   Tobacco comments:    quit 93yrs ago; pt states he smoked only 3 months.  Vaping Use   Vaping status: Never Used  Substance and Sexual  Activity   Alcohol use: No   Drug use: No   Sexual activity: Yes  Other Topics Concern   Not on file  Social History Narrative   Daughter lives with pt   Right handed   Caffeine: 1 cup max in a day    Social Determinants of Health   Financial Resource Strain: Not on file  Food Insecurity: Low Risk  (01/08/2022)   [facility]   Hunger Vital Sign    Worried About  Running Out of Food in the Last Year: Never true    Within the past 12 months, the food you bought just didn't last and you didn't have money to get more: Not on file  Transportation Needs: No Transportation Needs (01/08/2022)   [facility]   Transportation    In the past 12 months, has lack of reliable transportation kept you from medical appointments, meetings, work or from getting things needed for daily living? : No  Physical Activity: Not on file  Stress: Not on file  Social Connections: Not on file  Intimate Partner Violence: Low Risk  (01/08/2022)   [facility] visits prior to 03/20/2022., [facility] visits prior to 03/20/2022.   Safety    How often does anyone, including family and friends, physically hurt you?: Never    How often does anyone, including family and friends, insult or talk down to you?: Never    How often does anyone, including family and friends, threaten you with harm?: Never    How often does anyone, including family and friends, scream or curse at you?: Never    Family History  Problem Relation Age of Onset   Cancer Mother    Heart disease Father    Heart attack Father    Glaucoma Sister    Prostate cancer Other        Fraternal HX    Stroke Neg Hx        none known    Past Medical History:  Diagnosis Date   Arthritis    hands   Cancer (HCC)    colon cancer 1984   Cataract    CHF (congestive heart failure) (HCC)    takes Lasix daily   Coronary artery disease  08/10/2011   Dermatochalasis    Dislocated wrist    left   Diverticulosis    Dyslipidemia    takes Niacin daily   GERD (gastroesophageal reflux disease)    sometimes d/t food   H/O hiatal hernia    History of colon polyps    History of kidney stones    History of seasonal allergies    takes OTC allergy meds prn   Hx: recurrent pneumonia    Hypercholesterolemia    Hyperlipidemia    MR (mitral regurgitation)    MVP (mitral valve prolapse)    Pneumonia    early July 2013   Pseudophakia    PVC (premature ventricular contraction)    S/P CABG x 2 08/19/2011   LIMA to LAD, SVG to RCA, EVH via left thigh   S/P mitral valve repair 08/19/2011   Complex valvuloplasty including triangular resection of posterior leaflet, artificial Goretex neocord placement x6 and 38mm Sorin Memo 3D ring annuloplasty   Shortness of breath    with exertion/lying/sitting   Systolic murmur    Thyroid nodule 08/18/2011   3 cm nodule discovered on chest CT scan   Urinary frequency    Urinary urgency     Patient Active Problem List   Diagnosis Date Noted   Hypokalemia 05/25/2022   History of colon cancer 05/25/2022   Paroxysmal atrial fibrillation (HCC) 05/25/2022   Benign prostatic hyperplasia with lower urinary tract symptoms 05/25/2022   Chronic kidney disease (CKD), stage III (moderate) (HCC) 05/25/2022   Syncope 07/23/2019   Fall 07/23/2019   Cerebellar infarction (HCC) 07/03/2018   Shoulder arthritis 12/13/2017   Rotator cuff arthropathy of left shoulder  S/P MVR (mitral valve repair) 10/11/2011   S/P CABG (coronary artery bypass graft) 10/11/2011   Ventricular tachycardia (HCC) 08/25/2011   S/P mitral valve repair 08/19/2011   S/P CABG x 2 08/19/2011   Severe mitral regurgitation 08/12/2011   CHF (congestive heart failure) (HCC)    MR (mitral regurgitation)    Cataract    Dermatochalasis    Hypercholesterolemia    Systolic murmur    Nephrolithiasis    PVC (premature ventricular  contraction)    Pseudophakia    Coronary artery disease 08/10/2011    Past Surgical History:  Procedure Laterality Date   BUNIONECTOMY     CARDIAC CATHETERIZATION  08/10/2011   CATARACT EXTRACTION     bilateral   CIRCUMCISION  12/2019   COLONOSCOPY     CORONARY ARTERY BYPASS GRAFT  08/19/2011   Procedure: CORONARY ARTERY BYPASS GRAFTING (CABG);  Surgeon: Purcell Nails, MD;  Location: [facility] OR;  Service: Open Heart Surgery;  Laterality: N/A;  Coronary Artery Bypass Grafting times two using left internal mammary artery and left greater saphenous vein endoscopiclly harvested   CYSTOSCOPY     with manipulation of Ureteral Calculus   ESOPHAGOGASTRODUODENOSCOPY     with dilitation   HERNIA REPAIR     LITHOTRIPSY     Whole body extracorporeal shock wave   LITHOTRIPSY     Left ESL PUA GPE 01/03/2006,04/03/2006   MITRAL VALVE REPAIR  08/19/2011   Procedure: MITRAL VALVE REPAIR (MVR);  Surgeon: Purcell Nails, MD;  Location[facility] OR;  Service: Open Heart Surgery;  Laterality: N/A;   pace maker     [place] medication   pacemaker  12/2021   partial colecotomy  01/18/1982   PARTIAL COLECTOMY  1984   REVERSE SHOULDER ARTHROPLASTY Left 12/13/2017   Procedure: LEFT REVERSE SHOULDER ARTHROPLASTY;  Surgeon: Cammy Copa, MD;  Location: [facility] OR;  Service: Orthopedics;  Laterality: Left;    Current Outpatient Medications  Medication Sig Dispense Refill   apixaban (ELIQUIS) 5 MG TABS tablet Take 5 mg by mouth 2 (two) times daily.     aspirin EC 81 MG tablet Take 81 mg by mouth every evening.      finasteride (PROSCAR) 5 MG tablet Take 5 mg by mouth daily.     hydroxypropyl methylcellulose / hypromellose (ISOPTO TEARS / GONIOVISC) 2.5 % ophthalmic solution Place 1 drop into both eyes as needed for dry eyes.     KRILL OIL PO Take by mouth 2 (two) times daily.     Multiple Vitamins-Minerals (OCUVITE ADULT 50+ PO) Take by mouth daily.     No current facility-administered medications for this  visit.    Allergies as of 12/14/2022 - Review Complete 12/14/2022  Allergen Reaction Noted   Hibiclens [chlorhexidine gluconate]  05/25/2022   Ivp dye [iodinated contrast media] Rash 08/12/2011   Niacin and related Rash 05/25/2022    Vitals: BP (!) 122/58   Pulse 70   Ht 5\' 7"  (1.702 m)   Wt 147 lb (66.7 kg)   BMI 23.02 kg/m  Last Weight:  Wt Readings from Last 1 Encounters:  12/14/22 147 lb (66.7 kg)   Last Height:   Ht Readings from Last 1 Encounters:  12/14/22 5\' 7"  (1.702 m)     Exam stable, no changes, repeated   Exam: NAD, pleasant                  Speech:    Speech is normal; fluent and spontaneous with normal  comprehension.  Cognition:    The patient is oriented to person, place, and time;     recent and remote memory intact;     language fluent;    Cranial Nerves:    The pupils are equal, round, and reactive to light.Trigeminal sensation is intact and the muscles of mastication are normal. The face is symmetric. The palate elevates in the midline. Hearing intact. Voice is normal. Shoulder shrug is normal. The tongue has normal motion without fasciculations.   Coordination:  No dysmetria  Motor Observation:    No asymmetry, no atrophy, and no involuntary movements noted. Tone:    Normal muscle tone.     Strength: Right arm weakness      Sensation: intact to LT  Gait: stoopped, low clearance,. Imblance on heel/toe/tandem. Low clearance. Decreased right arm swing  DTR: brisk     Assessment/Plan:   89 y.o. male here as a follow up by [name], MD for dizziness, imbalance.  He has a past medical history of hyperlipidemia, mitral valve repair, colon cancer, kidney stones, coronary artery bypass, atrial fibrillation.  Patient with a history of silent bilateral cerebellar strokes likely due to A. fib or post procedure 2013.  However he has been stable as far as strokes, recent MRI without new strokes and so since then with no further episodes of TIA  or strokelike symptoms.  Today and at  last appointment he reports episodes of falling backwards, imbalance, dizziness when he stands up, he did show me his list of blood pressures and his systolic is sometimes as low as 98 or 105.  His blood pressures dropped slightly but not orthostatic.  I advised him on staying hydrated and other compensatory measures for orthostatic hypotension.  a CT of the head showed nothing acute. Son is here with mr. Older today and provides much information  Increase fluids urine should be a clear light yellow  Dizziness improved with vestibular PT and epley maneuvers (positive dix hallpike) F/u 9 months to a year, he can go back to pcp but he does not want to stop coming here, that is fine he is lovely we can keep an eye on him as well  No orders of the defined types were placed in this encounter.     Cc: Noberto Retort, MD,  Noberto Retort, MD  Naomie Dean, MD  [facility] [address] [place], [place] [address]  Phone [phone] Fax [phone]  I spent over 30 minutes of face-to-face and non-face-to-face time with patient on the  1. Dizziness   2. Vertigo       diagnosis.  This included previsit chart review, lab review, study review, order entry, electronic health record documentation, patient education on the different diagnostic and therapeutic options, counseling and coordination of care, risks and benefits of management, compliance, or risk factor reduction

## 2023-06-20 ENCOUNTER — Ambulatory Visit: Payer: Medicare Other | Admitting: Neurology

## 2023-06-20 ENCOUNTER — Encounter: Payer: Self-pay | Admitting: Neurology

## 2023-06-20 VITALS — BP 136/72 | HR 69 | Ht 67.0 in | Wt 141.6 lb

## 2023-06-20 DIAGNOSIS — Z8673 Personal history of transient ischemic attack (TIA), and cerebral infarction without residual deficits: Secondary | ICD-10-CM | POA: Diagnosis not present

## 2023-06-20 DIAGNOSIS — R2689 Other abnormalities of gait and mobility: Secondary | ICD-10-CM | POA: Diagnosis not present

## 2023-06-20 DIAGNOSIS — R413 Other amnesia: Secondary | ICD-10-CM | POA: Diagnosis not present

## 2023-06-20 DIAGNOSIS — F4321 Adjustment disorder with depressed mood: Secondary | ICD-10-CM

## 2023-06-20 NOTE — Progress Notes (Unsigned)
 [id number]    Provider:  Dr Tresia Fruit Requesting Provider: Roselind Congo, MD Primary Care Provider:  Roselind Congo, MD  CC:  Stroke  06/20/2023: Here with his nice son Myrtie Atkinson. Will review documents for his long-term care policy I beieve we should enact it due to cognitive and physical decline. Benign positional vertigo resolved. He has fluid in his legs. Still moving the lawn, son is worried about dehydration. Still walks his dog but imbalance, fall precautions. Not taking enough fluid. Last here lbs 147 and today 141 he is back on lasix  for the swelling. He lives ineendently in his own home. He wants to stay in his home. He has 3 sisters who live in [facility] very expensive, he has a 17 year old sister. Isn't wearing hearing aids. Tried to encourage. Mild right prox arm wweakness due to shoulder pain. Other sister is 62 and 31 younger brother with alzheimers at 5 12 brothers and sisters.   No other focal neurologic deficits, associated symptoms, inciting events or modifiable factors. Patient complains of symptoms per HPI as well as the following symptoms: grief due to the passing of his wife . Pertinent negatives and positives per HPI. All others negative    12/14/2022: Here with son who provides information. Patient states Etta Heritage was very positive and vestibular therapy helped with epley maneuvers. Dizziness gone. His wife passed away right before his 49st birthday about a year ago. They were married 71 years. He says she never complained. Here with his son. Son bvisits him at [place] often. Son is here and provides much information. Also has a daughter. He lives by himself. No falls. No choking or coughing with food. BP is better, drinking more, his biggest is trying to get hearing aids. Met his wife when he was 56 and she was 13.5 and they married when he was 28 and she was 30. He doesn't wear his hearing aids and son says he struggles to have  his dad to wear.  Son is POA. He talks a lot about his grief and his wife. He went to grief therapy and is also involved with th church. Sleeps well. He says he gets through on "trust god no matter what" . Son has 3 kids. Daughter has 2 kids. He sees his grandchildren. He is tangential and slightly difficult to redirect, he perseverates on his wife, he is still grieving his wife, my heart goes out to him and his family.    Patient complains of symptoms per HPI as well as the following symptoms: grief, wife died . Pertinent negatives and positives per HPI. All others negative   Follow-up May 2024: This is a patient who is 89 years old who is here for follow-up.  We have seen him in the past for stroke last time he was seen was in December 2022. has CHF (congestive heart failure) (HCC); MR (mitral regurgitation); Cataract; Dermatochalasis; Hypercholesterolemia; Systolic murmur; Nephrolithiasis; PVC (premature ventricular contraction); Pseudophakia; Severe mitral regurgitation; Coronary artery disease; S/P mitral valve repair; S/P CABG x 2; Ventricular tachycardia (HCC); S/P MVR (mitral valve repair); S/P CABG (coronary artery bypass graft); Shoulder arthritis; Rotator cuff arthropathy of left shoulder; Cerebellar infarction (HCC); Syncope; Fall; Hypokalemia; History of colon cancer; Paroxysmal atrial fibrillation (HCC); Benign prostatic hyperplasia with lower urinary tract symptoms; and Chronic kidney disease (CKD), stage III (moderate) (HCC) on their problem list.  I reviewed notes from a Dr. Raquel Cables, patient reported worsened imbalance, cerebellar disease, had an  MRI of the brain completed.  Recent labs March 23, 2022 include normal thyroid , unremarkable CMP with BUN 22 and creatinine 0.86, unremarkable CBC with slightly low platelets at 146 and very minimal anemia hemoglobin 12.5.  He had an MRI 03/23/2022 because of imbalance and falling backwards and notes further go to explain that he had a recent episode  when he was sitting in the bed preparing to get up and he felt a sense of the force pushing and back, it happened at another time after a while he was able to get up out of bed since this began he has felt "wobbly" although he has had no further falls.  He denies true vertigo headache or nausea.  He does have a history of vertigo and stroke seen by me and chronic old small strokes in the cerebellum.  He has a history of A-fib on Eliquis . An MRi brain was ordered 12/5 but I do not see results on EPIC, I reviewed report that was on "Care Everywhere" and it appears nothing acute and no new strokes; no acute abnormality to explain his feelings of imbalance. Always happens in the morning. At night he gets in bed moving around.   Here with his son. He feels like he is spinning. It happens first thing in the morning when sit up on the edge of the bed. Happens with changing positons. He also feels imbalance, he has neck pain.Feels dizzy. Feels like someone is pulling him back. He has neck issues, pain in the stiff, stiffness in the neck, walking he feels imbalanced. Usually the dizziness is when changing positions. Not drinking a lot of fluids. He hasn't eaten or drank anything today, he doesn't drink a lot of water. He doesn't drink enough water. He is also taking a fluid pill. Doesn't urinate often. His BP is low 105 systolic. Discussed Drink more water, tea, or fluids. May ask cardiology to decrease the fluid pill, he states he does not get edema anymore.    Reviewed MRI brain images: MRI brain report 03/2021: EXAM:  MRI HEAD WITHOUT CONTRAST   TECHNIQUE:  Multiplanar, multiecho pulse sequences of the brain and surrounding  structures were obtained without intravenous contrast.   COMPARISON:  01/02/2017   FINDINGS:  Brain: No acute infarct, mass effect or extra-axial collection. No  acute or chronic hemorrhage. Minimal multifocal hyperintense  T2-weight signal within the white matter. Old punctate  cerebellar  infarcts are less clearly demonstrated on the current study. The  midline structures are normal.   Vascular: Major flow voids are preserved. Diminutive right vertebral  artery, unchanged   Skull and upper cervical spine: Normal calvarium and skull base.  Visualized upper cervical spine and soft tissues are normal.   Sinuses/Orbits:No paranasal sinus fluid levels or advanced mucosal  thickening. No mastoid or middle ear effusion. Normal orbits.   IMPRESSION:  1. No acute intracranial abnormality.  2. Old, tiny cerebellar infarcts   Patient complains of symptoms per HPI as well as the following symptoms: dizzy, imbalanced . Pertinent negatives and positives per HPI. All others negative   01/14/2021; No falls, no blackouts, his balance is better, he is extremely careful. No strokes. Doing well. Appetite is good, eating well, not choking, he has gained a few pounds recently his BMI is 23, his recent bmp normal, cbc unremarkable, no strokes or TIAs, he is still on eliquis  for stroke prevention, he is on B12, he went to physical therapy in early 2022 for imbalance, he improved. His  wife Braden Caddy is still going strong, she never complains, she is at [place]. He visits his wife 3-4x a day.   Interval history 01/02/2020: Patient is here today, he has a remote history of strokes and is on Eliquis  for afib. His wife has FTD and she can't walk and she is at [place]. He visits very often. She was taken off of hospice because she is stable, not decompensating. He feels he doesn't have balance. He is trying to drink more fluid, it helps with his positional dizziness, his blood pressure is not dipping as low as it was. No stroke symptoms. No falls, no moe passing out spells.  Patient saw Dr. Janett Medin who reviewed all his images as well. He sits with his wife all day, he does not exercise. He denies any depression (although he appears sad to me today)   HPI:  ANTWIAN SANTAANA is a 89  y.o. male here as a follow up by Roselind Congo, MD for stroke.  He has a past medical history of hyperlipidemia, mitral valve repair, colon cancer, kidney stones, coronary artery bypass, atrial fibrillation.  Patient has not had any clinical symptoms or strokes of TIA the last had an MRI in 2018; I reviewed images which showed remote bilateral cerebellar infarcts.  I reviewed prior notes from Dr. Janett Medin and reviewed images from MRI and agree with radiology notes and also MRI images which showed hypoplastic right vertebral artery but no significant carotid stenosis and he is on long-term anticoagulation for paroxysmal A. fib which initially started following CABG surgery in 2013; strokes could be due to A. fib or post procedure and he has been stable since.  He tolerates Eliquis  well.Aaron Aas   He is here today and he reports that he he goes to see his wife in [place] in hospice for Frontotemporal Dementia, he visits 2-3x a week. He feels well, no stroke symptoms or any new symptoms except he had a syncopal event. He has been overall happy with [facility] and hospice comes daily M-F to help with care. One Sunday after lunch he was standing and all of a sudden fell backwards. Not lightheaded, he lost balance unknown why, unknown why he fell backwards, unknown loss of consciousness, he denied feeling dizzy or seizure activity, no headaches, no vomiting, no chest, no problems since then. Lately he feels a little dizzy when he stands up or walking. He doesn't drink a lot fluids during the day. He shows me a list of his blood pressures and his systolic is sometimes as low as 98 or 105. He is a poor historian. He had a kidney stone not long ago. Dizziness started the last month.   Reviewed notes, labs and imaging from outside physicians, which showed:   CT head 07/25/2019: Personallyreviewed imaging and agree with the following:   The cortical sulci, fissures and cisterns are normal in size and appearance.   Lateral, third and fourth ventricle are normal in size and appearance.  No extra-axial fluid collections are seen.  No intracranial hemorrhage.  Calcifications in bilateral vertebral and internal carotid arteries. No evidence of mass effect or midline shift.     The orbits and their contents, paranasal sinuses and calvarium are unremarkable.       IMPRESSION:    Unremarkable CT head (without). No acute findings.  Patient complains of symptoms per HPI as well as the following symptoms: circumcision . Pertinent negatives and positives per HPI. All others negative  Social History   Socioeconomic History   Marital status: Married    Spouse name: Not on file   Number of children: 2   Years of education: Not on file   Highest education level: Not on file  Occupational History   Occupation: retired, Art gallery manager [employer]  Tobacco Use   Smoking status: Former    Types: Cigarettes   Smokeless tobacco: Never   Tobacco comments:    quit 80yrs ago; pt states he smoked only 3 months.  Vaping Use   Vaping status: Never Used  Substance and Sexual Activity   Alcohol  use: No   Drug use: No   Sexual activity: Yes  Other Topics Concern   Not on file  Social History Narrative   Lives alone    Right handed   Caffeine: 1 cup max in a day    Retired    Chief Executive Officer [employer] Strain: Not on file  Food Insecurity: Low Risk  (01/08/2022)   [facility]   Hunger Vital Sign    Worried About Running Out of Food in the Last Year: Never true    Within the past 12 months, the food you bought just didn't last and you didn't have money to get more: Not on file  Transportation Needs: No Transportation Needs (01/08/2022)   [facility]   Transportation    In the past 12 months, has lack of reliable transportation kept you from medical appointments, meetings, work or from getting things needed for daily living? : No   Physical Activity: Not on file  Stress: Not on file  Social Connections: Not on file  Intimate Partner Violence: Low Risk  (01/08/2022)   [facility] prior to 03/20/2022., [facility] prior to 03/20/2022.   Safety    How often does anyone, including family and friends, physically hurt you?: Never    How often does anyone, including family and friends, insult or talk down to you?: Never    How often does anyone, including family and friends, threaten you with harm?: Never    How often does anyone, including family and friends, scream or curse at you?: Never    Family History  Problem Relation Age of Onset   Cancer Mother    Heart disease Father    Heart attack Father    Glaucoma Sister    Prostate cancer Other        Fraternal HX    Stroke Neg Hx        none known    Past Medical History:  Diagnosis Date   Arthritis    hands   Cancer (HCC)    colon cancer 1984   Cataract    CHF (congestive heart failure) (HCC)    takes Lasix  daily   Coronary artery disease 08/10/2011   Dermatochalasis    Dislocated wrist    left   Diverticulosis    Dyslipidemia    takes Niacin  daily   GERD (gastroesophageal reflux disease)    sometimes d/t food   H/O hiatal hernia    History of colon polyps    History of kidney stones    History of seasonal allergies    takes OTC allergy meds prn   Hx: recurrent pneumonia    Hypercholesterolemia    Hyperlipidemia    MR (mitral regurgitation)    MVP (mitral valve prolapse)  Pneumonia    early July 2013   Pseudophakia    PVC (premature ventricular contraction)    S/P CABG x 2 08/19/2011   LIMA to LAD, SVG to RCA, EVH via left thigh   S/P mitral valve repair 08/19/2011   Complex valvuloplasty including triangular resection of posterior leaflet, artificial Goretex neocord placement x6 and 38mm Sorin Memo 3D ring annuloplasty   Shortness of breath    with  exertion/lying/sitting   Systolic murmur    Thyroid  nodule 08/18/2011   3 cm nodule discovered on chest CT scan   Urinary frequency    Urinary urgency     Patient Active Problem List   Diagnosis Date Noted   Hypokalemia 05/25/2022   History of colon cancer 05/25/2022   Paroxysmal atrial fibrillation (HCC) 05/25/2022   Benign prostatic hyperplasia with lower urinary tract symptoms 05/25/2022   Chronic kidney disease (CKD), stage III (moderate) (HCC) 05/25/2022   Syncope 07/23/2019   Fall 07/23/2019   Cerebellar infarction (HCC) 07/03/2018   Shoulder arthritis 12/13/2017   Rotator cuff arthropathy of left shoulder    S/P MVR (mitral valve repair) 10/11/2011   S/P CABG (coronary artery bypass graft) 10/11/2011   Ventricular tachycardia (HCC) 08/25/2011   S/P mitral valve repair 08/19/2011   S/P CABG x 2 08/19/2011   Severe mitral regurgitation 08/12/2011   CHF (congestive heart failure) (HCC)    MR (mitral regurgitation)    Cataract    Dermatochalasis    Hypercholesterolemia    Systolic murmur    Nephrolithiasis    PVC (premature ventricular contraction)    Pseudophakia    Coronary artery disease 08/10/2011    Past Surgical History:  Procedure Laterality Date   BUNIONECTOMY     CARDIAC CATHETERIZATION  08/10/2011   CATARACT EXTRACTION     bilateral   CIRCUMCISION  12/2019   COLONOSCOPY     CORONARY ARTERY BYPASS GRAFT  08/19/2011   Procedure: CORONARY ARTERY BYPASS GRAFTING (CABG);  Surgeon: Gardenia Jump, MD;  [facility] OR;  Service: Open Heart Surgery;  Laterality: N/A;  Coronary Artery Bypass Grafting times two using left internal mammary artery and left greater saphenous vein endoscopiclly harvested   CYSTOSCOPY     with manipulation of Ureteral Calculus   ESOPHAGOGASTRODUODENOSCOPY     with dilitation   HERNIA REPAIR     LITHOTRIPSY     Whole body extracorporeal shock wave   LITHOTRIPSY     Left ESL PUA GPE 01/03/2006,04/03/2006   MITRAL VALVE REPAIR   08/19/2011   Procedure: MITRAL VALVE REPAIR (MVR);  Surgeon: Gardenia Jump, MD;  Location: [facility] OR;  Service: Open Heart Surgery;  Laterality: N/A;   pace maker     [place] medication   pacemaker  12/2021   partial colecotomy  01/18/1982   PARTIAL COLECTOMY  1984   REVERSE SHOULDER ARTHROPLASTY Left 12/13/2017   Procedure: LEFT REVERSE SHOULDER ARTHROPLASTY;  Surgeon: Jasmine Mesi, MD;  Location: [facility] OR;  Service: Orthopedics;  Laterality: Left;    Current Outpatient Medications  Medication Sig Dispense Refill   apixaban  (ELIQUIS ) 5 MG TABS tablet Take 5 mg by mouth 2 (two) times daily.     aspirin  EC 81 MG tablet Take 81 mg by mouth every evening.      finasteride (PROSCAR) 5 MG tablet Take 5 mg by mouth daily.     hydroxypropyl methylcellulose / hypromellose (ISOPTO TEARS / GONIOVISC) 2.5 % ophthalmic solution Place 1 drop into both eyes as  needed for dry eyes.     KRILL OIL PO Take by mouth 2 (two) times daily.     Multiple Vitamins-Minerals (OCUVITE ADULT 50+ PO) Take by mouth daily.     No current facility-administered medications for this visit.    Allergies as of 06/20/2023 - Review Complete 06/20/2023  Allergen Reaction Noted   Hibiclens  [chlorhexidine  gluconate]  05/25/2022   Ivp dye [iodinated contrast media] Rash 08/12/2011   Niacin  and related Rash 05/25/2022    Vitals: BP 136/72   Pulse 69   Ht 5\' 7"  (1.702 m)   Wt 141 lb 9.6 oz (64.2 kg)   BMI 22.18 kg/m  Last Weight:  Wt Readings from Last 1 Encounters:  06/20/23 141 lb 9.6 oz (64.2 kg)   Last Height:   Ht Readings from Last 1 Encounters:  06/20/23 5\' 7"  (1.702 m)    Exam: NAD, pleasant                  Speech:    Speech is normal; fluent and spontaneous with normal comprehension.  Cognition:    The patient is oriented to person, place, and time;     recent and remote memory intact;     language fluent;    Cranial Nerves:    The pupils are equal, round, and reactive to light.Trigeminal  sensation is intact and the muscles of mastication are normal. The face is symmetric. The palate elevates in the midline. Hearing intact. Voice is normal. Shoulder shrug is normal. The tongue has normal motion without fasciculations.   Coordination:  No dysmetria  Motor Observation:    No asymmetry, no atrophy, and no involuntary movements noted. Tone:    Normal muscle tone.     Strength: slight right arm weakness due to shoulder injury chronic/ Strength is V/V in the upper and lower limbs.      Sensation: intact to LT  Gait: stooped slightly with low clearance of feet and imbalance on tandem and toe/heel with mild dec right arm swing  DTR: brisk     Assessment/Plan:   89 y.o. male here as a follow up by Elyn Han, MD for dizziness, imbalance.  He has a past medical history of hyperlipidemia, mitral valve repair, colon cancer, kidney stones, coronary artery bypass, atrial fibrillation.  Patient with a history of silent bilateral cerebellar strokes likely due to A. fib or post procedure 2013.  However he has been stable as far as strokes, follow up MRI without new strokes and so since then with no further episodes of TIA or strokelike symptoms.  Not today but at past appointments, he has reported episodes of falling backwards, imbalance, dizziness when he stands up, he did show me his list of blood pressures and his systolic was sometimes as low as 98 or 105 and we discussed with his son Myrtie Atkinson.  His blood pressures dropped slightly but not orthostatic.  I advised him on staying hydrated and other compensatory measures for orthostatic hypotension.  Son is here with mr. Lawhorn today and provides much information  Dizziness improved with vestibular PT and epley maneuvers (positive dix hallpike) and still improved although he has some mild gait disorder on exam so fall precautions  Lovely gentleman and family, stil grieving the loss of his wife who was also my patient.   Here with his nice  son Myrtie Atkinson. Will review documents for his long-term care policy I beieve we should enact it due to cognitive(brother with alzheimer's disease) and physical decline(gait).  Cc: Roselind Congo, MD,  Roselind Congo, MD  Aldona Amel, MD  [facility] [address] [place], [place] [address]  Phone [phone] Fax [phone]  I spent 25 minutes of face-to-face and non-face-to-face time with patient on the  1. Hx of ischemic vertebrobasilar artery cerebellar stroke   2. Imbalance   3. Memory loss   4. Grief        diagnosis.  This included previsit chart review, lab review, study review, order entry, electronic health record documentation, patient education on the different diagnostic and therapeutic options, counseling and coordination of care, risks and benefits of management, compliance, or risk factor reduction

## 2023-09-01 ENCOUNTER — Telehealth: Payer: Self-pay | Admitting: Neurology

## 2023-09-01 NOTE — Telephone Encounter (Signed)
 I spoke to [employer], to write an appeal for his denial of claim for long-term health care.   My name is not on the policy, they need permission from the policy holder to release information nothing on the policy that states I am the neurologist. Policy holder needs a written request to authorize information to be released to me to and then the care manager of the policy can discuss what is denied and what is the next step.   Send an email to [email or web address], needed is a signature of the policy holder or POA, fax [phone]   Once that is received, I can call back to schedule an appointment to speak to the care manage

## 2023-09-05 ENCOUNTER — Telehealth: Payer: Self-pay | Admitting: Neurology

## 2023-09-05 NOTE — Telephone Encounter (Signed)
 Needs appointment to evaluate cogniton, mmse and function for his long term care

## 2023-09-06 NOTE — Telephone Encounter (Signed)
 Can you call son and see if patient can come into the office next Thursday August 28th at 4pm please? If not I will find him another spot thank you !!  Son: [phone]

## 2023-09-08 NOTE — Telephone Encounter (Signed)
 Can you add him on 8/26 at 4pm please? Thank you !!

## 2023-09-08 NOTE — Telephone Encounter (Signed)
 LVM with patient's son informing him of appt scheduled with Dr Ines for 8/26 at 4pm

## 2023-09-09 NOTE — Telephone Encounter (Signed)
 Pt son called back to confirm they will be at Appt   Appt Confirmed

## 2023-09-13 ENCOUNTER — Ambulatory Visit: Admitting: Neurology

## 2023-09-13 ENCOUNTER — Other Ambulatory Visit: Payer: Self-pay | Admitting: Student

## 2023-09-13 ENCOUNTER — Encounter: Payer: Self-pay | Admitting: Neurology

## 2023-09-13 VITALS — BP 125/63 | HR 64 | Ht 67.0 in | Wt 147.0 lb

## 2023-09-13 DIAGNOSIS — F0390 Unspecified dementia without behavioral disturbance: Secondary | ICD-10-CM

## 2023-09-13 DIAGNOSIS — K921 Melena: Secondary | ICD-10-CM
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
## 2023-09-16 ENCOUNTER — Telehealth: Payer: Self-pay

## 2023-09-16 LAB — ATN PROFILE
A -- Beta-amyloid 42/40 Ratio: 0.1 — AB (ref >0.102)
Beta-amyloid 40: 220.12 pg/mL
Beta-amyloid 42: 22.01 pg/mL
N -- NfL, Plasma: 4.98 pg/mL (ref 0.00–9.13)
T -- p-tau181: 2.02 pg/mL — AB (ref 0.00–0.97)

## 2023-09-16 MED ORDER — PREDNISONE 50 MG PO TABS: ORAL_TABLET | ORAL | 0 refills | Status: AC

## 2023-09-16 NOTE — Telephone Encounter (Signed)
 Phone call to patient to review instructions for 13 hr prep for CT w/ contrast on 09/21/23  at 10:20AM. Prescription called into [facility]. Pt aware and verbalized understanding of instructions. Prescription: Pt to take 50 mg of prednisone  on 09/20/23 at 9:20PM, 50 mg of prednisone  on 09/21/23 at 3:20AM, and 50 mg of prednisone  on 09/21/23 at 9:20AM. Pt is also to take 50 mg of benadryl  on 09/21/23 at 9:20AM. Please call [phone] with any questions.

## 2023-09-20 ENCOUNTER — Encounter: Payer: Self-pay | Admitting: *Deleted

## 2023-09-20 ENCOUNTER — Telehealth: Payer: Self-pay | Admitting: *Deleted

## 2023-09-20 NOTE — Telephone Encounter (Signed)
 Letter done, signed.  To MR to call and let family know it is ready.

## 2023-09-21 ENCOUNTER — Telehealth: Payer: Self-pay | Admitting: Neurology

## 2023-09-21 ENCOUNTER — Ambulatory Visit
Admission: RE | Admit: 2023-09-21 | Discharge: 2023-09-21 | Disposition: A | Discharge status: Home / Self Care | Source: Ambulatory Visit | Attending: Student | Admitting: Student

## 2023-09-21 DIAGNOSIS — K921 Melena: Secondary | ICD-10-CM

## 2023-09-21 MED ORDER — IOPAMIDOL (ISOVUE-300) INJECTION 61%: 100.0000 mL | Freq: Once | INTRAVENOUS | Status: DC | PRN

## 2023-09-21 MED ORDER — IOPAMIDOL (ISOVUE-300) INJECTION 61%
100.0000 mL | Freq: Once | INTRAVENOUS | Status: AC | PRN
  Administered 2023-09-21: 100 mL via INTRAVENOUS

## 2023-09-21 NOTE — Telephone Encounter (Signed)
 Please send his letter to the following:  [facility]TC administration [address], [place] [address]  FOR POLICY NUMBER: [phone] (can write on letter)  FAX: [phone]  Also mail a copy to patient  This is time sensitive.

## 2023-09-27 ENCOUNTER — Telehealth: Payer: Self-pay | Admitting: Neurology

## 2023-09-27 NOTE — Telephone Encounter (Signed)
 [name] [name], the letter we sent to [name] [name] did not arrive last week. The son is asking for a call would you call him at [phone] please? Son would like a letter sent to him. And please inform him we did mail the letter. Thank you!

## 2023-09-27 NOTE — Telephone Encounter (Signed)
 Thank you !! You can mail it to the below address but still contact him thank you !!  [facility] [address] [place], [place] [address]  Thanks again

## 2023-09-28 ENCOUNTER — Telehealth: Payer: Self-pay | Admitting: *Deleted

## 2023-09-28 NOTE — Telephone Encounter (Signed)
 Dr Ines, pt called to say thank you for the letter it came in the mail today.

## 2023-12-13 ENCOUNTER — Ambulatory Visit: Admitting: Neurology

## 2023-12-20 ENCOUNTER — Ambulatory Visit: Admitting: Neurology
